# Patient Record
Sex: Female | Born: 1985 | Race: Black or African American | Hispanic: No | Smoking: Never smoker
Health system: Southern US, Community
[De-identification: ages and names within clinical notes are randomized; demographics above are authoritative.]

## PROBLEM LIST (undated history)

## (undated) ENCOUNTER — Inpatient Hospital Stay (HOSPITAL_COMMUNITY): Payer: Self-pay

## (undated) ENCOUNTER — Inpatient Hospital Stay (HOSPITAL_COMMUNITY): Payer: Medicaid Other

## (undated) DIAGNOSIS — F419 Anxiety disorder, unspecified: Secondary | ICD-10-CM

## (undated) DIAGNOSIS — F32A Depression, unspecified: Secondary | ICD-10-CM

## (undated) DIAGNOSIS — T1490XA Injury, unspecified, initial encounter: Secondary | ICD-10-CM

## (undated) DIAGNOSIS — R42 Dizziness and giddiness: Secondary | ICD-10-CM

## (undated) DIAGNOSIS — I10 Essential (primary) hypertension: Secondary | ICD-10-CM

## (undated) DIAGNOSIS — E039 Hypothyroidism, unspecified: Secondary | ICD-10-CM

## (undated) DIAGNOSIS — R87629 Unspecified abnormal cytological findings in specimens from vagina: Secondary | ICD-10-CM

## (undated) DIAGNOSIS — F329 Major depressive disorder, single episode, unspecified: Secondary | ICD-10-CM

## (undated) DIAGNOSIS — N920 Excessive and frequent menstruation with regular cycle: Secondary | ICD-10-CM

## (undated) DIAGNOSIS — R51 Headache: Secondary | ICD-10-CM

## (undated) DIAGNOSIS — S92901A Unspecified fracture of right foot, initial encounter for closed fracture: Secondary | ICD-10-CM

## (undated) DIAGNOSIS — O139 Gestational [pregnancy-induced] hypertension without significant proteinuria, unspecified trimester: Secondary | ICD-10-CM

## (undated) DIAGNOSIS — Z8742 Personal history of other diseases of the female genital tract: Secondary | ICD-10-CM

## (undated) DIAGNOSIS — Z8759 Personal history of other complications of pregnancy, childbirth and the puerperium: Secondary | ICD-10-CM

## (undated) DIAGNOSIS — S8291XA Unspecified fracture of right lower leg, initial encounter for closed fracture: Secondary | ICD-10-CM

## (undated) DIAGNOSIS — F4310 Post-traumatic stress disorder, unspecified: Secondary | ICD-10-CM

## (undated) HISTORY — DX: Injury, unspecified, initial encounter: T14.90XA

## (undated) HISTORY — DX: Post-traumatic stress disorder, unspecified: F43.10

## (undated) HISTORY — DX: Anxiety disorder, unspecified: F41.9

## (undated) HISTORY — PX: LAPAROSCOPIC GASTRIC SLEEVE RESECTION: SHX5895

## (undated) HISTORY — PX: TOOTH EXTRACTION: SUR596

## (undated) HISTORY — DX: Headache: R51

## (undated) HISTORY — DX: Depression, unspecified: F32.A

## (undated) HISTORY — PX: COMBINED LAPAROSCOPY W/ HYSTEROSCOPY: SUR299

## (undated) HISTORY — DX: Dizziness and giddiness: R42

## (undated) HISTORY — DX: Major depressive disorder, single episode, unspecified: F32.9

---
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
## 2005-03-15 ENCOUNTER — Emergency Department (HOSPITAL_COMMUNITY): Admission: EM | Admit: 2005-03-15 | Discharge: 2005-03-15 | Payer: Self-pay | Admitting: Emergency Medicine

## 2006-02-05 HISTORY — PX: DILATION AND CURETTAGE OF UTERUS: SHX78

## 2006-03-01 ENCOUNTER — Ambulatory Visit (HOSPITAL_COMMUNITY): Admission: RE | Admit: 2006-03-01 | Discharge: 2006-03-01 | Payer: Self-pay | Admitting: Obstetrics & Gynecology

## 2006-04-11 ENCOUNTER — Encounter (INDEPENDENT_AMBULATORY_CARE_PROVIDER_SITE_OTHER): Payer: Self-pay | Admitting: Specialist

## 2006-04-11 ENCOUNTER — Ambulatory Visit (HOSPITAL_COMMUNITY): Admission: RE | Admit: 2006-04-11 | Discharge: 2006-04-11 | Payer: Self-pay | Admitting: Obstetrics & Gynecology

## 2007-06-26 ENCOUNTER — Emergency Department (HOSPITAL_COMMUNITY): Admission: EM | Admit: 2007-06-26 | Discharge: 2007-06-26 | Payer: Self-pay | Admitting: Family Medicine

## 2007-07-04 ENCOUNTER — Emergency Department (HOSPITAL_COMMUNITY): Admission: EM | Admit: 2007-07-04 | Discharge: 2007-07-04 | Payer: Self-pay | Admitting: Emergency Medicine

## 2008-02-13 ENCOUNTER — Emergency Department (HOSPITAL_COMMUNITY): Admission: EM | Admit: 2008-02-13 | Discharge: 2008-02-13 | Payer: Self-pay | Admitting: Emergency Medicine

## 2008-07-21 ENCOUNTER — Emergency Department (HOSPITAL_COMMUNITY): Admission: EM | Admit: 2008-07-21 | Discharge: 2008-07-21 | Payer: Self-pay | Admitting: Family Medicine

## 2009-01-17 ENCOUNTER — Other Ambulatory Visit: Payer: Self-pay | Admitting: Emergency Medicine

## 2009-01-18 ENCOUNTER — Ambulatory Visit: Payer: Self-pay | Admitting: Psychiatry

## 2009-01-18 ENCOUNTER — Inpatient Hospital Stay (HOSPITAL_COMMUNITY): Admission: EM | Admit: 2009-01-18 | Discharge: 2009-01-21 | Payer: Self-pay | Admitting: Psychiatry

## 2009-02-06 ENCOUNTER — Other Ambulatory Visit: Payer: Self-pay | Admitting: Emergency Medicine

## 2009-02-06 ENCOUNTER — Inpatient Hospital Stay (HOSPITAL_COMMUNITY): Admission: AD | Admit: 2009-02-06 | Discharge: 2009-02-10 | Payer: Self-pay | Admitting: Psychiatry

## 2010-03-11 ENCOUNTER — Emergency Department (HOSPITAL_COMMUNITY): Payer: Medicaid Other

## 2010-03-11 ENCOUNTER — Emergency Department (HOSPITAL_COMMUNITY)
Admission: EM | Admit: 2010-03-11 | Discharge: 2010-03-12 | Disposition: A | Discharge status: Home / Self Care | Payer: Medicaid Other | Attending: Emergency Medicine | Admitting: Emergency Medicine

## 2010-03-11 DIAGNOSIS — S92309A Fracture of unspecified metatarsal bone(s), unspecified foot, initial encounter for closed fracture: Secondary | ICD-10-CM | POA: Insufficient documentation

## 2010-03-11 DIAGNOSIS — R51 Headache: Secondary | ICD-10-CM | POA: Insufficient documentation

## 2010-03-11 DIAGNOSIS — S60229A Contusion of unspecified hand, initial encounter: Secondary | ICD-10-CM | POA: Insufficient documentation

## 2010-03-11 DIAGNOSIS — F319 Bipolar disorder, unspecified: Secondary | ICD-10-CM | POA: Insufficient documentation

## 2010-03-11 DIAGNOSIS — S060X9A Concussion with loss of consciousness of unspecified duration, initial encounter: Secondary | ICD-10-CM | POA: Insufficient documentation

## 2010-04-23 LAB — COMPREHENSIVE METABOLIC PANEL
ALT: 24 U/L (ref 0–35)
AST: 30 U/L (ref 0–37)
Alkaline Phosphatase: 65 U/L (ref 39–117)
BUN: 4 mg/dL — ABNORMAL LOW (ref 6–23)
CO2: 24 mEq/L (ref 19–32)
Chloride: 107 mEq/L (ref 96–112)
Creatinine, Ser: 0.59 mg/dL (ref 0.4–1.2)
GFR calc Af Amer: >60 mL/min (ref >60)
Glucose, Bld: 103 mg/dL — ABNORMAL HIGH (ref 70–99)
Potassium: 3.6 mEq/L (ref 3.5–5.1)
Total Protein: 7.5 g/dL (ref 6.0–8.3)

## 2010-04-23 LAB — URINALYSIS, ROUTINE W REFLEX MICROSCOPIC
Protein, ur: 30 mg/dL — AB
pH: 6 (ref 5.0–8.0)

## 2010-04-23 LAB — RAPID URINE DRUG SCREEN, HOSP PERFORMED
Barbiturates: NOT DETECTED
Cocaine: NOT DETECTED

## 2010-04-23 LAB — DIFFERENTIAL
Basophils Absolute: 0.1 10*3/uL (ref 0.0–0.1)
Basophils Relative: 1 % (ref 0–1)
Eosinophils Absolute: 0.1 10*3/uL (ref 0.0–0.7)
Lymphs Abs: 1.8 10*3/uL (ref 0.7–4.0)
Monocytes Absolute: 0.8 10*3/uL (ref 0.1–1.0)
Neutro Abs: 6 10*3/uL (ref 1.7–7.7)
Neutrophils Relative %: 68 % (ref 43–77)

## 2010-04-23 LAB — CBC
Hemoglobin: 11.7 g/dL — ABNORMAL LOW (ref 12.0–15.0)
MCHC: 31.9 g/dL (ref 30.0–36.0)
Platelets: 328 10*3/uL (ref 150–400)

## 2010-04-23 LAB — POCT PREGNANCY, URINE: Preg Test, Ur: NEGATIVE

## 2010-04-23 LAB — URINE MICROSCOPIC-ADD ON

## 2010-04-23 LAB — TRICYCLICS SCREEN, URINE: TCA Scrn: NOT DETECTED

## 2010-04-24 ENCOUNTER — Emergency Department (HOSPITAL_COMMUNITY)
Admission: EM | Admit: 2010-04-24 | Discharge: 2010-04-25 | Disposition: A | Discharge status: Home / Self Care | Payer: Medicaid Other | Attending: Emergency Medicine | Admitting: Emergency Medicine

## 2010-04-24 ENCOUNTER — Emergency Department (HOSPITAL_COMMUNITY): Payer: Medicaid Other

## 2010-04-24 DIAGNOSIS — Z4789 Encounter for other orthopedic aftercare: Secondary | ICD-10-CM | POA: Insufficient documentation

## 2010-04-24 DIAGNOSIS — F319 Bipolar disorder, unspecified: Secondary | ICD-10-CM | POA: Insufficient documentation

## 2010-04-24 DIAGNOSIS — M79609 Pain in unspecified limb: Secondary | ICD-10-CM | POA: Insufficient documentation

## 2010-05-09 LAB — COMPREHENSIVE METABOLIC PANEL WITH GFR
ALT: 23 U/L (ref 0–35)
AST: 21 U/L (ref 0–37)
Albumin: 3.9 g/dL (ref 3.5–5.2)
Alkaline Phosphatase: 61 U/L (ref 39–117)
BUN: 7 mg/dL (ref 6–23)
CO2: 24 meq/L (ref 19–32)
Calcium: 9.4 mg/dL (ref 8.4–10.5)
Chloride: 104 meq/L (ref 96–112)
Creatinine, Ser: 0.51 mg/dL (ref 0.4–1.2)
GFR calc non Af Amer: >60 mL/min
Glucose, Bld: 97 mg/dL (ref 70–99)
Potassium: 3.5 meq/L (ref 3.5–5.1)
Sodium: 135 meq/L (ref 135–145)
Total Bilirubin: 0.8 mg/dL (ref 0.3–1.2)
Total Protein: 7.8 g/dL (ref 6.0–8.3)

## 2010-05-09 LAB — RAPID URINE DRUG SCREEN, HOSP PERFORMED
Amphetamines: NOT DETECTED
Barbiturates: NOT DETECTED
Benzodiazepines: NOT DETECTED
Cocaine: NOT DETECTED
Opiates: POSITIVE — AB
Tetrahydrocannabinol: NOT DETECTED

## 2010-05-09 LAB — URINALYSIS, ROUTINE W REFLEX MICROSCOPIC
Bilirubin Urine: NEGATIVE
Glucose, UA: NEGATIVE mg/dL
Hgb urine dipstick: NEGATIVE
Nitrite: NEGATIVE
Protein, ur: NEGATIVE mg/dL
Specific Gravity, Urine: 1.026 (ref 1.005–1.030)
Urobilinogen, UA: 1 mg/dL (ref 0.0–1.0)
pH: 6 (ref 5.0–8.0)

## 2010-05-09 LAB — ETHANOL

## 2010-05-09 LAB — CBC
HCT: 37.8 % (ref 36.0–46.0)
Hemoglobin: 12.1 g/dL (ref 12.0–15.0)
MCHC: 32 g/dL (ref 30.0–36.0)
MCV: 72.3 fL — ABNORMAL LOW (ref 78.0–100.0)
Platelets: 301 K/uL (ref 150–400)
RBC: 5.23 MIL/uL — ABNORMAL HIGH (ref 3.87–5.11)
RDW: 15.4 % (ref 11.5–15.5)
WBC: 7.6 K/uL (ref 4.0–10.5)

## 2010-05-09 LAB — DIFFERENTIAL
Basophils Absolute: 0 10*3/uL (ref 0.0–0.1)
Basophils Relative: 0 % (ref 0–1)
Eosinophils Absolute: 0.1 10*3/uL (ref 0.0–0.7)
Lymphocytes Relative: 21 % (ref 12–46)

## 2010-05-09 LAB — SALICYLATE LEVEL: Salicylate Lvl: <4 mg/dL (ref 2.8–20.0)

## 2010-05-09 LAB — ACETAMINOPHEN LEVEL

## 2010-05-09 LAB — TRICYCLICS SCREEN, URINE: TCA Scrn: NOT DETECTED

## 2010-05-09 LAB — PREGNANCY, URINE: Preg Test, Ur: NEGATIVE

## 2010-05-09 LAB — SYPHILIS: RPR W/REFLEX TO RPR TITER AND TREPONEMAL ANTIBODIES, TRADITIONAL SCREENING AND DIAGNOSIS ALGORITHM: RPR Ser Ql: NONREACTIVE

## 2010-05-15 LAB — WET PREP, GENITAL: Yeast Wet Prep HPF POC: NONE SEEN

## 2010-05-15 LAB — POCT PREGNANCY, URINE: Preg Test, Ur: NEGATIVE

## 2010-05-15 LAB — POCT URINALYSIS DIP (DEVICE)
Bilirubin Urine: NEGATIVE
Glucose, UA: NEGATIVE mg/dL

## 2010-06-23 NOTE — H&P (Signed)
Danielle [name], Danielle [name]               ACCOUNT NO.:  [id number]   MEDICAL RECORD NO.:  [id number]          PATIENT TYPE:  AMB   LOCATION:  SDC                           [facility]ACILITY:  [facility]   PHYSICIAN:  Roseanna Rainbow, M.D.DATE OF BIRTH:  07-Apr-1985   DATE OF ADMISSION:  DATE OF DISCHARGE:                              HISTORY & PHYSICAL   CHIEF COMPLAINT:  The patient is a 25 year old, para 0 with secondary  dysmenorrhea and a thickened endometrial stripe on ultrasound who  presents for [facility] hysteroscopy and diagnostic laparoscopy with possible  peritoneal biopsies.   HISTORY OF PRESENT ILLNESS:  Please see the above. Workup to date has  included a pelvic ultrasound on January 25 that again demonstrated the  endometrium to be heterogenous and measuring 17.3 mm.   ALLERGIES:  LATEX.   PAST MEDICAL HISTORY:  Seasonal allergies, eczema, migraine headaches,  urinary tract infections.   FAMILY HISTORY:  Noncontributory.   MEDICATIONS:  Please see the medication reconciliation form.   PAST SURGICAL HISTORY:  She denies.   REVIEW OF SYSTEMS:  GU:  Please see the above.   PHYSICAL EXAMINATION:  VITAL SIGNS:  Stable, afebrile.  GENERAL:  Well-developed, well-nourished, no apparent distress.  LUNGS: Clear to auscultation bilaterally.  HEART:  Regular rate and rhythm.  ABDOMEN:  Soft, nontender, no organomegaly.  PELVIC:  The external female genitalia are normal appearing. On speculum  exam, the vagina is clean, the cervix is without lesions. Bimanual exam,  the uterus is small, anteverted, nontender. The adnexa are without  masses, nontender bilaterally.  EXTREMITIES:  No clubbing, cyanosis or edema.  SKIN:  Without rash.   ASSESSMENT:  1. Secondary dysmenorrhea. Differential diagnosis includes possible      endometriosis.  2. Thickened heterogeneous endometrium. Differential diagnoses include      possible endometrial polyp, hyperplasia, very low suspicion for      neoplastic  process.   PLAN:  The planned procedures are diagnostic laparoscopy with possible  peritoneal biopsies and D&C hysteroscopy. The risks, benefits, and  alternative forms of management were reviewed with the patient and  informed consent had been obtained.      Roseanna Rainbow, M.D.  Electronically Signed     LAJ/MEDQ  D:  04/10/2006  T:  04/10/2006  Job:  [id number]

## 2010-06-23 NOTE — Discharge Summary (Signed)
NAME:  Danielle [name], Danielle [name]                         ACCOUNT NO.:  [id number]   MEDICAL RECORD NO.:  [id number]                   PATIENT TYPE:  IPS   LOCATION:  0107                                 [facility]   PHYSICIAN:  Cindie Crumbly, MD                 DATE OF BIRTH:  07-09-1985   DATE OF ADMISSION:  09/22/2001  DATE OF DISCHARGE:  09/25/2001                                 DISCHARGE SUMMARY   REASON FOR ADMISSION:  This 25 year old African-American female was admitted  complaining of depression status post overdose with 35 Naprosyn tablets as a  suicide attempt.  For further history of present illness, please see the  patient's psychiatric admission assessment.   PHYSICAL EXAMINATION:  Physical examination at the time of admission was  remarkable only for a history of irregular menses and migraine headaches.  She had an otherwise unremarkable physical examination.   LABORATORY DATA:  TSH was 1.114, T3 uptake was 42.3.  Urine probe for  gonorrhea and Chlamydia are pending at the time of discharge.  CBC showed  hemoglobin 10.9, hematocrit 32.0, MCV 71.3, and an unremarkable  differential.  Basic metabolic panel showed BUN 5 and was otherwise  unremarkable.  Hepatic panel was within normal limits.  GGT was within  normal limits.  Urine drug screen was negative.  Urine pregnancy test was  negative.  UA was unremarkable.  RPR was nonreactive.  EKG showed an  nonspecific ST-T wave changes and was otherwise unremarkable.  The patient  received no x-rays, no special procedures, no additional consultation.  She  sustained no complications during the course of this hospitalization.   HOSPITAL COURSE:  The patient was offered a trial of antidepressant  medication but both she and her mother refused any antidepressant medicines.  At the time of discharge and throughout her hospital course, she has denied  any homicidal or suicidal ideation, her affect and mood have improved over  the  course of the past several days.  On admission, she was depressed and  irritable.  At the time of discharge, she is actively participating in all  aspects of the therapeutic treatment program, is motivated for outpatient  therapy, and consequently is felt to have reached her maximum benefits of  hospitalization and is ready for discharge to a less restrictive alternative  setting.   CONDITION ON DISCHARGE:  Her condition on discharge is improved.   DIAGNOSES:  Diagnoses according to DSM-IV:  Axis I:     1. Major depression, single episode, severe without psychosis.  1. Rule out posttraumatic stress disorder.  Axis II:    1. Rule out personality disorder, not otherwise specified.  1. Rule out learning disorder, not otherwise specified.  Axis III:   1. Migraine headaches.  1. History of allergic rhinitis.  2. Rule out iron-deficiency anemia.  Axis IV:    Current psychosocial stressors are severe.  Axis V:  20 on admission, 30 on discharge.   FURTHER EVALUATION AND TREATMENT RECOMMENDATIONS:  1. The patient is discharged to home.  2. She is discharged on an unrestricted level of activity and a regular     diet.  3. She will follow-up with her primary care physician for a workup to rule     out iron-deficiency anemia.  4.     She will follow-up with her outpatient therapist, Dr. Judithann Sauger, for     all further aspects of her psychiatric care and consequently, I will sign     off on the case at this time.  5. The patient is discharged on no medications.                                                Cindie Crumbly, MD    TS/MEDQ  D:  09/25/2001  T:  09/27/2001  Job:  [id number]
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
## 2010-06-23 NOTE — Op Note (Signed)
[name], Danielle               ACCOUNT NO.:  [id number]   MEDICAL RECORD NO.:  [id number]          PATIENT TYPE:  AMB   LOCATION:  SDC                           [facility]  [facility]   PHYSICIAN:  Roseanna Rainbow, M.D.DATE OF BIRTH:  1985-04-12   DATE OF PROCEDURE:  04/11/2006  DATE OF DISCHARGE:                               OPERATIVE REPORT   PREOPERATIVE DIAGNOSES:  1. Secondary dysmenorrhea.  2. Thickened endometrial stripe on ultrasound.   POSTOPERATIVE DIAGNOSES:  1. Secondary dysmenorrhea.  2. Thickened endometrial stripe on ultrasound.  3. Endometriosis.   PROCEDURE:  Diagnostic laparoscopy with peritoneal biopsies, diagnostic  hysteroscopy, and dilatation and curettage.   SURGEON:  Roseanna Rainbow, M.D.   ANESTHESIA:  General endotracheal.   PATHOLOGY:  Peritoneal biopsies, endometrial curettings.   ESTIMATED BLOOD LOSS:  Minimal.   COMPLICATIONS:  None.   FINDINGS:  Upon survey of the abdominal-pelvic viscera at laparoscopy,  the appendix was normal-appearing.  The anterior cul-de-sac and ovarian  fossae were normal-appearing as well; however, upon visualization of the  posterior cul-de-sac, the left uterosacral ligament was involved in  dense scarring.  There was also scarring of the posterior cul-de-sac.  There was scant blood pooling in the posterior cul-de-sac as well as  what appeared to be endometrial tissue.  Several biopsies were taken of  the posterior cul-de-sac peritoneum.  Upon inspection of the endometrial  cavity at the time of hysteroscopy, the endometrium appeared very lush.  The right tubal ostium was well-visualized.  The left tubal ostium could  not be seen well.  There were no discrete lesions.   PROCEDURES:  The patient was taken to the operating room with an IV  running.  She was given general anesthesia and placed in the dorsal  lithotomy position and prepped and draped in the usual sterile fashion.  An infraumbilical skin  incision was then made with the scalpel.  The  Veress needle was then introduced into the abdomen while tenting up the  anterior abdominal wall at a 45 degree angle.  Intra-abdominal placement  was confirmed by a saline drop test and an appropriate low pressure  reading upon insufflation of the abdomen with CO2 gas.  The Veress  needle was then removed.  A trocar and sleeve was then advanced into the  abdomen, where appropriate placement was confirmed with the laparoscope.  The above findings were noted.  The peritoneal biopsies were taken.  The  instruments were then removed from the abdomen.  The skin was  reapproximated with interrupted sutures of 3-0 Vicryl and Dermabond.  Please note that prior to this portion of the procedure, the Hulka  manipulator had been placed into the uterine cavity and secured to the  anterior lip of the cervix as a means to manipulate the uterus.  This  was then removed.  A speculum was placed in the patient's vagina.  The  anterior lip of the cervix was grasped with a single-tooth tenaculum.  The cervix was then dilated with [facility] dilators.  A diagnostic  hysteroscopy was then performed with the above findings noted.  This was  followed by sharp curettage.  Moderate curettings were retrieved.  The  single-tooth tenaculum was then removed from the cervix.  The speculum  was removed from the vagina.  At the close of the procedure the  instrument and pack counts were said to be correct x2.  The patient was  taken to the PACU awake and in stable condition.      Roseanna Rainbow, M.D.  Electronically Signed     LAJ/MEDQ  D:  04/11/2006  T:  04/11/2006  Job:  [id number]

## 2010-09-07 ENCOUNTER — Emergency Department (HOSPITAL_COMMUNITY): Payer: Medicaid Other

## 2010-09-07 ENCOUNTER — Emergency Department (HOSPITAL_COMMUNITY)
Admission: EM | Admit: 2010-09-07 | Discharge: 2010-09-07 | Payer: Medicaid Other | Source: Home / Self Care | Attending: Emergency Medicine | Admitting: Emergency Medicine

## 2010-09-07 DIAGNOSIS — R109 Unspecified abdominal pain: Secondary | ICD-10-CM | POA: Insufficient documentation

## 2010-09-07 DIAGNOSIS — F411 Generalized anxiety disorder: Secondary | ICD-10-CM | POA: Insufficient documentation

## 2010-09-07 DIAGNOSIS — Z79899 Other long term (current) drug therapy: Secondary | ICD-10-CM | POA: Insufficient documentation

## 2010-09-07 DIAGNOSIS — F319 Bipolar disorder, unspecified: Secondary | ICD-10-CM | POA: Insufficient documentation

## 2010-09-07 DIAGNOSIS — IMO0002 Reserved for concepts with insufficient information to code with codable children: Secondary | ICD-10-CM | POA: Insufficient documentation

## 2010-09-07 DIAGNOSIS — M542 Cervicalgia: Secondary | ICD-10-CM | POA: Insufficient documentation

## 2010-09-07 LAB — CBC
MCH: 22.5 pg — ABNORMAL LOW (ref 26.0–34.0)
MCV: 68.2 fL — ABNORMAL LOW (ref 78.0–100.0)
Platelets: 295 10*3/uL (ref 150–400)
RDW: 15.6 % — ABNORMAL HIGH (ref 11.5–15.5)
WBC: 9.4 10*3/uL (ref 4.0–10.5)

## 2010-09-07 LAB — COMPREHENSIVE METABOLIC PANEL
ALT: 24 U/L (ref 0–35)
Alkaline Phosphatase: 84 U/L (ref 39–117)
BUN: 4 mg/dL — ABNORMAL LOW (ref 6–23)
CO2: 22 mEq/L (ref 19–32)
GFR calc Af Amer: >60 mL/min (ref >60)
GFR calc non Af Amer: >60 mL/min (ref >60)
Glucose, Bld: 100 mg/dL — ABNORMAL HIGH (ref 70–99)
Potassium: 3.2 mEq/L — ABNORMAL LOW (ref 3.5–5.1)
Sodium: 139 mEq/L (ref 135–145)
Total Bilirubin: 0.4 mg/dL (ref 0.3–1.2)

## 2010-09-07 LAB — URINALYSIS, ROUTINE W REFLEX MICROSCOPIC
Bilirubin Urine: NEGATIVE
Leukocytes, UA: NEGATIVE
Nitrite: NEGATIVE
Specific Gravity, Urine: 1.007 (ref 1.005–1.030)
pH: 6.5 (ref 5.0–8.0)

## 2010-09-07 LAB — DIFFERENTIAL
Basophils Absolute: 0 10*3/uL (ref 0.0–0.1)
Lymphs Abs: 2.3 10*3/uL (ref 0.7–4.0)
Monocytes Absolute: 0.8 10*3/uL (ref 0.1–1.0)
Monocytes Relative: 9 % (ref 3–12)
Neutrophils Relative %: 67 % (ref 43–77)

## 2010-09-07 LAB — POCT PREGNANCY, URINE: Preg Test, Ur: NEGATIVE

## 2010-09-07 LAB — URINE MICROSCOPIC-ADD ON

## 2010-09-07 LAB — RAPID URINE DRUG SCREEN, HOSP PERFORMED: Opiates: NOT DETECTED

## 2010-09-08 ENCOUNTER — Inpatient Hospital Stay (HOSPITAL_COMMUNITY)
Admission: AD | Admit: 2010-09-08 | Discharge: 2010-09-14 | DRG: 885 | Disposition: A | Discharge status: Home / Self Care | Payer: Medicaid Other | Attending: Psychiatry | Admitting: Psychiatry

## 2010-09-08 DIAGNOSIS — N809 Endometriosis, unspecified: Secondary | ICD-10-CM

## 2010-09-08 DIAGNOSIS — Z91040 Latex allergy status: Secondary | ICD-10-CM

## 2010-09-08 DIAGNOSIS — F411 Generalized anxiety disorder: Secondary | ICD-10-CM

## 2010-09-08 DIAGNOSIS — F39 Unspecified mood [affective] disorder: Principal | ICD-10-CM

## 2010-09-08 DIAGNOSIS — IMO0002 Reserved for concepts with insufficient information to code with codable children: Secondary | ICD-10-CM

## 2010-09-09 DIAGNOSIS — F259 Schizoaffective disorder, unspecified: Secondary | ICD-10-CM

## 2010-09-09 DIAGNOSIS — F319 Bipolar disorder, unspecified: Secondary | ICD-10-CM
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
## 2010-11-01 LAB — POCT I-STAT, CHEM 8
BUN: 6
Calcium, Ion: 1.22
Chloride: 106
Creatinine, Ser: 0.6
Glucose, Bld: 90
HCT: 38
Hemoglobin: 12.9
Potassium: 3.6
Sodium: 140
TCO2: 24

## 2011-01-04 ENCOUNTER — Encounter: Payer: Self-pay | Admitting: Emergency Medicine

## 2011-01-04 ENCOUNTER — Emergency Department (HOSPITAL_COMMUNITY)
Admission: EM | Admit: 2011-01-04 | Discharge: 2011-01-04 | Disposition: A | Discharge status: Home / Self Care | Payer: Medicaid Other | Source: Home / Self Care | Attending: Family Medicine | Admitting: Family Medicine

## 2011-01-04 DIAGNOSIS — N739 Female pelvic inflammatory disease, unspecified: Secondary | ICD-10-CM

## 2011-01-04 LAB — POCT URINALYSIS DIP (DEVICE)
Nitrite: NEGATIVE
Protein, ur: 30 mg/dL — AB
Specific Gravity, Urine: 1.025 (ref 1.005–1.030)
Urobilinogen, UA: 1 mg/dL (ref 0.0–1.0)

## 2011-01-04 LAB — WET PREP, GENITAL

## 2011-01-04 MED ORDER — CEFTRIAXONE SODIUM 250 MG IJ SOLR
250.0000 mg | Freq: Once | INTRAMUSCULAR | Status: AC
  Administered 2011-01-04: 250 mg via INTRAMUSCULAR

## 2011-01-04 MED ORDER — CEFTRIAXONE SODIUM 250 MG IJ SOLR
INTRAMUSCULAR | Status: AC
  Filled 2011-01-04: qty 250

## 2011-01-04 MED ORDER — METRONIDAZOLE 500 MG PO TABS: 500.0000 mg | ORAL_TABLET | Freq: Two times a day (BID) | ORAL | Status: DC

## 2011-01-04 MED ORDER — DOXYCYCLINE HYCLATE 100 MG PO TABS: 100.0000 mg | ORAL_TABLET | Freq: Two times a day (BID) | ORAL | Status: AC

## 2011-01-04 NOTE — ED Notes (Signed)
Pt here with vag yellow d/c with odor and lower abdominal cramping that started x 3wks ago.pt also reports to spotting intermitt.lmp 12/06/10

## 2011-01-04 NOTE — ED Provider Notes (Signed)
History     CSN: [id number] Arrival date & time: 01/04/2011 10:03 AM   First MD Initiated Contact with Patient 01/04/11 1013      No chief complaint on file.   (Consider location/radiation/quality/duration/timing/severity/associated sxs/prior treatment) HPI Comments: Danielle [name] presents for evaluation for vaginal pain, discharge, abdominal pain. She reports an unprotected sexual encounter several weeks ago with a known acquaintance but new sexual partner. She also reports a hx of endometriosis with abdominal pain but reports that this is different pain. She reports internal vaginal pain.  Patient is a 25 y.o. female presenting with female genitourinary complaint. The history is provided by the patient.  Female GU Problem Primary symptoms include discharge, pelvic pain, dyspareunia and genital pain.  Primary symptoms include no genital lesions and no genital rash. There has been no fever. This is a new problem. The current episode started more than 1 week ago. The problem occurs constantly. The problem has not changed since onset.She has missed her period. Her LMP is unknown. The patient's menstrual history has been irregular. The discharge was white. Associated symptoms include abdominal pain and nausea. Pertinent negatives include no constipation, no diarrhea, no vomiting and no frequency. She has tried nothing for the symptoms. Sexual activity: sexually active. There is a concern regarding sexually transmitted diseases. She uses nothing for contraception.    No past medical history on file.  No past surgical history on file.  No family history on file.  History  Substance Use Topics  . Smoking status: Not on file  . Smokeless tobacco: Not on file  . Alcohol Use: Not on file    OB History    No data available      Review of Systems  Constitutional: Negative.   HENT: Negative.   Eyes: Negative.   Respiratory: Negative.   Cardiovascular: Negative.   Gastrointestinal: Positive for  nausea and abdominal pain. Negative for vomiting, diarrhea and constipation.  Genitourinary: Positive for menstrual problem, pelvic pain and dyspareunia. Negative for frequency, hematuria, flank pain and genital sores.  Musculoskeletal: Negative.   Skin: Negative.     Allergies  Review of patient's allergies indicates not on file.  Home Medications  No current outpatient prescriptions on file.  BP 146/77  Pulse 89  Temp(Src) 98.3 F (36.8 C) (Oral)  Resp 18  SpO2 100%  Physical Exam  Nursing note and vitals reviewed. Constitutional: She is oriented to person, place, and time. She appears well-developed and well-nourished.  HENT:  Head: Normocephalic and atraumatic.  Eyes: EOM are normal.  Neck: Normal range of motion.  Pulmonary/Chest: Effort normal.  Genitourinary: Pelvic exam was performed with patient prone. There is no rash, tenderness or lesion on the right labia. There is no rash, tenderness or lesion on the left labia. Uterus is deviated. Cervix exhibits motion tenderness. Cervix exhibits no discharge and no friability. Right adnexum displays no mass, no tenderness and no fullness. Left adnexum displays tenderness. Left adnexum displays no mass and no fullness. There is tenderness around the vagina. No erythema or bleeding around the vagina. No foreign body around the vagina. No signs of injury around the vagina. Vaginal discharge found.  Neurological: She is alert and oriented to person, place, and time.  Skin: Skin is warm and dry.    ED Course  Procedures (including critical care time)  Labs Reviewed  POCT URINALYSIS DIP (DEVICE) - Abnormal; Notable for the following:    Hgb urine dipstick TRACE (*)    pH 8.5 (*)  Protein, ur 30 (*)    Leukocytes, UA LARGE (*) Biochemical Testing Only. Please order routine urinalysis from main lab if confirmatory testing is needed.   All other components within normal limits  POCT PREGNANCY, URINE  POCT URINALYSIS DIPSTICK    POCT PREGNANCY, URINE  GC/CHLAMYDIA PROBE AMP, GENITAL  WET PREP, GENITAL   No results found.   No diagnosis found.    MDM  Labs reviewed; GC and chlamydia pending        Richardo Priest, MD 01/04/11 1226

## 2011-01-05 LAB — GC/CHLAMYDIA PROBE AMP, GENITAL: GC Probe Amp, Genital: NEGATIVE

## 2011-01-05 NOTE — ED Notes (Signed)
Labs and medications reviewed. Pt. adequately treated with Flagyl. Message sent to Dr. Juanetta Gosling about any further tx. needed for yeast. Vassie Moselle ,01/05/2011

## 2011-01-08 ENCOUNTER — Telehealth (HOSPITAL_COMMUNITY): Payer: Self-pay | Admitting: *Deleted

## 2011-01-09 ENCOUNTER — Telehealth (HOSPITAL_COMMUNITY): Payer: Self-pay | Admitting: *Deleted

## 2011-02-26 ENCOUNTER — Ambulatory Visit (HOSPITAL_COMMUNITY): Payer: Medicaid Other | Admitting: Psychology

## 2011-03-07 ENCOUNTER — Ambulatory Visit (HOSPITAL_COMMUNITY): Payer: Medicaid Other | Admitting: Psychology

## 2011-05-16 ENCOUNTER — Inpatient Hospital Stay (HOSPITAL_COMMUNITY)
Admission: AD | Admit: 2011-05-16 | Discharge: 2011-05-16 | Disposition: A | Discharge status: Home / Self Care | Payer: Medicaid Other | Source: Ambulatory Visit | Attending: Obstetrics & Gynecology | Admitting: Obstetrics & Gynecology

## 2011-05-16 ENCOUNTER — Encounter (HOSPITAL_COMMUNITY): Payer: Self-pay | Admitting: *Deleted

## 2011-05-16 DIAGNOSIS — N803 Endometriosis of pelvic peritoneum, unspecified: Secondary | ICD-10-CM | POA: Insufficient documentation

## 2011-05-16 DIAGNOSIS — R109 Unspecified abdominal pain: Secondary | ICD-10-CM | POA: Insufficient documentation

## 2011-05-16 DIAGNOSIS — N809 Endometriosis, unspecified: Secondary | ICD-10-CM

## 2011-05-16 DIAGNOSIS — N938 Other specified abnormal uterine and vaginal bleeding: Secondary | ICD-10-CM | POA: Insufficient documentation

## 2011-05-16 DIAGNOSIS — N949 Unspecified condition associated with female genital organs and menstrual cycle: Secondary | ICD-10-CM | POA: Insufficient documentation

## 2011-05-16 LAB — URINE MICROSCOPIC-ADD ON

## 2011-05-16 LAB — URINALYSIS, ROUTINE W REFLEX MICROSCOPIC
Glucose, UA: NEGATIVE mg/dL
Protein, ur: NEGATIVE mg/dL
Urobilinogen, UA: 0.2 mg/dL (ref 0.0–1.0)

## 2011-05-16 MED ORDER — NAPROXEN 500 MG PO TABS: 500.0000 mg | ORAL_TABLET | Freq: Two times a day (BID) | ORAL | Status: AC | PRN

## 2011-05-16 MED ORDER — KETOROLAC TROMETHAMINE 60 MG/2ML IM SOLN
60.0000 mg | Freq: Once | INTRAMUSCULAR | Status: AC
  Administered 2011-05-16: 60 mg via INTRAMUSCULAR
  Filled 2011-05-16: qty 2

## 2011-05-16 NOTE — MAU Provider Note (Signed)
History     CSN: [id number]  Arrival date and time: 05/16/11 1710   First Provider Initiated Contact with Patient 05/16/11 1813      Chief Complaint  Patient presents with  . Vaginal Bleeding  . Abdominal Pain   HPI This is a 26 year old female with a history of endometriosis. The patient has been evaluated in her gynecologist's office in the past for this. She began having severe cramps and vaginal bleeding last night which she states its worst it's been in the past several months. The pain is located in her pelvis centrally and to the sides. Heat improves the pain. Lying on her stomach makes the pain worse. She reportedly called her doctor's office who instructed her to come to the MAU for evaluation. She denies vaginal discharge.  OB History    Grav Para Term Preterm Abortions TAB SAB Ect Mult Living   1 1 1  0 0 0 0 0 0 1      Past Medical History  Diagnosis Date  . Endometriosis     Past Surgical History  Procedure Date  . Dilation and curettage of uterus 2008  . Vaginal delivery     X 1    Family History  Problem Relation Age of Onset  . Anesthesia problems Neg Hx     History  Substance Use Topics  . Smoking status: Never Smoker   . Smokeless tobacco: Never Used  . Alcohol Use: Yes     occas. social    Allergies: No Known Allergies  No prescriptions prior to admission    Review of Systems  Constitutional: Negative for fever and chills.  Gastrointestinal: Negative for nausea, vomiting, diarrhea, constipation and blood in stool.  Genitourinary: Negative for dysuria, urgency, frequency and hematuria.   Physical Exam   Blood pressure 131/79, pulse 93, temperature 97.6 F (36.4 C), temperature source Oral, resp. rate 16, height 5' 4.5" (1.638 m), weight 108.228 kg (238 lb 9.6 oz), last menstrual period 05/15/2011, SpO2 100.00%.  Physical Exam  Constitutional: She is oriented to person, place, and time. She appears well-developed and well-nourished.    HENT:  Head: Normocephalic and atraumatic.  GI: Soft. She exhibits no distension and no mass. There is no tenderness. There is no rebound and no guarding.  Neurological: She is alert and oriented to person, place, and time.  Skin: Skin is warm and dry. No rash noted. No erythema. No pallor.  Psychiatric: She has a normal mood and affect. Her behavior is normal. Judgment and thought content normal.   Results for orders placed during the hospital encounter of 05/16/11 (from the past 24 hour(s))  URINALYSIS, ROUTINE W REFLEX MICROSCOPIC     Status: Abnormal   Collection Time   05/16/11  5:35 PM      Component Value Range   Color, Urine YELLOW  YELLOW    APPearance CLEAR  CLEAR    Specific Gravity, Urine >1.030 (*) 1.005 - 1.030    pH 6.0  5.0 - 8.0    Glucose, UA NEGATIVE  NEGATIVE (mg/dL)   Hgb urine dipstick LARGE (*) NEGATIVE    Bilirubin Urine NEGATIVE  NEGATIVE    Ketones, ur NEGATIVE  NEGATIVE (mg/dL)   Protein, ur NEGATIVE  NEGATIVE (mg/dL)   Urobilinogen, UA 0.2  0.0 - 1.0 (mg/dL)   Nitrite NEGATIVE  NEGATIVE    Leukocytes, UA NEGATIVE  NEGATIVE   URINE MICROSCOPIC-ADD ON     Status: Abnormal   Collection Time  05/16/11  5:35 PM      Component Value Range   Squamous Epithelial / LPF RARE  RARE    WBC, UA 0-2  <3 (WBC/hpf)   RBC / HPF 21-50  <3 (RBC/hpf)   Bacteria, UA FEW (*) RARE    Urine-Other MUCOUS PRESENT    POCT PREGNANCY, URINE     Status: Normal   Collection Time   05/16/11  6:07 PM      Component Value Range   Preg Test, Ur NEGATIVE  NEGATIVE      MAU Course  Procedures  MDM Patient given 1 dose of Toradol 60mg  IM with good reduction of her pain.  No evidence of UTI or pregnancy.  Assessment and Plan  1.  Endometriosis Gave prescription of naprosyn bid prn pain with instructions to follow up with [facility] if symptoms continue.  Kniyah Khun JEHIEL 05/16/2011, 6:14 PM

## 2011-05-16 NOTE — Discharge Instructions (Signed)
Endometriosis Endometriosis is a disease that occurs when the endometrium (lining of the uterus) is misplaced outside of its normal location. It may occur in many locations close to the uterus (womb), but commonly on the ovaries, fallopian tubes, vagina (birth canal) and bowel located close to the uterus. Because the uterus sloughs (expels) its lining every month (menses), there is bleeding whereever the endometrial tissue is located. SYMPTOMS  Often there are no symptoms. However, because blood is irritating to tissues not normally exposed to it, when symptoms occur they vary with the location of the misplaced endometrium. Symptoms often include back and abdominal pain. Periods may be heavier and intercourse may be painful. Infertility may be present. You may have all of these symptoms at one time or another or you may have months with no symptoms at all. Although the symptoms occur mainly during menses, they can occur mid-cycle as well, and usually terminate with menopause. DIAGNOSIS  Your caregiver may recommend a blood test and urine test (urinalysis) to help rule out other conditions. Another common test is ultrasound, a painless procedure that uses sound waves to make a sonogram "picture" of abnormal tissue that could be endometriosis. If your bowel movements are painful around your periods, your caregiver may advise a barium enema (an X-ray of the lower bowel), to try to find the source of your pain. This is sometimes confirmed by laparoscopy. Laparoscopy is a procedure where your caregiver looks into your abdomen with a laparoscope (a small pencil sized telescope). Your caregiver may take a tiny piece of tissue (biopsy) from any abnormal tissue to confirm or document your problem. These tissues are sent to the lab and a pathologist looks at them under the microscope to give a microscopic diagnosis. TREATMENT  Once the diagnosis is made, it can be treated by destruction of the misplaced endometrial  tissue using heat (diathermy), laser, cutting (excision), or chemical means. It may also be treated with hormonal therapy. When using hormonal therapy menses are eliminated, therefore eliminating the monthly exposure to blood by the misplaced endometrial tissue. Only in severe cases is it necessary to perform a hysterectomy with removal of the tubes, uterus and ovaries. HOME CARE INSTRUCTIONS   Only take over-the-counter or prescription medicines for pain, discomfort, or fever as directed by your caregiver.   Avoid activities that produce pain, including a physical sexual relationship.   Do not take aspirin as this may increase bleeding when not on hormonal therapy.   See your caregiver for pain or problems not controlled with treatment.  SEEK IMMEDIATE MEDICAL CARE IF:   Your pain is severe and is not responding to pain medicine.   You develop severe nausea and vomiting, or you cannot keep foods down.   Your pain localizes to the right lower part of your abdomen (possible appendicitis).   You have swelling or increasing pain in the abdomen.   You have a fever.   You see blood in your stool.  MAKE SURE YOU:   Understand these instructions.   Will watch your condition.   Will get help right away if you are not doing well or get worse.  Document Released: 01/20/2000 Document Revised: 01/11/2011 Document Reviewed: 09/10/2007 [employer] Patient Information 2012 [employer]. 

## 2011-05-16 NOTE — MAU Note (Signed)
Patient states she has a history of endometriosis and has had to have a D & E and laparoscopy, last time in 2009. Started bleeding last night and having severe abdominal cramping.

## 2011-07-05 ENCOUNTER — Other Ambulatory Visit: Payer: Self-pay | Admitting: Nurse Practitioner

## 2011-07-05 DIAGNOSIS — N8000 Endometriosis of the uterus, unspecified: Secondary | ICD-10-CM

## 2011-07-05 DIAGNOSIS — N949 Unspecified condition associated with female genital organs and menstrual cycle: Secondary | ICD-10-CM

## 2011-07-09 ENCOUNTER — Ambulatory Visit (HOSPITAL_COMMUNITY)
Admission: RE | Admit: 2011-07-09 | Discharge: 2011-07-09 | Disposition: A | Discharge status: Home / Self Care | Payer: Medicaid Other | Source: Ambulatory Visit | Attending: Nurse Practitioner | Admitting: Nurse Practitioner

## 2011-07-09 DIAGNOSIS — N949 Unspecified condition associated with female genital organs and menstrual cycle: Secondary | ICD-10-CM

## 2011-07-09 DIAGNOSIS — N8000 Endometriosis of the uterus, unspecified: Secondary | ICD-10-CM

## 2011-07-09 DIAGNOSIS — N938 Other specified abnormal uterine and vaginal bleeding: Secondary | ICD-10-CM | POA: Insufficient documentation

## 2012-04-03 ENCOUNTER — Encounter (HOSPITAL_COMMUNITY): Payer: Self-pay | Admitting: *Deleted

## 2012-04-03 ENCOUNTER — Other Ambulatory Visit (HOSPITAL_COMMUNITY)
Admission: RE | Admit: 2012-04-03 | Discharge: 2012-04-03 | Disposition: A | Discharge status: Home / Self Care | Payer: PRIVATE HEALTH INSURANCE | Source: Ambulatory Visit | Attending: Emergency Medicine | Admitting: Emergency Medicine

## 2012-04-03 ENCOUNTER — Emergency Department (INDEPENDENT_AMBULATORY_CARE_PROVIDER_SITE_OTHER)
Admission: EM | Admit: 2012-04-03 | Discharge: 2012-04-03 | Disposition: A | Discharge status: Home / Self Care | Payer: Self-pay | Source: Home / Self Care | Attending: Emergency Medicine | Admitting: Emergency Medicine

## 2012-04-03 DIAGNOSIS — N760 Acute vaginitis: Secondary | ICD-10-CM | POA: Insufficient documentation

## 2012-04-03 DIAGNOSIS — N9089 Other specified noninflammatory disorders of vulva and perineum: Secondary | ICD-10-CM

## 2012-04-03 DIAGNOSIS — Z113 Encounter for screening for infections with a predominantly sexual mode of transmission: Secondary | ICD-10-CM | POA: Insufficient documentation

## 2012-04-03 DIAGNOSIS — R109 Unspecified abdominal pain: Secondary | ICD-10-CM

## 2012-04-03 LAB — POCT URINALYSIS DIP (DEVICE)
Bilirubin Urine: NEGATIVE
Glucose, UA: NEGATIVE mg/dL
Nitrite: NEGATIVE
Urobilinogen, UA: 0.2 mg/dL (ref 0.0–1.0)

## 2012-04-03 MED ORDER — VALACYCLOVIR HCL 1 G PO TABS: 1000.0000 mg | ORAL_TABLET | Freq: Two times a day (BID) | ORAL | Status: DC

## 2012-04-03 MED ORDER — CEFTRIAXONE SODIUM 250 MG IJ SOLR
250.0000 mg | Freq: Once | INTRAMUSCULAR | Status: AC
  Administered 2012-04-03: 250 mg via INTRAMUSCULAR

## 2012-04-03 MED ORDER — LIDOCAINE HCL (PF) 1 % IJ SOLN
INTRAMUSCULAR | Status: AC
  Filled 2012-04-03: qty 5

## 2012-04-03 MED ORDER — AZITHROMYCIN 250 MG PO TABS
ORAL_TABLET | ORAL | Status: AC
  Filled 2012-04-03: qty 4

## 2012-04-03 MED ORDER — AZITHROMYCIN 250 MG PO TABS
1000.0000 mg | ORAL_TABLET | Freq: Every day | ORAL | Status: DC
  Administered 2012-04-03: 1000 mg via ORAL

## 2012-04-03 MED ORDER — CEFTRIAXONE SODIUM 250 MG IJ SOLR
INTRAMUSCULAR | Status: AC
  Filled 2012-04-03: qty 250

## 2012-04-03 NOTE — ED Notes (Signed)
Pt reports lower abdomen pain with spotting that started 2 days ago.request female dr.

## 2012-04-03 NOTE — ED Provider Notes (Signed)
Medical screening examination/treatment/procedure(s) were performed by non-physician practitioner and as supervising physician I was immediately available for consultation/collaboration.  Leslee Home, M.D.  Reuben Likes, MD 04/03/12 2113

## 2012-04-03 NOTE — ED Provider Notes (Signed)
History     CSN: [id number]  Arrival date & time 04/03/12  1305   First MD Initiated Contact with Patient 04/03/12 1337      Chief Complaint  Patient presents with  . Abdominal Pain    (Consider location/radiation/quality/duration/timing/severity/associated sxs/prior treatment) HPI Comments: Pt had unprotected sex last week, now has lesion on labia that is concerning to her. Also c/o abd pain for 4 days.   Patient is a 27 y.o. female presenting with abdominal pain. The history is provided by the patient.  Abdominal Pain Pain location:  Suprapubic Pain quality: aching   Pain radiates to:  Does not radiate Pain severity:  Moderate Onset quality:  Gradual Timing:  Constant Progression:  Unchanged Chronicity:  New Relieved by:  None tried Worsened by:  Nothing tried Ineffective treatments:  None tried Associated symptoms: no chills, no constipation, no diarrhea, no dysuria, no fever, no hematuria, no nausea, no vaginal bleeding, no vaginal discharge and no vomiting     Past Medical History  Diagnosis Date  . Endometriosis     Past Surgical History  Procedure Laterality Date  . Dilation and curettage of uterus  2008  . Vaginal delivery      X 1    Family History  Problem Relation Age of Onset  . Anesthesia problems Neg Hx     History  Substance Use Topics  . Smoking status: Never Smoker   . Smokeless tobacco: Never Used  . Alcohol Use: Yes     Comment: occas. social    OB History   Grav Para Term Preterm Abortions TAB SAB Ect Mult Living   1 1 1  0 0 0 0 0 0 1      Review of Systems  Constitutional: Negative for fever and chills.  Gastrointestinal: Positive for abdominal pain. Negative for nausea, vomiting, diarrhea, constipation and blood in stool.  Genitourinary: Positive for genital sores. Negative for dysuria, urgency, hematuria, flank pain, vaginal bleeding, vaginal discharge, vaginal pain and pelvic pain.    Allergies  Review of patient's  allergies indicates no known allergies.  Home Medications   Current Outpatient Rx  Name  Route  Sig  Dispense  Refill  . naproxen (NAPROSYN) 500 MG tablet   Oral   Take 1 tablet (500 mg total) by mouth 2 (two) times daily as needed (pain).   30 tablet   0   . valACYclovir (VALTREX) 1000 MG tablet   Oral   Take 1 tablet (1,000 mg total) by mouth 2 (two) times daily.   20 tablet   0     BP 147/84  Pulse 90  Temp(Src) 97.9 F (36.6 C) (Oral)  Resp 20  SpO2 100%  LMP 03/20/2012  Physical Exam  Constitutional: She appears well-developed and well-nourished. No distress.  Cardiovascular: Normal rate and regular rhythm.   Pulmonary/Chest: Effort normal and breath sounds normal.  Abdominal: Soft. Bowel sounds are normal. There is tenderness in the suprapubic area. There is no rigidity, no rebound, no guarding and no CVA tenderness.  Genitourinary:    There is no lesion or injury on the right labia. There is lesion on the left labia. There is no injury on the left labia. Uterus is enlarged and tender. Cervix exhibits discharge. Cervix exhibits no motion tenderness. Right adnexum displays no mass and no tenderness. Left adnexum displays no mass and no tenderness. No tenderness or bleeding around the vagina. Vaginal discharge found.  Lymphadenopathy:       Right: No  inguinal adenopathy present.       Left: No inguinal adenopathy present.    ED Course  Procedures (including critical care time)  Labs Reviewed  POCT URINALYSIS DIP (DEVICE) - Abnormal; Notable for the following:    Protein, ur 30 (*)    All other components within normal limits  HERPES SIMPLEX VIRUS CULTURE  POCT PREGNANCY, URINE  CERVICOVAGINAL ANCILLARY ONLY   No results found.   1. Labial lesion   2. Abdominal pain       MDM  Tx empirically for gc/chlamydia and given rx for valtrex while herpes culture pending. Abd pain seems to be uterine and uterus seems enlarged to me. Pt to f/u with gyn at  [facility].          Cathlyn Parsons, NP 04/03/12 1510  Cathlyn Parsons, NP 04/03/12 1511

## 2012-04-07 LAB — HERPES SIMPLEX VIRUS CULTURE

## 2012-05-05 ENCOUNTER — Ambulatory Visit: Payer: Self-pay | Admitting: Obstetrics & Gynecology

## 2012-05-07 ENCOUNTER — Ambulatory Visit: Payer: Self-pay | Admitting: Obstetrics & Gynecology

## 2012-08-06 ENCOUNTER — Encounter: Payer: Self-pay | Admitting: *Deleted

## 2012-08-15 ENCOUNTER — Encounter: Payer: Medicaid Other | Admitting: Obstetrics & Gynecology

## 2012-08-26 ENCOUNTER — Encounter: Payer: Medicaid Other | Admitting: Obstetrics & Gynecology

## 2012-10-23 ENCOUNTER — Emergency Department (HOSPITAL_COMMUNITY): Payer: Self-pay

## 2012-10-23 ENCOUNTER — Emergency Department (HOSPITAL_COMMUNITY)
Admission: EM | Admit: 2012-10-23 | Discharge: 2012-10-23 | Disposition: A | Discharge status: Home / Self Care | Payer: Medicaid Other | Attending: Emergency Medicine | Admitting: Emergency Medicine

## 2012-10-23 ENCOUNTER — Emergency Department (HOSPITAL_COMMUNITY): Payer: Medicaid Other

## 2012-10-23 ENCOUNTER — Encounter (HOSPITAL_COMMUNITY): Payer: Self-pay | Admitting: *Deleted

## 2012-10-23 DIAGNOSIS — Z91040 Latex allergy status: Secondary | ICD-10-CM | POA: Insufficient documentation

## 2012-10-23 DIAGNOSIS — Z8659 Personal history of other mental and behavioral disorders: Secondary | ICD-10-CM | POA: Insufficient documentation

## 2012-10-23 DIAGNOSIS — R1110 Vomiting, unspecified: Secondary | ICD-10-CM | POA: Insufficient documentation

## 2012-10-23 DIAGNOSIS — N83209 Unspecified ovarian cyst, unspecified side: Secondary | ICD-10-CM | POA: Insufficient documentation

## 2012-10-23 DIAGNOSIS — R197 Diarrhea, unspecified: Secondary | ICD-10-CM | POA: Insufficient documentation

## 2012-10-23 DIAGNOSIS — N898 Other specified noninflammatory disorders of vagina: Secondary | ICD-10-CM | POA: Insufficient documentation

## 2012-10-23 LAB — COMPREHENSIVE METABOLIC PANEL
ALT: 23 U/L (ref 0–35)
BUN: 11 mg/dL (ref 6–23)
CO2: 25 mEq/L (ref 19–32)
Calcium: 9.8 mg/dL (ref 8.4–10.5)
GFR calc Af Amer: >90 mL/min (ref >90)
GFR calc non Af Amer: >90 mL/min (ref >90)
Glucose, Bld: 105 mg/dL — ABNORMAL HIGH (ref 70–99)
Sodium: 137 mEq/L (ref 135–145)

## 2012-10-23 LAB — CBC WITH DIFFERENTIAL/PLATELET
Basophils Relative: 0 % (ref 0–1)
Eosinophils Absolute: 0.1 10*3/uL (ref 0.0–0.7)
HCT: 37.7 % (ref 36.0–46.0)
Hemoglobin: 12.1 g/dL (ref 12.0–15.0)
Lymphs Abs: 1.7 10*3/uL (ref 0.7–4.0)
MCH: 22.7 pg — ABNORMAL LOW (ref 26.0–34.0)
MCHC: 32.1 g/dL (ref 30.0–36.0)
MCV: 70.7 fL — ABNORMAL LOW (ref 78.0–100.0)
Monocytes Absolute: 1 10*3/uL (ref 0.1–1.0)
Neutro Abs: 6.8 10*3/uL (ref 1.7–7.7)

## 2012-10-23 LAB — WET PREP, GENITAL: Yeast Wet Prep HPF POC: NONE SEEN

## 2012-10-23 MED ORDER — ONDANSETRON HCL 4 MG/2ML IJ SOLN
4.0000 mg | Freq: Once | INTRAMUSCULAR | Status: AC
  Administered 2012-10-23: 4 mg via INTRAVENOUS
  Filled 2012-10-23: qty 2

## 2012-10-23 MED ORDER — SODIUM CHLORIDE 0.9 % IV BOLUS (SEPSIS)
1000.0000 mL | Freq: Once | INTRAVENOUS | Status: AC
  Administered 2012-10-23: 1000 mL via INTRAVENOUS

## 2012-10-23 MED ORDER — HYDROCODONE-ACETAMINOPHEN 5-325 MG PO TABS: 1.0000 | ORAL_TABLET | Freq: Four times a day (QID) | ORAL | Status: DC | PRN

## 2012-10-23 MED ORDER — ONDANSETRON HCL 4 MG PO TABS: 4.0000 mg | ORAL_TABLET | Freq: Four times a day (QID) | ORAL | Status: DC

## 2012-10-23 MED ORDER — MORPHINE SULFATE 4 MG/ML IJ SOLN
4.0000 mg | Freq: Once | INTRAMUSCULAR | Status: AC
  Administered 2012-10-23: 4 mg via INTRAVENOUS
  Filled 2012-10-23: qty 1

## 2012-10-23 NOTE — ED Notes (Signed)
Pt states "endometrial" pain since Friday, became worse yesterday. Vomiting. Last vomited at 0700

## 2012-10-23 NOTE — ED Provider Notes (Signed)
CSN: [id number]     Arrival date & time 10/23/12  0945 History  This chart was scribed for Danielle Churn, MD by Quintella Reichert, ED scribe.  This patient was seen in room APA18/APA18 and the patient's care was started at 10:14 AM.   Chief Complaint  Patient presents with  . Abdominal Pain    Patient is a 27 y.o. female presenting with abdominal pain. The history is provided by the patient. No language interpreter was used.  Abdominal Pain Pain location:  Generalized Pain quality: sharp   Pain radiates to:  Back Pain severity:  Severe Onset quality:  Gradual Duration:  6 days Timing:  Constant Progression:  Worsening Chronicity:  Recurrent Relieved by:  OTC medications Worsened by:  Nothing tried Associated symptoms: diarrhea, vaginal bleeding and vomiting   Risk factors comment:  Endometriosis   HPI Comments: Danielle [name] is a 27 y.o. female with h/o endometriosis who presents to the Emergency Department complaining of generalized abdominal pain that began 6 days ago and became severe today, with associated emesis, diarrhea and vaginal bleeding.  Pt states she knows that her pain is caused by her endometriosis.  Pain is described as generalized sharp throbbing abdominal pain radiating to her back, "like contractions but worse."  She has attempted to treat pain with a heating pad, without relief.  She notes that she was medicating with hydrocodone and ibuprofen 800mg  with some relief, but she ran out of these yesterday.  She states she has been using one tampon and one pad per hour.  Pt notes that she has not seen her OB/GYN since last year because she lost her insurance.    Past Medical History  Diagnosis Date  . Endometriosis   . Depression   . Dizzy spells   . Headache(784.0)   . PTSD (post-traumatic stress disorder)   . Anxiety     Past Surgical History  Procedure Laterality Date  . Dilation and curettage of uterus  2008  . Vaginal delivery      X 1  . Tooth  extraction      Family History  Problem Relation Age of Onset  . Anesthesia problems Neg Hx   . Diabetes Maternal Grandmother   . Diabetes Maternal Grandfather   . Diabetes Paternal Grandmother   . Diabetes Paternal Grandfather   . Other Cousin     sickle cell disease  . Arthritis Other   . Asthma Other   . Early death Other     uncle-suicide  . Cataracts Other   . Congestive Heart Failure Other   . Hyperlipidemia Other   . Hypertension Other   . Stroke Other   . Migraines Other     History  Substance Use Topics  . Smoking status: Never Smoker   . Smokeless tobacco: Never Used  . Alcohol Use: Yes     Comment: occas. social    OB History   Grav Para Term Preterm Abortions TAB SAB Ect Mult Living   2 1 1  0 1 0 1 0 0 1      Review of Systems  Gastrointestinal: Positive for vomiting, abdominal pain and diarrhea.  Genitourinary: Positive for vaginal bleeding.  All other systems reviewed and are negative.     Allergies  Latex  Home Medications   Current Outpatient Rx  Name  Route  Sig  Dispense  Refill  . Multiple Vitamin (MULTIVITAMIN WITH MINERALS) TABS tablet   Oral   Take 1 tablet by mouth  daily.          BP 150/77  Pulse 90  Temp(Src) 98.1 F (36.7 C) (Oral)  SpO2 100%  LMP 10/23/2012  Physical Exam  Nursing note and vitals reviewed. Constitutional: She is oriented to person, place, and time. She appears well-developed and well-nourished. No distress.  HENT:  Head: Normocephalic and atraumatic.  Mouth/Throat: Oropharynx is clear and moist. No oropharyngeal exudate.  Eyes: Conjunctivae are normal. Pupils are equal, round, and reactive to light. No scleral icterus.  Neck: Neck supple.  Cardiovascular: Normal rate, regular rhythm, normal heart sounds and intact distal pulses.   No murmur heard. Pulmonary/Chest: Effort normal and breath sounds normal. No stridor. No respiratory distress. She has no wheezes. She has no rales.  Abdominal: Soft.  Bowel sounds are normal. She exhibits no distension and no mass. There is tenderness (LLQ). There is no rebound and no guarding.  Genitourinary: There is no rash or tenderness on the right labia. There is no rash or tenderness on the left labia. Uterus is not tender. Cervix exhibits no motion tenderness and no friability. Right adnexum displays tenderness. Right adnexum displays no mass. Left adnexum displays no mass and no tenderness. There is bleeding around the vagina.  Musculoskeletal: Normal range of motion.  Neurological: She is alert and oriented to person, place, and time.  Skin: Skin is warm and dry. No rash noted.  Psychiatric: She has a normal mood and affect. Her behavior is normal.    ED Course  Procedures (including critical care time)  DIAGNOSTIC STUDIES: Oxygen Saturation is 100% on room air, normal by my interpretation.    COORDINATION OF CARE: 10:21 AM-Discussed treatment plan which includes pelvic exam with pt at bedside and pt agreed to plan.    Labs Review Labs Reviewed  WET PREP, GENITAL - Abnormal; Notable for the following:    WBC, Wet Prep HPF POC RARE (*)    All other components within normal limits  CBC WITH DIFFERENTIAL - Abnormal; Notable for the following:    RBC 5.33 (*)    MCV 70.7 (*)    MCH 22.7 (*)    All other components within normal limits  COMPREHENSIVE METABOLIC PANEL - Abnormal; Notable for the following:    Glucose, Bld 105 (*)    All other components within normal limits  GC/CHLAMYDIA PROBE AMP    Imaging Review US Transvaginal Non-ob  10/23/2012   ADDENDUM REPORT: 10/23/2012 13:40  ADDENDUM: Color Doppler flow is noted within both ovaries.   Electronically Signed   By: Charlett Nose M.D.   On: 10/23/2012 13:40   10/23/2012   CLINICAL DATA:  Abdominal pain.  The  EXAM: TRANSABDOMINAL AND TRANSVAGINAL ULTRASOUND OF PELVIS  TECHNIQUE: Both transabdominal and transvaginal ultrasound examinations of the pelvis were performed. Transabdominal  technique was performed for global imaging of the pelvis including uterus, ovaries, adnexal regions, and pelvic cul-de-sac. It was necessary to proceed with endovaginal exam following the transabdominal exam to visualize the uterus, endometrium and ovaries.  COMPARISON:  07/09/2011  FINDINGS: Uterus  Measurements: 7.6 x 5.0 x 5.5 cm. No fibroids or other mass visualized.  Endometrium  Thickness: 8 mm.  No focal abnormality visualized.  Right ovary  Measurements: 4.2 x 2.6 x 2.3 cm. 2.7 cm complex, slightly hypoechoic heterogeneous area within the ovary. I suspect this represents a hemorrhagic cyst.  Left ovary  Measurements: 2.4 x 1.2 x 2.1 cm. Normal appearance/no adnexal mass.  Other findings  No free fluid.  IMPRESSION:  Complex area in the right ovary, likely hemorrhagic cyst measuring 2.7 cm. Otherwise unremarkable study.  Electronically Signed: By: Charlett Nose M.D. On: 10/23/2012 12:25   US Pelvis Complete  10/23/2012   ADDENDUM REPORT: 10/23/2012 13:40  ADDENDUM: Color Doppler flow is noted within both ovaries.   Electronically Signed   By: Charlett Nose M.D.   On: 10/23/2012 13:40   10/23/2012   CLINICAL DATA:  Abdominal pain.  The  EXAM: TRANSABDOMINAL AND TRANSVAGINAL ULTRASOUND OF PELVIS  TECHNIQUE: Both transabdominal and transvaginal ultrasound examinations of the pelvis were performed. Transabdominal technique was performed for global imaging of the pelvis including uterus, ovaries, adnexal regions, and pelvic cul-de-sac. It was necessary to proceed with endovaginal exam following the transabdominal exam to visualize the uterus, endometrium and ovaries.  COMPARISON:  07/09/2011  FINDINGS: Uterus  Measurements: 7.6 x 5.0 x 5.5 cm. No fibroids or other mass visualized.  Endometrium  Thickness: 8 mm.  No focal abnormality visualized.  Right ovary  Measurements: 4.2 x 2.6 x 2.3 cm. 2.7 cm complex, slightly hypoechoic heterogeneous area within the ovary. I suspect this represents a hemorrhagic cyst.   Left ovary  Measurements: 2.4 x 1.2 x 2.1 cm. Normal appearance/no adnexal mass.  Other findings  No free fluid.  IMPRESSION: Complex area in the right ovary, likely hemorrhagic cyst measuring 2.7 cm. Otherwise unremarkable study.  Electronically Signed: By: Charlett Nose M.D. On: 10/23/2012 12:25    MDM   1. Ovarian cyst    27 year old female with right-sided hemorrhagic ovarian cyst. Pain controlled with IV morphine. Able to tolerate by mouth fluids. Discharged with gynecology follow up.   I personally performed the services described in this documentation, which was scribed in my presence. The recorded information has been reviewed and is accurate.     Danielle Churn, MD 10/23/12 [phone]

## 2012-10-24 LAB — GC/CHLAMYDIA PROBE AMP
CT Probe RNA: NEGATIVE
GC Probe RNA: NEGATIVE

## 2012-11-10 ENCOUNTER — Inpatient Hospital Stay (HOSPITAL_COMMUNITY)
Admission: AD | Admit: 2012-11-10 | Discharge: 2012-11-10 | Disposition: A | Discharge status: Home / Self Care | Payer: PRIVATE HEALTH INSURANCE | Source: Ambulatory Visit | Attending: Obstetrics & Gynecology | Admitting: Obstetrics & Gynecology

## 2012-11-10 NOTE — MAU Note (Signed)
Pt taken to room after registration since she had argument with registration staff.  In room pt was talking about various subjects and unable to focus on my intake questions.  Asking for phone telling me she cannot talk to me now, she needs to know why her issues of vaginal bleeding and abd pain are not being taken seriously.  She recites her trips to [facility] facilities and her MDs, also says she needs to have her child back, on phone telling someone she will be going to [employer] today and tell everything.  Walking back and forth, on hospital computer, writing on board.  Asked for pregnancy test, to BR, and then back to room.  Able only to obtain BP and no intake questions.  Pt at desk asking for something to eat, told after the evaluation of her complaint of vaginal bleeding, she may be allowed to eat. Pt states she is going to her car now to calm down, offered of help to calm down refused.  Walked out of room in gown with jacket over her gown

## 2013-07-03 ENCOUNTER — Encounter (HOSPITAL_COMMUNITY): Payer: Self-pay | Admitting: *Deleted

## 2013-07-03 ENCOUNTER — Emergency Department (HOSPITAL_COMMUNITY)
Admission: EM | Admit: 2013-07-03 | Discharge: 2013-07-03 | Disposition: A | Discharge status: Home / Self Care | Payer: Medicaid Other | Source: Home / Self Care | Attending: Emergency Medicine | Admitting: Emergency Medicine

## 2013-07-03 ENCOUNTER — Inpatient Hospital Stay (HOSPITAL_COMMUNITY)
Admission: AD | Admit: 2013-07-03 | Discharge: 2013-07-04 | Disposition: A | Discharge status: Home / Self Care | Payer: Medicaid Other | Source: Ambulatory Visit | Attending: Obstetrics & Gynecology | Admitting: Obstetrics & Gynecology

## 2013-07-03 ENCOUNTER — Encounter (HOSPITAL_COMMUNITY): Payer: Self-pay | Admitting: Emergency Medicine

## 2013-07-03 DIAGNOSIS — L309 Dermatitis, unspecified: Secondary | ICD-10-CM

## 2013-07-03 DIAGNOSIS — L259 Unspecified contact dermatitis, unspecified cause: Secondary | ICD-10-CM

## 2013-07-03 DIAGNOSIS — R21 Rash and other nonspecific skin eruption: Secondary | ICD-10-CM | POA: Insufficient documentation

## 2013-07-03 DIAGNOSIS — R109 Unspecified abdominal pain: Secondary | ICD-10-CM

## 2013-07-03 DIAGNOSIS — O9989 Other specified diseases and conditions complicating pregnancy, childbirth and the puerperium: Principal | ICD-10-CM

## 2013-07-03 DIAGNOSIS — O99891 Other specified diseases and conditions complicating pregnancy: Secondary | ICD-10-CM | POA: Insufficient documentation

## 2013-07-03 LAB — POCT URINALYSIS DIP (DEVICE)
Bilirubin Urine: NEGATIVE
GLUCOSE, UA: NEGATIVE mg/dL
Hgb urine dipstick: NEGATIVE
Ketones, ur: 15 mg/dL — AB
LEUKOCYTES UA: NEGATIVE
NITRITE: NEGATIVE
PROTEIN: 100 mg/dL — AB
Specific Gravity, Urine: >=1.03 (ref 1.005–1.030)
UROBILINOGEN UA: 0.2 mg/dL (ref 0.0–1.0)
pH: 6.5 (ref 5.0–8.0)

## 2013-07-03 NOTE — ED Provider Notes (Signed)
Chief Complaint   Chief Complaint  Patient presents with  . Urinary Tract Infection    History of Present Illness   Danielle [name] is a 28 year old female who is 8 months pregnant. She is being followed by Dr. Mora Appl in the evening. Throughout the past several months she's been treated for bacterial and yeast vaginitis. For the past 5 days she's had bilateral lower abdominal pain which has been fairly constant. She thinks this might be urinary tract infection. She's had them before when she was in high school. She denies any dysuria, urgency, or hematuria. She does have some frequency, but thinks this could be pregnancy related and she's had no fever, chills, nausea, vomiting, diarrhea, constipation, or blood in stool. She denies any vaginal bleeding or discharge. She has had a rash on her vulva which itches and feels irritated. She denies any obvious contactants in this area. There has been no vaginal bleeding.  Review of Systems   Other than as noted above, the patient denies any of the following symptoms: Constitutional:  No fever, chills, weight loss or anorexia. Abdomen:  No nausea, vomiting, hematememesis, melena, diarrhea, or hematochezia. GU:  No dysuria, frequency, urgency, or hematuria. Gyn:  No vaginal discharge, itching, abnormal bleeding, dyspareunia, or pelvic pain.  PMFSH   Past medical history, family history, social history, meds, and allergies were reviewed. Her only medication is Zoloft.  Physical Exam     Vital signs:  BP 138/78  Pulse 78  Temp(Src) 98.7 F (37.1 C) (Oral)  Resp 18  SpO2 98%  LMP 10/23/2012 Gen:  Alert, oriented, in no distress. Lungs:  Breath sounds clear and equal bilaterally.  No wheezes, rales or rhonchi. Heart:  Regular rhythm.  No gallops or murmers.   Abdomen:  She has a gravid uterus. There is some tenderness to palpation in the left lower cautery without guarding or rebound. Bowel sounds are normally active. Pelvic:  There is a  maculopapular rash on the posterior vulva bilaterally with some erosion of the skin. There were no ulcerations and nothing to suggest herpes simplex. Vaginal and cervical mucosa were normal. There was a small amount of discharge. Bimanual reveals a gravid uterus. This was mildly tender to palpation.  DNA probes for gonorrhea, Chlamydia, Trichomonas, Gardnerella, and Candida were obtained. Skin:  Clear, warm and dry.  No rash.  Labs   Results for orders placed during the hospital encounter of 07/03/13  POCT URINALYSIS DIP (DEVICE)      Result Value Ref Range   Glucose, UA NEGATIVE  NEGATIVE mg/dL   Bilirubin Urine NEGATIVE  NEGATIVE   Ketones, ur 15 (*) NEGATIVE mg/dL   Specific Gravity, Urine >=1.030  1.005 - 1.030   Hgb urine dipstick NEGATIVE  NEGATIVE   pH 6.5  5.0 - 8.0   Protein, ur 100 (*) NEGATIVE mg/dL   Urobilinogen, UA 0.2  0.0 - 1.0 mg/dL   Nitrite NEGATIVE  NEGATIVE   Leukocytes, UA NEGATIVE  NEGATIVE    Assessment   The primary encounter diagnosis was Dermatitis. A diagnosis of Abdominal pain was also pertinent to this visit.  Uterine pain in a near-term female suggest abruption or placenta previa. She'll need to have an obstetrical ultrasound to rule these out. [facility]. She is hemodynamically stable. There is no evidence of urinary tract infection.  Plan     1.  Meds:  The following meds were prescribed:   New Prescriptions   No  medications on file   Suggested 1% hydrocortisone cream to the rash on the vulva.  2.  Patient Education/Counseling:  The patient was given appropriate handouts, self care instructions, and instructed in symptomatic relief.    3.  Follow up:  The patient was told to follow up here if no better in 3 to 4 days, or sooner if becoming worse in any way, and given some red flag symptoms such as worsening pain, fever, vomiting, or evidence of GI bleeding which would prompt immediate return.  [facility] Report was called there to the mid-level provider on-call.    Reuben Likesavid C Quinlynn Cuthbert, MD 07/03/13 [phone]

## 2013-07-03 NOTE — ED Notes (Signed)
Patient complains of painful urination that started Monday; states itching and burning sensation states skin is peeling off.

## 2013-07-03 NOTE — MAU Note (Addendum)
PT SAYS SHE WENT TO URGENT CARE-   7-8PM-    DID UA, PELVIC  EXAM - NOTHING.   Francis Dowse GETS PNC  AT [facility]   IN [place]   WITH DR MCCLOUD. .  C/O FREQ IN VOIDING-  STARTED ON SAT.Marland Kitchen   CRAMPING STARTED ON MON-SHE DID NOT CALL DR.  Francis Dowse HAS AN APPOINTMENT ON   6-2.  SHE WENT TO URGENT CARE  FOR A  RASH ON PERINEUM-   HAS HAD  FOR A WHILE-  THOUGHT IT WAS FROM SHAVING-  BUT YESTERDAY  BECAME WORSE-  ITCHES-    SCRATCHES.  HURTS WHEN IN  SHOWER WASHING.  AREA IS RED  SHE SAYS.   NO SEX IN 2015.    2 MTHS AGO- STARTED WEARING UNDERWEAR    DENIES HSV   AND  MRSA.

## 2013-07-03 NOTE — Discharge Instructions (Signed)
For rash, try 1% Hydrocortisone cream applied 3 times daily.  Go to [facility] tonight.

## 2013-07-03 NOTE — MAU Note (Signed)
Went to Urgent Care cause thought I had uti or yeast infection. My urine was clean and no d/c but he sent me over for u/s to be sure everything ok. My cervix is swollen

## 2013-07-03 NOTE — MAU Provider Note (Signed)
History     CSN: [id number]  Arrival date and time: 07/03/13 2222   First Provider Initiated Contact with Patient 07/03/13 2353      Chief Complaint  Patient presents with  . Abdominal Pain  . Rash   HPI Patient is a 28 yo female G3P1011 at 34.3 weeks presenting from urgent care for evaluation of rash and abdominal cramping. Patient notes since Monday has had lower abdominal/suprapubic cramping sensation and pain in "my cervix", both of which occur intermittently. She notes these have been getting worse over the past several days. She states she thinks she may have a yeast infection or a UTI. She denies discharge, dysuria, hematuria, vaginal bleeding, LOF, and contractions. She notes good fetal movement.  She does not remember when her rash started. She notes the only change has been that she started wearing underwear a couple of months ago. She denies new soaps, detergents, fragrances, creams. She does not it worsened with shaving so she stopped shaving and the rash has improved.  When she was at urgent care they collected a UA that revealed signs of dehydration though no signs of infection. Per the urgent care note, DNA probes for gonorrhea, Chlamydia, Trichomonas, Gardnerella, and Candida were obtained.   OB History   Grav Para Term Preterm Abortions TAB SAB Ect Mult Living   3 1 1  0 1 0 1 0 0 1      Past Medical History  Diagnosis Date  . Endometriosis   . Depression   . Dizzy spells   . Headache(784.0)   . PTSD (post-traumatic stress disorder)   . Anxiety     Past Surgical History  Procedure Laterality Date  . Dilation and curettage of uterus  2008  . Vaginal delivery      X 1  . Tooth extraction      Family History  Problem Relation Age of Onset  . Anesthesia problems Neg Hx   . Diabetes Maternal Grandmother   . Diabetes Maternal Grandfather   . Diabetes Paternal Grandmother   . Diabetes Paternal Grandfather   . Other Cousin     sickle cell disease  .  Arthritis Other   . Asthma Other   . Early death Other     uncle-suicide  . Cataracts Other   . Congestive Heart Failure Other   . Hyperlipidemia Other   . Hypertension Other   . Stroke Other   . Migraines Other     History  Substance Use Topics  . Smoking status: Never Smoker   . Smokeless tobacco: Never Used  . Alcohol Use: Yes     Comment: occas. social  LAST  DRANK IN SEPT-2014    Allergies:  Allergies  Allergen Reactions  . Latex Rash    Prescriptions prior to admission  Medication Sig Dispense Refill  . Multiple Vitamin (MULTIVITAMIN WITH MINERALS) TABS tablet Take 1 tablet by mouth daily.      . sertraline (ZOLOFT) 100 MG tablet Take 100 mg by mouth daily.      . clonazePAM (KLONOPIN) 0.5 MG tablet Take 0.5 mg by mouth 3 (three) times daily as needed for anxiety.      Marland Kitchen HYDROcodone-acetaminophen (NORCO/VICODIN) 5-325 MG per tablet Take 1-2 tablets by mouth every 6 (six) hours as needed for pain.  12 tablet  0  . ondansetron (ZOFRAN) 4 MG tablet Take 1 tablet (4 mg total) by mouth every 6 (six) hours.  12 tablet  0    ROS see  HPI Physical Exam   Temperature 98.2 F (36.8 C), resp. rate 20, height 5\' 8"  (1.727 m), weight 107.684 kg (237 lb 6.4 oz), last menstrual period 10/23/2012.  Physical Exam  Constitutional: She appears well-developed and well-nourished. No distress.  Patient giggling throughout the encounter  HENT:  Head: Normocephalic and atraumatic.  Cardiovascular: Normal rate, regular rhythm and normal heart sounds.   Respiratory: Effort normal and breath sounds normal.  GI: Soft. There is tenderness (mild left lower quadrant). There is no rebound and no guarding.  Gravid   Genitourinary:  Lower portion of bilateral labia majora with hypopigmented maculopapular rash without evidence of swelling or erythema, no signs of excoriation, no signs of vesiculation or ulceration Cervix closed/thick No cervical motion tenderness noted  Musculoskeletal: She  exhibits no edema.  Neurological: She is alert.  Skin: Skin is warm and dry.   Fetal monitoring: FHR 135, moderate variability, accels present, no decels No contractions noted  MAU Course  Procedures  MDM Patient seen in the MAU with a benign abdominal exam. Has already had urine culture sent as well as cervical DNA probes as outline above. Fetal well being confirmed on monitoring. Rash noted and appears consistent with contact dermatitis. No evidence of herpes lesions. Will plan for discharge and advise hydrocortisone cream for the rash with close follow-up with OB at already scheduled appointment on 07/07/13.  Assessment and Plan  Patient is a 28 yo female G3P1011 at 34.4 weeks presenting with rash and abdominal cramping. No evidence of UTI on UA. UCx already sent. Cervical DNA probes collected at urgent care. Patient with benign exam. Abdominal cramping may be related to a BV, yeast, trich, GC, or chlamydia. Will plan to await the DNA probe results prior to treating. Rash consistent with contact dermatitis. Advised to change brand of underwear and allow groin to dry prior to putting underwear back on. Hydrocortisone cream recommended. Will discharge to follow-up with Dr Mora ApplMcleod on 07/07/13.  Glori Luisric G Sonnenberg 07/03/2013, 11:54 PM   I have seen and examined this patient and I agree with the above. Marcelle SmilingKimberly D [facility] LLChawCNM 8:53 AM 07/04/2013

## 2013-07-04 DIAGNOSIS — L259 Unspecified contact dermatitis, unspecified cause: Secondary | ICD-10-CM

## 2013-07-04 NOTE — Discharge Instructions (Signed)
Abdominal Pain During Pregnancy Belly (abdominal) pain is common during pregnancy. Most of the time, it is not a serious problem. Other times, it can be a sign that something is wrong with the pregnancy. Always tell your doctor if you have belly pain. HOME CARE Monitor your belly pain for any changes. The following actions may help you feel better:  Do not have sex (intercourse) or put anything in your vagina until you feel better.  Rest until your pain stops.  Drink clear fluids if you feel sick to your stomach (nauseous). Do not eat solid food until you feel better.  Only take medicine as told by your doctor.  Keep all doctor visits as told. GET HELP RIGHT AWAY IF:   You are bleeding, leaking fluid, or pieces of tissue come out of your vagina.  You have more pain or cramping.  You keep throwing up (vomiting).  You have pain when you pee (urinate) or have blood in your pee.  You have a fever.  You do not feel your baby moving as much.  You feel very weak or feel like passing out.  You have trouble breathing, with or without belly pain.  You have a very bad headache and belly pain.  You have fluid leaking from your vagina and belly pain.  You keep having watery poop (diarrhea).  Your belly pain does not go away after resting, or the pain gets worse. MAKE SURE YOU:   Understand these instructions.  Will watch your condition.  Will get help right away if you are not doing well or get worse. Document Released: 01/10/2009 Document Revised: 09/24/2012 Document Reviewed: 08/21/2012 [facility] Patient Information 2014 [place], [place].  You can try 1% hydrocortisone cream bought from the pharmacy for your rash.

## 2013-07-05 LAB — URINE CULTURE
Colony Count: >=100000
SPECIAL REQUESTS: NORMAL

## 2013-07-06 ENCOUNTER — Other Ambulatory Visit (HOSPITAL_COMMUNITY)
Admission: RE | Admit: 2013-07-06 | Discharge: 2013-07-06 | Disposition: A | Discharge status: Home / Self Care | Payer: Medicaid Other | Source: Ambulatory Visit | Attending: Emergency Medicine | Admitting: Emergency Medicine

## 2013-07-06 DIAGNOSIS — Z113 Encounter for screening for infections with a predominantly sexual mode of transmission: Secondary | ICD-10-CM | POA: Insufficient documentation

## 2013-07-06 DIAGNOSIS — N760 Acute vaginitis: Secondary | ICD-10-CM | POA: Insufficient documentation

## 2013-07-27 ENCOUNTER — Encounter (HOSPITAL_COMMUNITY): Payer: Self-pay

## 2013-12-07 ENCOUNTER — Encounter (HOSPITAL_COMMUNITY): Payer: Self-pay

## 2014-02-04 ENCOUNTER — Encounter: Payer: Self-pay | Admitting: Obstetrics & Gynecology

## 2014-02-05 NOTE — L&D Delivery Note (Cosign Needed)
Delivery Note At 4:23 AM a viable female was delivered via  (Presentation: vertex, OA).  APGAR: 8, 9; weight  pending.   Placenta status: spontaneous, intact.  Cord:  with the following complications: none .  Cord pH: n/a  Anesthesia:  none Episiotomy:  none Lacerations:  none Suture Repair: n/a Est. Blood Loss (mL):  150mL  Mom to postpartum.  Baby to Couplet care / Skin to Skin.  Erasmo DownerAngela M Bacigalupo, MD, MPH PGY-2,  [facility] 08/16/2014 4:39 AM

## 2014-02-11 ENCOUNTER — Encounter: Payer: Self-pay | Admitting: Family

## 2014-02-18 ENCOUNTER — Other Ambulatory Visit: Payer: Medicaid Other | Admitting: Family

## 2014-02-18 ENCOUNTER — Ambulatory Visit (INDEPENDENT_AMBULATORY_CARE_PROVIDER_SITE_OTHER): Payer: Self-pay | Admitting: Family

## 2014-02-18 ENCOUNTER — Encounter: Payer: Self-pay | Admitting: Family

## 2014-02-18 VITALS — BP 139/71 | HR 79 | Temp 98.8°F | Wt 220.8 lb

## 2014-02-18 DIAGNOSIS — Z3682 Encounter for antenatal screening for nuchal translucency: Secondary | ICD-10-CM

## 2014-02-18 DIAGNOSIS — N898 Other specified noninflammatory disorders of vagina: Secondary | ICD-10-CM

## 2014-02-18 DIAGNOSIS — O10919 Unspecified pre-existing hypertension complicating pregnancy, unspecified trimester: Secondary | ICD-10-CM | POA: Insufficient documentation

## 2014-02-18 DIAGNOSIS — O0990 Supervision of high risk pregnancy, unspecified, unspecified trimester: Secondary | ICD-10-CM

## 2014-02-18 DIAGNOSIS — Z23 Encounter for immunization: Secondary | ICD-10-CM

## 2014-02-18 DIAGNOSIS — O3680X1 Pregnancy with inconclusive fetal viability, fetus 1: Secondary | ICD-10-CM

## 2014-02-18 LAB — COMPLETE METABOLIC PANEL WITH GFR
ALBUMIN: 3.6 g/dL (ref 3.5–5.2)
ALK PHOS: 45 U/L (ref 39–117)
AST: 10 U/L (ref 0–37)
BILIRUBIN TOTAL: 0.3 mg/dL (ref 0.2–1.2)
BUN: 5 mg/dL — ABNORMAL LOW (ref 6–23)
CALCIUM: 8.9 mg/dL (ref 8.4–10.5)
CO2: 22 mEq/L (ref 19–32)
Chloride: 104 mEq/L (ref 96–112)
Creat: 0.4 mg/dL — ABNORMAL LOW (ref 0.50–1.10)
GFR, Est Non African American: >89 mL/min
Glucose, Bld: 90 mg/dL (ref 70–99)
Potassium: 4.2 mEq/L (ref 3.5–5.3)
SODIUM: 137 meq/L (ref 135–145)
TOTAL PROTEIN: 6.4 g/dL (ref 6.0–8.3)

## 2014-02-18 LAB — POCT URINALYSIS DIP (DEVICE)
BILIRUBIN URINE: NEGATIVE
GLUCOSE, UA: NEGATIVE mg/dL
HGB URINE DIPSTICK: NEGATIVE
KETONES UR: NEGATIVE mg/dL
Nitrite: NEGATIVE
PROTEIN: 30 mg/dL — AB
SPECIFIC GRAVITY, URINE: 1.025 (ref 1.005–1.030)
Urobilinogen, UA: 0.2 mg/dL (ref 0.0–1.0)
pH: 8.5 — ABNORMAL HIGH (ref 5.0–8.0)

## 2014-02-18 LAB — US OB COMP LESS 14 WKS

## 2014-02-18 LAB — OB RESULTS CONSOLE GC/CHLAMYDIA
Chlamydia: NEGATIVE
GC PROBE AMP, GENITAL: NEGATIVE

## 2014-02-18 LAB — TSH: TSH: 1.286 u[IU]/mL (ref 0.350–4.500)

## 2014-02-18 MED ORDER — LABETALOL HCL 200 MG PO TABS: 200.0000 mg | ORAL_TABLET | Freq: Two times a day (BID) | ORAL | Status: DC

## 2014-02-18 NOTE — Progress Notes (Signed)
Subjective:    Danielle [name] is a [id number] [redacted]w[redacted]d being seen today for her first obstetrical visit.  Pt had unknown LMP (bedside ultrasound by Diane Day).  Her obstetrical history is significant for chronic hypertension diagnosed as a teenager, close pregnancy spacing, depression, and current incarceration.   Currently taking metoprolol.  Patient does intend to breast feed. Pregnancy history fully reviewed.  Patient reports vaginal irritation.  Recently treated for a yeast infection and patient states the discoloration resulted shortly after that.  Denies vaginal itching, +irritation while taking a shower.    Filed Vitals:   02/18/14 0931  BP: 139/71  Pulse: 79  Temp: 98.8 F (37.1 C)  Weight: 220 lb 12.8 oz (100.154 kg)    HISTORY: OB History  Gravida Para Term Preterm AB SAB TAB Ectopic Multiple Living  0 1 1 0 0 0 2    # Outcome Date GA Lbr Len/2nd Weight Sex Delivery Anes PTL Lv  4 Current           3 Term 08/19/13 [redacted]w[redacted]d  7 lb 12 oz (3.515 kg) M  None  Y  2 SAB 2014          1 Term 10/05/03 [redacted]w[redacted]d  7 lb 12 oz (3.515 kg) M Vag-Spont None  Y     Past Medical History  Diagnosis Date  . Endometriosis   . Depression   . Dizzy spells   . Headache(784.0)   . PTSD (post-traumatic stress disorder)   . Anxiety   . Sickle cell trait    Past Surgical History  Procedure Laterality Date  . Dilation and curettage of uterus  2008  . Vaginal delivery      X 1  . Tooth extraction     Family History  Problem Relation Age of Onset  . Anesthesia problems Neg Hx   . Diabetes Maternal Grandmother   . Diabetes Maternal Grandfather   . Diabetes Paternal Grandmother   . Diabetes Paternal Grandfather   . Other Cousin     sickle cell disease  . Arthritis Other   . Asthma Other   . Early death Other     uncle-suicide  . Cataracts Other   . Congestive Heart Failure Other   . Hyperlipidemia Other   . Hypertension Other   . Stroke Other   . Migraines Other   . Diabetes  Father   . Hypertension Father     Exam   BP 139/71 mmHg  Pulse 79  Temp(Src) 98.8 F (37.1 C)  Wt 220 lb 12.8 oz (100.154 kg)  LMP  (LMP Unknown)  Breastfeeding? Unknown Uterine Size: size equals dates  Pelvic Exam:    Perineum: No Hemorrhoids, Normal Perineum   Vulva: Entire vaginal skin lightened in appearance; small ? ulcerated lesions    Vagina:  No noted lesion, lightened in appearance; ? lesions   pH: Not done   Cervix: no bleeding following Pap, no cervical motion tenderness and no lesions   Adnexa: normal adnexa and no mass, fullness, tenderness   Bony Pelvis: Adequate  System: Breast:  No nipple retraction or dimpling, No nipple discharge or bleeding, No axillary or supraclavicular adenopathy, Normal to palpation without dominant masses   Skin: normal coloration and turgor, no rashes    Neurologic: negative   Extremities: normal strength, tone, and muscle mass   HEENT neck supple with midline trachea and thyroid without masses   Mouth/Teeth mucous membranes moist, pharynx normal without lesions  Neck supple and no masses   Cardiovascular: regular rate and rhythm, no murmurs or gallops   Respiratory:  appears well, vitals normal, no respiratory distress, acyanotic, normal RR, neck free of mass or lymphadenopathy, chest clear, no wheezing, crepitations, rhonchi, normal symmetric air entry   Abdomen: soft, non-tender; bowel sounds normal; no masses,  no organomegaly   Urinary: urethral meatus normal      Assessment:    Pregnancy:   29 yo G4P2012 at 5769w4d wks IUP Vaginal Irritation  Patient Active Problem List   Diagnosis Date Noted  . Supervision of high-risk pregnancy 02/18/2014  . Chronic hypertension in pregnancy 02/18/2014        Plan:     Initial labs drawn. Baseline hypertension labs drawn Dr. Debroah LoopArnold in to assess vagina > HSV and wet prep obtained Meds:  Discontinue metoprolol and begin labetalol 200 mg BID  Prenatal vitamins. Problem list  reviewed and updated. Genetic Screening discussed First Screen: ordered. Follow up in 2 weeks.  Marlis EdelsonKARIM, WALIDAH N 02/18/2014

## 2014-02-18 NOTE — Progress Notes (Signed)
Bedside US for viability = single IUP, FHR = 148 bpm per PW doppler, FM present.  CRL= 6668w4d.  Pt reports LMP on 2014 due to endometriosis - will need formal US for size/dates.

## 2014-02-18 NOTE — Progress Notes (Signed)
Weight gain 11-20lbs Flu vaccine consented and info given New ob packet given Early glucola

## 2014-02-18 NOTE — Progress Notes (Signed)
First trimester screen 02/26/14 @ 830a.

## 2014-02-19 LAB — PRENATAL PROFILE (SOLSTAS)
ANTIBODY SCREEN: NEGATIVE
BASOS ABS: 0 10*3/uL (ref 0.0–0.1)
Basophils Relative: 0 % (ref 0–1)
Eosinophils Absolute: 0.1 10*3/uL (ref 0.0–0.7)
Eosinophils Relative: 1 % (ref 0–5)
HEMATOCRIT: 35.7 % — AB (ref 36.0–46.0)
HEP B S AG: NEGATIVE
HIV: NONREACTIVE
Hemoglobin: 11 g/dL — ABNORMAL LOW (ref 12.0–15.0)
LYMPHS PCT: 27 % (ref 12–46)
Lymphs Abs: 1.6 10*3/uL (ref 0.7–4.0)
MCH: 21.9 pg — ABNORMAL LOW (ref 26.0–34.0)
MCHC: 30.8 g/dL (ref 30.0–36.0)
MCV: 71 fL — ABNORMAL LOW (ref 78.0–100.0)
MONOS PCT: 10 % (ref 3–12)
Monocytes Absolute: 0.6 10*3/uL (ref 0.1–1.0)
NEUTROS PCT: 62 % (ref 43–77)
Neutro Abs: 3.8 10*3/uL (ref 1.7–7.7)
PLATELETS: 256 10*3/uL (ref 150–400)
RBC: 5.03 MIL/uL (ref 3.87–5.11)
RDW: 19.8 % — AB (ref 11.5–15.5)
RH TYPE: POSITIVE
RUBELLA: 1.08 {index} — AB (ref <0.90)
WBC: 6.1 10*3/uL (ref 4.0–10.5)

## 2014-02-19 LAB — PROTEIN / CREATININE RATIO, URINE
CREATININE, URINE: 188.7 mg/dL
PROTEIN CREATININE RATIO: 0.12 (ref <0.15)
TOTAL PROTEIN, URINE: 23 mg/dL (ref 5–24)

## 2014-02-19 LAB — CYTOLOGY - PAP

## 2014-02-19 LAB — WET PREP, GENITAL
Clue Cells Wet Prep HPF POC: NONE SEEN
Trich, Wet Prep: NONE SEEN
Yeast Wet Prep HPF POC: NONE SEEN

## 2014-02-20 LAB — CULTURE, OB URINE
COLONY COUNT: NO GROWTH
ORGANISM ID, BACTERIA: NO GROWTH

## 2014-02-22 LAB — CERVICOVAGINAL ANCILLARY ONLY: HERPES (WINDOWPATH): NEGATIVE

## 2014-02-24 LAB — HERPES SIMPLEX VIRUS CULTURE: Organism ID, Bacteria: NOT DETECTED

## 2014-02-24 LAB — BENZODIAZEPINES (GC/LC/MS), URINE
Alprazolam metabolite (GC/LC/MS), ur confirm: NEGATIVE ng/mL (ref <25)
Clonazepam metabolite (GC/LC/MS), ur confirm: NEGATIVE ng/mL (ref <25)
FLURAZEPAMU: NEGATIVE ng/mL (ref <50)
Lorazepam (GC/LC/MS), ur confirm: NEGATIVE ng/mL (ref <50)
Midazolam (GC/LC/MS), ur confirm: NEGATIVE ng/mL (ref <50)
NORDIAZEPAMU: NEGATIVE ng/mL (ref <50)
Oxazepam (GC/LC/MS), ur confirm: NEGATIVE ng/mL (ref <50)
Temazepam (GC/LC/MS), ur confirm: NEGATIVE ng/mL (ref <50)
Triazolam metabolite (GC/LC/MS), ur confirm: NEGATIVE ng/mL (ref <50)

## 2014-02-25 LAB — PRESCRIPTION MONITORING PROFILE (19 PANEL)
Amphetamine/Meth: NEGATIVE ng/mL
BUPRENORPHINE, URINE: NEGATIVE ng/mL
Barbiturate Screen, Urine: NEGATIVE ng/mL
CARISOPRODOL, URINE: NEGATIVE ng/mL
COCAINE METABOLITES: NEGATIVE ng/mL
CREATININE, URINE: 185.67 mg/dL (ref >20.0)
Cannabinoid Scrn, Ur: NEGATIVE ng/mL
FENTANYL URINE: NEGATIVE ng/mL
MDMA URINE: NEGATIVE ng/mL
Meperidine, Ur: NEGATIVE ng/mL
Methadone Screen, Urine: NEGATIVE ng/mL
Methaqualone: NEGATIVE ng/mL
NITRITES URINE, INITIAL: NEGATIVE ug/mL
OXYCODONE SCRN UR: NEGATIVE ng/mL
Opiate Screen, Urine: NEGATIVE ng/mL
PH URINE, INITIAL: 8 pH (ref 4.5–8.9)
Phencyclidine, Ur: NEGATIVE ng/mL
Propoxyphene: NEGATIVE ng/mL
TAPENTADOLUR: NEGATIVE ng/mL
Tramadol Scrn, Ur: NEGATIVE ng/mL
Zolpidem, Urine: NEGATIVE ng/mL

## 2014-02-26 ENCOUNTER — Ambulatory Visit (HOSPITAL_COMMUNITY): Payer: Medicaid Other

## 2014-02-26 ENCOUNTER — Ambulatory Visit (HOSPITAL_COMMUNITY): Admission: RE | Admit: 2014-02-26 | Payer: Medicaid Other | Source: Ambulatory Visit

## 2014-03-04 ENCOUNTER — Encounter: Payer: Self-pay | Admitting: Obstetrics & Gynecology

## 2014-03-04 ENCOUNTER — Telehealth (HOSPITAL_COMMUNITY): Payer: Self-pay | Admitting: *Deleted

## 2014-03-04 NOTE — Telephone Encounter (Signed)
[facility] a [facility] call from [facility] about patient needing to have her US from 1/22 for nuchal rescheduled.  They requested to have this rescheduled to the date of her next ob appointment which is February 8th. She would be too far for a nuchal at that point.  The next available appointment would be too far as well.  We notified the jail of the patient only needing to come for an ob appointment on the 8th and they will schedule her for an appointment for an US at that visit.

## 2014-03-08 ENCOUNTER — Encounter: Payer: Self-pay | Admitting: Obstetrics & Gynecology

## 2014-03-15 ENCOUNTER — Ambulatory Visit (INDEPENDENT_AMBULATORY_CARE_PROVIDER_SITE_OTHER): Payer: Self-pay | Admitting: Obstetrics & Gynecology

## 2014-03-15 ENCOUNTER — Ambulatory Visit (HOSPITAL_COMMUNITY): Admission: RE | Admit: 2014-03-15 | Payer: Self-pay | Source: Ambulatory Visit

## 2014-03-15 VITALS — BP 121/65 | HR 95 | Temp 98.3°F | Wt 215.7 lb

## 2014-03-15 DIAGNOSIS — O10912 Unspecified pre-existing hypertension complicating pregnancy, second trimester: Secondary | ICD-10-CM

## 2014-03-15 LAB — POCT URINALYSIS DIP (DEVICE)
Bilirubin Urine: NEGATIVE
GLUCOSE, UA: NEGATIVE mg/dL
HGB URINE DIPSTICK: NEGATIVE
NITRITE: NEGATIVE
Protein, ur: 30 mg/dL — AB
SPECIFIC GRAVITY, URINE: 1.025 (ref 1.005–1.030)
Urobilinogen, UA: 0.2 mg/dL (ref 0.0–1.0)
pH: 7 (ref 5.0–8.0)

## 2014-03-15 MED ORDER — ASPIRIN EC 81 MG PO TBEC
81.0000 mg | DELAYED_RELEASE_TABLET | Freq: Every day | ORAL | Status: DC
Start: 2014-03-15 — End: 2014-06-22

## 2014-03-15 NOTE — Progress Notes (Signed)
Aspirin prescribed for preeclampsia prevention Quad screen today Anatomy scan ordered No other complaints or concerns.  Routine obstetric precautions reviewed.

## 2014-03-15 NOTE — Patient Instructions (Signed)
Return to clinic for any obstetric concerns or go to [facility] for evaluation  

## 2014-03-15 NOTE — Progress Notes (Signed)
U/S 04/12/14 @ 830a with Radiology.

## 2014-03-16 LAB — AFP, QUAD SCREEN
AFP: 30.2 ng/mL
Age Alone: 1:717 {titer}
Curr Gest Age: 15.1 wks.days
HCG TOTAL: 39.3 [IU]/mL
INH: 125.5 pg/mL
Interpretation-AFP: NEGATIVE
MOM FOR AFP: 1.12
MOM FOR INH: 0.81
MoM for hCG: 0.95
Open Spina bifida: NEGATIVE
TRI 18 SCR RISK EST: NEGATIVE
Trisomy 18 (Edward) Syndrome Interp.: 1:55300 {titer}
UE3 MOM: 1.01
uE3 Value: 0.61 ng/mL

## 2014-04-10 ENCOUNTER — Inpatient Hospital Stay (HOSPITAL_COMMUNITY)
Admission: AD | Admit: 2014-04-10 | Discharge: 2014-04-10 | Discharge status: Against Medical Advice | Payer: Medicaid Other | Source: Ambulatory Visit | Attending: Family Medicine | Admitting: Family Medicine

## 2014-04-10 DIAGNOSIS — Z3A19 19 weeks gestation of pregnancy: Secondary | ICD-10-CM | POA: Insufficient documentation

## 2014-04-10 DIAGNOSIS — W19XXXA Unspecified fall, initial encounter: Secondary | ICD-10-CM | POA: Insufficient documentation

## 2014-04-10 DIAGNOSIS — O9989 Other specified diseases and conditions complicating pregnancy, childbirth and the puerperium: Secondary | ICD-10-CM | POA: Insufficient documentation

## 2014-04-10 DIAGNOSIS — Z5320 Procedure and treatment not carried out because of patient's decision for unspecified reasons: Secondary | ICD-10-CM | POA: Insufficient documentation

## 2014-04-10 DIAGNOSIS — R51 Headache: Secondary | ICD-10-CM | POA: Insufficient documentation
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
## 2014-04-12 ENCOUNTER — Encounter: Payer: Self-pay | Admitting: Obstetrics and Gynecology

## 2014-04-12 ENCOUNTER — Ambulatory Visit (HOSPITAL_COMMUNITY): Admission: RE | Admit: 2014-04-12 | Payer: Self-pay | Source: Ambulatory Visit

## 2014-04-19 ENCOUNTER — Ambulatory Visit (HOSPITAL_COMMUNITY)
Admission: RE | Admit: 2014-04-19 | Discharge: 2014-04-19 | Disposition: A | Discharge status: Home / Self Care | Payer: Medicaid Other | Source: Ambulatory Visit | Attending: Obstetrics & Gynecology | Admitting: Obstetrics & Gynecology

## 2014-04-19 DIAGNOSIS — Z36 Encounter for antenatal screening of mother: Secondary | ICD-10-CM | POA: Insufficient documentation

## 2014-04-19 DIAGNOSIS — Z3A20 20 weeks gestation of pregnancy: Secondary | ICD-10-CM | POA: Insufficient documentation

## 2014-04-19 DIAGNOSIS — D573 Sickle-cell trait: Secondary | ICD-10-CM | POA: Insufficient documentation

## 2014-04-19 DIAGNOSIS — O10012 Pre-existing essential hypertension complicating pregnancy, second trimester: Secondary | ICD-10-CM | POA: Insufficient documentation

## 2014-04-19 DIAGNOSIS — O99212 Obesity complicating pregnancy, second trimester: Secondary | ICD-10-CM | POA: Insufficient documentation

## 2014-04-19 DIAGNOSIS — O10912 Unspecified pre-existing hypertension complicating pregnancy, second trimester: Secondary | ICD-10-CM

## 2014-04-19 DIAGNOSIS — O99012 Anemia complicating pregnancy, second trimester: Secondary | ICD-10-CM | POA: Insufficient documentation

## 2014-04-20 ENCOUNTER — Encounter: Payer: Self-pay | Admitting: Family Medicine

## 2014-04-20 ENCOUNTER — Telehealth: Payer: Self-pay | Admitting: Family Medicine

## 2014-04-20 NOTE — Telephone Encounter (Signed)
Called left patient message to return call to clinic. Mailing certified letter.

## 2014-04-25 ENCOUNTER — Emergency Department (HOSPITAL_COMMUNITY)
Admission: EM | Admit: 2014-04-25 | Discharge: 2014-04-25 | Disposition: A | Discharge status: Home / Self Care | Payer: Medicaid Other | Attending: Emergency Medicine | Admitting: Emergency Medicine

## 2014-04-25 ENCOUNTER — Encounter (HOSPITAL_COMMUNITY): Payer: Self-pay | Admitting: Emergency Medicine

## 2014-04-25 DIAGNOSIS — Z7982 Long term (current) use of aspirin: Secondary | ICD-10-CM | POA: Insufficient documentation

## 2014-04-25 DIAGNOSIS — B3731 Acute candidiasis of vulva and vagina: Secondary | ICD-10-CM

## 2014-04-25 DIAGNOSIS — Z3A22 22 weeks gestation of pregnancy: Secondary | ICD-10-CM | POA: Insufficient documentation

## 2014-04-25 DIAGNOSIS — F419 Anxiety disorder, unspecified: Secondary | ICD-10-CM | POA: Insufficient documentation

## 2014-04-25 DIAGNOSIS — F4310 Post-traumatic stress disorder, unspecified: Secondary | ICD-10-CM | POA: Insufficient documentation

## 2014-04-25 DIAGNOSIS — Z862 Personal history of diseases of the blood and blood-forming organs and certain disorders involving the immune mechanism: Secondary | ICD-10-CM | POA: Insufficient documentation

## 2014-04-25 DIAGNOSIS — A6009 Herpesviral infection of other urogenital tract: Secondary | ICD-10-CM

## 2014-04-25 DIAGNOSIS — F329 Major depressive disorder, single episode, unspecified: Secondary | ICD-10-CM | POA: Insufficient documentation

## 2014-04-25 DIAGNOSIS — B373 Candidiasis of vulva and vagina: Secondary | ICD-10-CM | POA: Insufficient documentation

## 2014-04-25 DIAGNOSIS — Z91040 Latex allergy status: Secondary | ICD-10-CM | POA: Insufficient documentation

## 2014-04-25 DIAGNOSIS — O98812 Other maternal infectious and parasitic diseases complicating pregnancy, second trimester: Secondary | ICD-10-CM | POA: Insufficient documentation

## 2014-04-25 DIAGNOSIS — Z79899 Other long term (current) drug therapy: Secondary | ICD-10-CM | POA: Insufficient documentation

## 2014-04-25 DIAGNOSIS — Z3492 Encounter for supervision of normal pregnancy, unspecified, second trimester: Secondary | ICD-10-CM

## 2014-04-25 DIAGNOSIS — A630 Anogenital (venereal) warts: Secondary | ICD-10-CM

## 2014-04-25 DIAGNOSIS — Z8742 Personal history of other diseases of the female genital tract: Secondary | ICD-10-CM | POA: Insufficient documentation

## 2014-04-25 DIAGNOSIS — O99342 Other mental disorders complicating pregnancy, second trimester: Secondary | ICD-10-CM | POA: Insufficient documentation

## 2014-04-25 DIAGNOSIS — O98312 Other infections with a predominantly sexual mode of transmission complicating pregnancy, second trimester: Secondary | ICD-10-CM | POA: Insufficient documentation

## 2014-04-25 DIAGNOSIS — O98512 Other viral diseases complicating pregnancy, second trimester: Secondary | ICD-10-CM | POA: Insufficient documentation

## 2014-04-25 LAB — WET PREP, GENITAL
Clue Cells Wet Prep HPF POC: NONE SEEN
TRICH WET PREP: NONE SEEN

## 2014-04-25 MED ORDER — FLUCONAZOLE 150 MG PO TABS: 150.0000 mg | ORAL_TABLET | Freq: Every day | ORAL | Status: DC

## 2014-04-25 MED ORDER — VALACYCLOVIR HCL 1 G PO TABS: 1000.0000 mg | ORAL_TABLET | Freq: Two times a day (BID) | ORAL | Status: DC

## 2014-04-25 NOTE — Discharge Instructions (Signed)
Genital Herpes Genital herpes is a sexually transmitted disease. This means that it is a disease passed by having sex with an infected person. There is no cure for genital herpes. The time between attacks can be months to years. The virus may live in a person but produce no problems (symptoms). This infection can be passed to a baby as it travels down the birth canal (vagina). In a newborn, this can cause central nervous system damage, eye damage, or even death. The virus that causes genital herpes is usually HSV-2 virus. The virus that causes oral herpes is usually HSV-1. The diagnosis (learning what is wrong) is made through culture results. SYMPTOMS  Usually symptoms of pain and itching begin a few days to a week after contact. It first appears as small blisters that progress to small painful ulcers which then scab over and heal after several days. It affects the outer genitalia, birth canal, cervix, penis, anal area, buttocks, and thighs. HOME CARE INSTRUCTIONS   Keep ulcerated areas dry and clean.  Take medications as directed. Antiviral medications can speed up healing. They will not prevent recurrences or cure this infection. These medications can also be taken for suppression if there are frequent recurrences.  While the infection is active, it is contagious. Avoid all sexual contact during active infections.  Condoms may help prevent spread of the herpes virus.  Practice safe sex.  Wash your hands thoroughly after touching the genital area.  Avoid touching your eyes after touching your genital area.  Inform your caregiver if you have had genital herpes and become pregnant. It is your responsibility to insure a safe outcome for your baby in this pregnancy.  Only take over-the-counter or prescription medicines for pain, discomfort, or fever as directed by your caregiver. SEEK MEDICAL CARE IF:   You have a recurrence of this infection.  You do not respond to medications and are not  improving.  You have new sources of pain or discharge which have changed from the original infection.  You have an oral temperature above 102 F (38.9 C).  You develop abdominal pain.  You develop eye pain or signs of eye infection. Document Released: 01/20/2000 Document Revised: 04/16/2011 Document Reviewed: 02/09/2009 [facility] Patient Information 2015 [place]. This information is not intended to replace advice given to you by your health care provider. Make sure you discuss any questions you have with your health care provider.  Genital Warts Genital warts are a sexually transmitted infection. They may appear as small bumps on the tissues of the genital area. CAUSES  Genital warts are caused by a virus called human papillomavirus (HPV). HPV is the most common sexually transmitted disease (STD) and infection of the sex organs. This infection is spread by having unprotected sex with an infected person. It can be spread by vaginal, anal, and oral sex. Many people do not know they are infected. They may be infected for years without problems. However, even if they do not have problems, they can unknowingly pass the infection to their sexual partners. SYMPTOMS   Itching and irritation in the genital area.  Warts that bleed.  Painful sexual intercourse. DIAGNOSIS  Warts are usually recognized with the naked eye on the vagina, vulva, perineum, anus, and rectum. Certain tests can also diagnose genital warts, such as:  A Pap test.  A tissue sample (biopsy) exam.  Colposcopy. A magnifying tool is used to examine the vagina and cervix. The HPV cells will change color when certain solutions are  used. TREATMENT  Warts can be removed by:  Applying certain chemicals, such as cantharidin or podophyllin.  Liquid nitrogen freezing (cryotherapy).  Immunotherapy with Candida or Trichophyton injections.  Laser treatment.  Burning with an electrified probe  (electrocautery).  Interferon injections.  Surgery. PREVENTION  HPV vaccination can help prevent HPV infections that cause genital warts and that cause cancer of the cervix. It is recommended that the vaccination be given to people between the ages 71 to 58 years old. The vaccine might not work as well or might not work at all if you already have HPV. It should not be given to pregnant women. HOME CARE INSTRUCTIONS   It is important to follow your caregiver's instructions. The warts will not go away without treatment. Repeat treatments are often needed to get rid of warts. Even after it appears that the warts are gone, the normal tissue underneath often remains infected.  Do not try to treat genital warts with medicine used to treat hand warts. This type of medicine is strong and can burn the skin in the genital area, causing more damage.  Tell your past and current sexual partner(s) that you have genital warts. They may be infected also and need treatment.  Avoid sexual contact while being treated.  Do not touch or scratch the warts. The infection may spread to other parts of your body.  Women with genital warts should have a cervical cancer check (Pap test) at least once a year. This type of cancer is slow-growing and can be cured if found early. Chances of developing cervical cancer are increased with HPV.  Inform your obstetrician about your warts in the event of pregnancy. This virus can be passed to the baby's respiratory tract. Discuss this with your caregiver.  Use a condom during sexual intercourse. Following treatment, the use of condoms will help prevent reinfection.  Ask your caregiver about using over-the-counter anti-itch creams. SEEK MEDICAL CARE IF:   Your treated skin becomes red, swollen, or painful.  You have a fever.  You feel generally ill.  You feel little lumps in and around your genital area.  You are bleeding or have painful sexual intercourse. MAKE SURE  YOU:   Understand these instructions.  Will watch your condition.  Will get help right away if you are not doing well or get worse. Document Released: 01/20/2000 Document Revised: 06/08/2013 Document Reviewed: 07/31/2010 [facility] Patient Information 2015 [place], [place]. This information is not intended to replace advice given to you by your health care provider. Make sure you discuss any questions you have with your health care provider.  Monilial Vaginitis Vaginitis in a soreness, swelling and redness (inflammation) of the vagina and vulva. Monilial vaginitis is not a sexually transmitted infection. CAUSES  Yeast vaginitis is caused by yeast (candida) that is normally found in your vagina. With a yeast infection, the candida has overgrown in number to a point that upsets the chemical balance. SYMPTOMS   White, thick vaginal discharge.  Swelling, itching, redness and irritation of the vagina and possibly the lips of the vagina (vulva).  Burning or painful urination.  Painful intercourse. DIAGNOSIS  Things that may contribute to monilial vaginitis are:  Postmenopausal and virginal states.  Pregnancy.  Infections.  Being tired, sick or stressed, especially if you had monilial vaginitis in the past.  Diabetes. Good control will help lower the chance.  Birth control pills.  Tight fitting garments.  Using bubble bath, feminine sprays, douches or deodorant tampons.  Taking certain medications  that kill germs (antibiotics).  Sporadic recurrence can occur if you become ill. TREATMENT  Your caregiver will give you medication.  There are several kinds of anti monilial vaginal creams and suppositories specific for monilial vaginitis. For recurrent yeast infections, use a suppository or cream in the vagina 2 times a week, or as directed.  Anti-monilial or steroid cream for the itching or irritation of the vulva may also be used. Get your caregiver's permission.  Painting the  vagina with methylene blue solution may help if the monilial cream does not work.  Eating yogurt may help prevent monilial vaginitis. HOME CARE INSTRUCTIONS   Finish all medication as prescribed.  Do not have sex until treatment is completed or after your caregiver tells you it is okay.  Take warm sitz baths.  Do not douche.  Do not use tampons, especially scented ones.  Wear cotton underwear.  Avoid tight pants and panty hose.  Tell your sexual partner that you have a yeast infection. They should go to their caregiver if they have symptoms such as mild rash or itching.  Your sexual partner should be treated as well if your infection is difficult to eliminate.  Practice safer sex. Use condoms.  Some vaginal medications cause latex condoms to fail. Vaginal medications that harm condoms are:  Cleocin cream.  Butoconazole (Femstat).  Terconazole (Terazol) vaginal suppository.  Miconazole (Monistat) (may be purchased over the counter). SEEK MEDICAL CARE IF:   You have a temperature by mouth above 102 F (38.9 C).  The infection is getting worse after 2 days of treatment.  The infection is not getting better after 3 days of treatment.  You develop blisters in or around your vagina.  You develop vaginal bleeding, and it is not your menstrual period.  You have pain when you urinate.  You develop intestinal problems.  You have pain with sexual intercourse. Document Released: 11/01/2004 Document Revised: 04/16/2011 Document Reviewed: 07/16/2008 [facility] Patient Information 2015 [place], [place]. This information is not intended to replace advice given to you by your health care provider. Make sure you discuss any questions you have with your health care provider.  Fluconazole tablets What is this medicine? FLUCONAZOLE (floo KON na zole) is an antifungal medicine. It is used to treat certain kinds of fungal or yeast infections. This medicine may be used for other  purposes; ask your health care provider or pharmacist if you have questions. COMMON BRAND NAME(S): Diflucan What should I tell my health care provider before I take this medicine? They need to know if you have any of these conditions: -electrolyte abnormalities -history of irregular heart beat -kidney disease -an unusual or allergic reaction to fluconazole, other azole antifungals, medicines, foods, dyes, or preservatives -pregnant or trying to get pregnant -breast-feeding How should I use this medicine? Take this medicine by mouth. Follow the directions on the prescription label. Do not take your medicine more often than directed. Talk to your pediatrician regarding the use of this medicine in children. Special care may be needed. This medicine has been used in children as young as 65 months of age. Overdosage: If you think you have taken too much of this medicine contact a poison control center or emergency room at once. NOTE: This medicine is only for you. Do not share this medicine with others. What if I miss a dose? If you miss a dose, take it as soon as you can. If it is almost time for your next dose, take only that dose. Do  not take double or extra doses. What may interact with this medicine? Do not take this medicine with any of the following medications: -astemizole -certain medicines for irregular heart beat like dofetilide, dronedarone, quinidine -cisapride -erythromycin -lomitapide -other medicines that prolong the QT interval (cause an abnormal heart rhythm) -pimozide -terfenadine -thioridazine -tolvaptan -ziprasidone This medicine may also interact with the following medications: -antiviral medicines for HIV or AIDS -birth control pills -certain antibiotics like rifabutin, rifampin -certain medicines for blood pressure like amlodipine, isradipine, felodipine, hydrochlorothiazide, losartan, nifedipine -certain medicines for cancer like cyclophosphamide, vinblastine,  vincristine -certain medicines for cholesterol like atorvastatin, lovastatin, fluvastatin, simvastatin -certain medicines for depression, anxiety, or psychotic disturbances like amitriptyline, midazolam, nortriptyline, triazolam -certain medicines for diabetes like glipizide, glyburide, tolbutamide -certain medicines for pain like alfentanil, fentanyl, methadone -certain medicines for seizures like carbamazepine, phenytoin -certain medicines that treat or prevent blood clots like warfarin -halofantrine -medicines that lower your chance of fighting infection like cyclosporine, prednisone, tacrolimus -NSAIDS, medicines for pain and inflammation, like celecoxib, diclofenac, flurbiprofen, ibuprofen, meloxicam, naproxen -other medicines for fungal infections -sirolimus -theophylline -tofacitinib This list may not describe all possible interactions. Give your health care provider a list of all the medicines, herbs, non-prescription drugs, or dietary supplements you use. Also tell them if you smoke, drink alcohol, or use illegal drugs. Some items may interact with your medicine. What should I watch for while using this medicine? Visit your doctor or health care professional for regular checkups. If you are taking this medicine for a long time you may need blood work. Tell your doctor if your symptoms do not improve. Some fungal infections need many weeks or months of treatment to cure. Alcohol can increase possible damage to your liver. Avoid alcoholic drinks. If you have a vaginal infection, do not have sex until you have finished your treatment. You can wear a sanitary napkin. Do not use tampons. Wear freshly washed cotton, not synthetic, panties. What side effects may I notice from receiving this medicine? Side effects that you should report to your doctor or health care professional as soon as possible: -allergic reactions like skin rash or itching, hives, swelling of the lips, mouth, tongue, or  throat -dark urine -feeling dizzy or faint -irregular heartbeat or chest pain -redness, blistering, peeling or loosening of the skin, including inside the mouth -trouble breathing -unusual bruising or bleeding -vomiting -yellowing of the eyes or skin Side effects that usually do not require medical attention (report to your doctor or health care professional if they continue or are bothersome): -changes in how food tastes -diarrhea -headache -stomach upset or nausea This list may not describe all possible side effects. Call your doctor for medical advice about side effects. You may report side effects to FDA at 1-800-FDA-1088. Where should I keep my medicine? Keep out of the reach of children. Store at room temperature below 30 degrees C (86 degrees F). Throw away any medicine after the expiration date. NOTE: This sheet is a summary. It may not cover all possible information. If you have questions about this medicine, talk to your doctor, pharmacist, or health care provider.  2015, Elsevier/Gold Standard. (2012-08-30 16:13:04)  Valacyclovir caplets What is this medicine? VALACYCLOVIR (val ay SYE kloe veer) is an antiviral medicine. It is used to treat or prevent infections caused by certain kinds of viruses. Examples of these infections include herpes and shingles. This medicine will not cure herpes. This medicine may be used for other purposes; ask your health care provider or pharmacist  if you have questions. COMMON BRAND NAME(S): Valtrex What should I tell my health care provider before I take this medicine? They need to know if you have any of these conditions: -acquired immunodeficiency syndrome (AIDS) -any other condition that may weaken the immune system -bone marrow or kidney transplant -kidney disease -an unusual or allergic reaction to valacyclovir, acyclovir, ganciclovir, valganciclovir, other medicines, foods, dyes, or preservatives -pregnant or trying to get  pregnant -breast-feeding How should I use this medicine? Take this medicine by mouth with a glass of water. Follow the directions on the prescription label. You can take this medicine with or without food. Take your doses at regular intervals. Do not take your medicine more often than directed. Finish the full course prescribed by your doctor or health care professional even if you think your condition is better. Do not stop taking except on the advice of your doctor or health care professional. Talk to your pediatrician regarding the use of this medicine in children. While this drug may be prescribed for children as young as 2 years for selected conditions, precautions do apply. Overdosage: If you think you have taken too much of this medicine contact a poison control center or emergency room at once. NOTE: This medicine is only for you. Do not share this medicine with others. What if I miss a dose? If you miss a dose, take it as soon as you can. If it is almost time for your next dose, take only that dose. Do not take double or extra doses. What may interact with this medicine? -cimetidine -probenecid This list may not describe all possible interactions. Give your health care provider a list of all the medicines, herbs, non-prescription drugs, or dietary supplements you use. Also tell them if you smoke, drink alcohol, or use illegal drugs. Some items may interact with your medicine. What should I watch for while using this medicine? Tell your doctor or health care professional if your symptoms do not start to get better after 1 week. This medicine works best when taken early in the course of an infection, within the first 72 hours. Begin treatment as soon as possible after the first signs of infection like tingling, itching, or pain in the affected area. It is possible that genital herpes may still be spread even when you are not having symptoms. Always use safer sex practices like condoms made of  latex or polyurethane whenever you have sexual contact. You should stay well hydrated while taking this medicine. Drink plenty of fluids. What side effects may I notice from receiving this medicine? Side effects that you should report to your doctor or health care professional as soon as possible: -allergic reactions like skin rash, itching or hives, swelling of the face, lips, or tongue -aggressive behavior -confusion -hallucinations -problems with balance, talking, walking -stomach pain -tremor -trouble passing urine or change in the amount of urine Side effects that usually do not require medical attention (report to your doctor or health care professional if they continue or are bothersome): -dizziness -headache -nausea, vomiting This list may not describe all possible side effects. Call your doctor for medical advice about side effects. You may report side effects to FDA at 1-800-FDA-1088. Where should I keep my medicine? Keep out of the reach of children. Store at room temperature between 15 and 25 degrees C (59 and 77 degrees F). Keep container tightly closed. Throw away any unused medicine after the expiration date. NOTE: This sheet is a summary. It may not  cover all possible information. If you have questions about this medicine, talk to your doctor, pharmacist, or health care provider.  2015, Elsevier/Gold Standard. (2012-01-08 16:34:05)

## 2014-04-25 NOTE — ED Provider Notes (Signed)
CSN: [id number]     Arrival date & time 04/25/14  0430 History   First MD Initiated Contact with Patient 04/25/14 0540     Chief Complaint  Patient presents with  . Vaginal Itching     (Consider location/radiation/quality/duration/timing/severity/associated sxs/prior Treatment) Patient is a 29 y.o. female presenting with vaginal itching. The history is provided by the patient.  Vaginal Itching  She noticed a rash on the right side of her vulva associated with itching and pain-especially when she urinates. She rates pain at 5/10. She denies vaginal discharge. She is [redacted] weeks pregnant and pregnancy has been uncomplicated. She states that she was recently released from jail and she had complete cultures done when [place] and has not had sex since then. She has appointment with her obstetrician tomorrow. She has noticed normal fetal movement.  Past Medical History  Diagnosis Date  . Endometriosis   . Depression   . Dizzy spells   . Headache(784.0)   . PTSD (post-traumatic stress disorder)   . Anxiety   . Sickle cell trait    Past Surgical History  Procedure Laterality Date  . Dilation and curettage of uterus  2008  . Vaginal delivery      X 1  . Tooth extraction     Family History  Problem Relation Age of Onset  . Anesthesia problems Neg Hx   . Diabetes Maternal Grandmother   . Diabetes Maternal Grandfather   . Diabetes Paternal Grandmother   . Diabetes Paternal Grandfather   . Other Cousin     sickle cell disease  . Arthritis Other   . Asthma Other   . Early death Other     uncle-suicide  . Cataracts Other   . Congestive Heart Failure Other   . Hyperlipidemia Other   . Hypertension Other   . Stroke Other   . Migraines Other   . Diabetes Father   . Hypertension Father    History  Substance Use Topics  . Smoking status: Never Smoker   . Smokeless tobacco: Never Used  . Alcohol Use: Yes     Comment: occas. social  LAST  DRANK IN SEPT-2014   OB History     Gravida Para Term Preterm AB TAB SAB Ectopic Multiple Living   0 1 0 1 0 0 2     Review of Systems  All other systems reviewed and are negative.     Allergies  Latex  Home Medications   Prior to Admission medications   Medication Sig Start Date End Date Taking? Authorizing Provider  aspirin EC 81 MG tablet Take 1 tablet (81 mg total) by mouth daily. Take after 12 weeks for prevention of preeclampssia later in pregnancy 03/15/14  Yes Ugonna A Anyanwu, MD  labetalol (NORMODYNE) 200 MG tablet Take 1 tablet (200 mg total) by mouth 2 (two) times daily. 02/18/14  Yes Marlis Edelson, CNM  METOPROLOL TARTRATE PO Take 1 tablet by mouth 2 (two) times daily.   Yes Historical Provider, MD  prenatal vitamin w/FE, FA (PRENATAL 1 + 1) 27-1 MG TABS tablet Take 1 tablet by mouth daily at 12 noon.   Yes Historical Provider, MD  sertraline (ZOLOFT) 100 MG tablet Take 100 mg by mouth daily.   Yes Historical Provider, MD   BP 148/63 mmHg  Pulse 109  Temp(Src) 99 F (37.2 C) (Oral)  Resp 18  Ht  (1.702 m)  Wt 212 lb (96.163 kg)  BMI 33.20  kg/m2  SpO2 98%  LMP  (LMP Unknown) Physical Exam  Nursing note and vitals reviewed.  29 year old female, resting comfortably and in no acute distress. Vital signs are significant for hypertension. Tachycardia is present but within the physiologic range expected in pregnancy. Oxygen saturation is 98%, which is normal. Head is normocephalic and atraumatic. PERRLA, EOMI. Oropharynx is clear. Neck is nontender and supple without adenopathy or JVD. Back is nontender and there is no CVA tenderness. Lungs are clear without rales, wheezes, or rhonchi. Chest is nontender. Heart has regular rate and rhythm without murmur. Abdomen is soft, flat, nontender without masses or hepatosplenomegaly and peristalsis is normoactive. Gravid uterus present with fundal height consistent with dates. Pelvic: Right labia majora has vesicular rash on the mucosal side. This  is very tender to palpation. There are also several lesions of condyloma acuminata in the posterior aspect of the ball the. Cervix is closed and there is no significant vaginal discharge present. Bimanual exam was not done. Extremities have no cyanosis or edema, full range of motion is present. Skin is warm and dry without rash. Neurologic: Mental status is normal, cranial nerves are intact, there are no motor or sensory deficits.  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 Condylomata acuminata.  Wet prep does show a few yeast. Herpes culture has been sent. She is discharged with prescription for single dose of fluconazole, and also prescription for valacyclovir. She is to keep her appointment with her obstetrician tomorrow.  Dione Boozeavid Nelli Swalley, MD 04/25/14 [phone]

## 2014-04-25 NOTE — ED Notes (Signed)
Pt escorted to discharge window. Pt verbalized understanding discharge instructions. In no acute distress.  

## 2014-04-25 NOTE — ED Notes (Signed)
Pt c/o vaginal itching associated with rash. +dysuria. Denies fever, denies n/v/d, denies vaginal d/c or bleeding, denies back pain. Pt is 22W preg

## 2014-04-26 ENCOUNTER — Other Ambulatory Visit: Payer: Medicaid Other

## 2014-04-28 LAB — HERPES SIMPLEX VIRUS CULTURE: Culture: NOT DETECTED

## 2014-04-29 ENCOUNTER — Encounter: Payer: Self-pay | Admitting: Obstetrics and Gynecology

## 2014-05-11 ENCOUNTER — Encounter: Payer: Self-pay | Admitting: General Practice

## 2014-06-07 ENCOUNTER — Ambulatory Visit (INDEPENDENT_AMBULATORY_CARE_PROVIDER_SITE_OTHER): Payer: Medicaid Other | Admitting: Obstetrics & Gynecology

## 2014-06-07 ENCOUNTER — Telehealth: Payer: Medicaid Other | Admitting: Family

## 2014-06-07 VITALS — BP 130/78 | HR 115 | Wt 227.8 lb

## 2014-06-07 DIAGNOSIS — O0992 Supervision of high risk pregnancy, unspecified, second trimester: Secondary | ICD-10-CM

## 2014-06-07 DIAGNOSIS — M549 Dorsalgia, unspecified: Secondary | ICD-10-CM

## 2014-06-07 LAB — CBC
HEMATOCRIT: 32.2 % — AB (ref 36.0–46.0)
Hemoglobin: 10.3 g/dL — ABNORMAL LOW (ref 12.0–15.0)
MCH: 21.7 pg — ABNORMAL LOW (ref 26.0–34.0)
MCHC: 32 g/dL (ref 30.0–36.0)
MCV: 67.9 fL — ABNORMAL LOW (ref 78.0–100.0)
PLATELETS: 288 10*3/uL (ref 150–400)
RBC: 4.74 MIL/uL (ref 3.87–5.11)
RDW: 17 % — ABNORMAL HIGH (ref 11.5–15.5)
WBC: 6 10*3/uL (ref 4.0–10.5)

## 2014-06-07 LAB — POCT URINALYSIS DIP (DEVICE)
BILIRUBIN URINE: NEGATIVE
Glucose, UA: NEGATIVE mg/dL
Hgb urine dipstick: NEGATIVE
Ketones, ur: NEGATIVE mg/dL
Leukocytes, UA: NEGATIVE
Nitrite: NEGATIVE
PROTEIN: 30 mg/dL — AB
Specific Gravity, Urine: >=1.03 (ref 1.005–1.030)
Urobilinogen, UA: 0.2 mg/dL (ref 0.0–1.0)
pH: 7 (ref 5.0–8.0)

## 2014-06-07 MED ORDER — CONCEPT DHA 53.5-38-1 MG PO CAPS: 1.0000 | ORAL_CAPSULE | Freq: Every day | ORAL | Status: DC

## 2014-06-07 NOTE — Progress Notes (Signed)
Based on what you shared with me it looks like you have a serious condition that should be evaluated in a face to face office visit.  You need to go get checked out since you are pregnant.  If you are having a true medical emergency please call 911.  If you need an urgent face to face visit, [facility] has four urgent care centers for your convenience.  Tressie Ellis. [facility]  [phone] Get Driving Directions Find a Provider at this Location  [address] [place], [place] [address] . 8 am to 8 pm Monday-Friday . 9 am to 7 pm Saturday-Sunday  . [facility]  [phone] Get Driving Directions Find a Provider at this Location  [address], [address] [place], [place] [address] . 8 am to 8 pm Monday-Friday . 9 am to 6 pm Saturday . 11 am to 6 pm Sunday   . [facility] [place]  [phone] Get Driving Directions  [address] [place], [place] [address] . 8 am to 8 pm Monday-Friday . 9 am to 4 pm Saturday-Sunday   . [facility] (a walk in primary care provider)  [phone]  Get Driving Directions Find a Provider at this Location  [address] [place], [place] [address] . 8 am to 8:30 pm Monday-Thursday . 8 am to 6 pm Friday . 8 am to 4 pm Saturday-Sunday   Your e-visit answers were reviewed by a board certified advanced clinical practitioner to complete your personal care plan.  Depending on the condition, your plan could have included both over the counter or prescription medications.  You will get an e-mail in the next two days asking about your experience.  I hope that your e-visit has been valuable and will speed your recovery . Thank you for choosing an e-visit.

## 2014-06-07 NOTE — Progress Notes (Signed)
Patient has not taken any BP meds since March 3rd.  Will monitor carefully.  BP normal.  Fall was greater than 24 hours ago.  No bleeding or contractions.  28 week labs and Tdap today. US for gorwth given history of CHTN.  Pat wants titers for HSV.  Pt thought she had contact dermatitis.  Both HSV cultures were negative.

## 2014-06-07 NOTE — Progress Notes (Signed)
States fell on porch yesterday-landed on left side - states stomach did not get hit. States has felt baby move well since then, denies contractions. Denies any vaginal bleeding.

## 2014-06-07 NOTE — Progress Notes (Signed)
Filled out and given to patient Medical Instructions Form [facility]

## 2014-06-07 NOTE — Progress Notes (Signed)
U/S with Radiology 06/09/14 @ 215p.

## 2014-06-08 LAB — RPR

## 2014-06-08 LAB — HIV ANTIBODY (ROUTINE TESTING W REFLEX): HIV 1&2 Ab, 4th Generation: NONREACTIVE

## 2014-06-08 LAB — GLUCOSE TOLERANCE, 1 HOUR (50G) W/O FASTING: Glucose, 1 Hour GTT: 77 mg/dL (ref 70–140)

## 2014-06-09 ENCOUNTER — Other Ambulatory Visit: Payer: Self-pay | Admitting: Obstetrics & Gynecology

## 2014-06-09 ENCOUNTER — Ambulatory Visit (HOSPITAL_COMMUNITY)
Admission: RE | Admit: 2014-06-09 | Discharge: 2014-06-09 | Disposition: A | Discharge status: Home / Self Care | Payer: Medicaid Other | Source: Ambulatory Visit | Attending: Obstetrics & Gynecology | Admitting: Obstetrics & Gynecology

## 2014-06-09 DIAGNOSIS — O0992 Supervision of high risk pregnancy, unspecified, second trimester: Secondary | ICD-10-CM

## 2014-06-09 DIAGNOSIS — O352XX0 Maternal care for (suspected) hereditary disease in fetus, not applicable or unspecified: Secondary | ICD-10-CM | POA: Diagnosis not present

## 2014-06-09 DIAGNOSIS — Z3A27 27 weeks gestation of pregnancy: Secondary | ICD-10-CM | POA: Diagnosis not present

## 2014-06-09 DIAGNOSIS — O10012 Pre-existing essential hypertension complicating pregnancy, second trimester: Secondary | ICD-10-CM | POA: Diagnosis present

## 2014-06-09 LAB — HSV(HERPES SMPLX)ABS-I+II(IGG+IGM)-BLD
HSV 1 GLYCOPROTEIN G AB, IGG: 2.03 IV — AB
HSV 2 Glycoprotein G Ab, IgG: <0.1 IV
Herpes Simplex Vrs I&II-IgM Ab (EIA): 1.08 INDEX

## 2014-06-11 ENCOUNTER — Encounter: Payer: Self-pay | Admitting: Obstetrics & Gynecology

## 2014-06-11 DIAGNOSIS — B009 Herpesviral infection, unspecified: Secondary | ICD-10-CM | POA: Insufficient documentation

## 2014-06-14 ENCOUNTER — Telehealth: Payer: Self-pay | Admitting: *Deleted

## 2014-06-14 NOTE — Telephone Encounter (Signed)
-----   Message from Lesly DukesKelly H Leggett, MD sent at 06/11/2014  4:22 PM EDT ----- Pt is positive for HSV 1.  This does not tell us if she has genital herpes but that she has been exposed to herpes in the past (like a cold sore).  Please call patient and discuss results.  If you are confused by this message, let me know and I will call.

## 2014-06-14 NOTE — Telephone Encounter (Signed)
Called patient and someone picked up and hung up phone. Tried again and phone just rings.

## 2014-06-15 NOTE — Telephone Encounter (Signed)
Called patient, no answer- left message stating we are trying to reach you with results, please call us back at the clinics 

## 2014-06-17 ENCOUNTER — Encounter: Payer: Self-pay | Admitting: General Practice

## 2014-06-17 NOTE — Telephone Encounter (Signed)
Called patient, no answer- left message stating we are trying to reach you with results, please call us back at the clinics. Will send letter 

## 2014-06-22 ENCOUNTER — Encounter (HOSPITAL_COMMUNITY): Payer: Self-pay | Admitting: *Deleted

## 2014-06-22 ENCOUNTER — Inpatient Hospital Stay (HOSPITAL_COMMUNITY)
Admission: AD | Admit: 2014-06-22 | Discharge: 2014-06-22 | Disposition: A | Discharge status: Home / Self Care | Payer: Medicaid Other | Source: Ambulatory Visit | Attending: Family Medicine | Admitting: Family Medicine

## 2014-06-22 DIAGNOSIS — W1830XA Fall on same level, unspecified, initial encounter: Secondary | ICD-10-CM

## 2014-06-22 DIAGNOSIS — Y92009 Unspecified place in unspecified non-institutional (private) residence as the place of occurrence of the external cause: Secondary | ICD-10-CM

## 2014-06-22 DIAGNOSIS — M25552 Pain in left hip: Secondary | ICD-10-CM | POA: Diagnosis present

## 2014-06-22 DIAGNOSIS — Z3A29 29 weeks gestation of pregnancy: Secondary | ICD-10-CM | POA: Diagnosis not present

## 2014-06-22 DIAGNOSIS — O0992 Supervision of high risk pregnancy, unspecified, second trimester: Secondary | ICD-10-CM

## 2014-06-22 DIAGNOSIS — O9989 Other specified diseases and conditions complicating pregnancy, childbirth and the puerperium: Secondary | ICD-10-CM | POA: Insufficient documentation

## 2014-06-22 DIAGNOSIS — O10912 Unspecified pre-existing hypertension complicating pregnancy, second trimester: Secondary | ICD-10-CM

## 2014-06-22 DIAGNOSIS — Y999 Unspecified external cause status: Secondary | ICD-10-CM

## 2014-06-22 DIAGNOSIS — W19XXXS Unspecified fall, sequela: Secondary | ICD-10-CM

## 2014-06-22 DIAGNOSIS — Y939 Activity, unspecified: Secondary | ICD-10-CM

## 2014-06-22 DIAGNOSIS — Y929 Unspecified place or not applicable: Secondary | ICD-10-CM

## 2014-06-22 MED ORDER — ACETAMINOPHEN 500 MG PO TABS: 1000.0000 mg | ORAL_TABLET | Freq: Three times a day (TID) | ORAL | Status: DC | PRN

## 2014-06-22 MED ORDER — CYCLOBENZAPRINE HCL 5 MG PO TABS: 5.0000 mg | ORAL_TABLET | Freq: Three times a day (TID) | ORAL | Status: DC | PRN

## 2014-06-22 NOTE — MAU Note (Signed)
PT SAYS ON 5-1 AT 10AM-  - SHE TRIPPED  ON HANDICAP  RAMP  AT HOME-  FELL   ON LEFT  SIDE.     DID NOT HIT ABD   OR HEAD.    ON 5-3-  HAD  SCH APPOINTMENT -  TOLD  THEM   - TOLD  SHE  FINE.  Marland Kitchen.   BUT PAIN HAS GRADUALLY BECAME  WORSE -   DID NOT CALL OFFICE   BUT  DID E- VISIT   THIS   AM  -  AND THEY  TOLD  TO COME HERE.

## 2014-06-22 NOTE — MAU Provider Note (Signed)
History    CSN: [id number]  [id number] date and time: 06/22/14 1029  CC: L. Hip pain s/p fall  HPI   Patient is 29 y.o. [id number] 5380w2d here with complaints of left hip pain. Worsening pain over the last three weeks since fall. Can't lay on left side. Pillow to elevate left side helps. First time presenting for pain. Pt landed on bottom to try and prevent fall on abdomen. States she fell on wooden deck.   +FM, denies LOF, VB, contractions, vaginal discharge.   Past Medical History  Diagnosis Date  . Endometriosis   . Depression   . Dizzy spells   . Headache(784.0)   . PTSD (post-traumatic stress disorder)   . Anxiety   . Sickle cell trait     Past Surgical History  Procedure Laterality Date  . Dilation and curettage of uterus  2008  . Vaginal delivery      X 1  . Tooth extraction      Family History  Problem Relation Age of Onset  . Anesthesia problems Neg Hx   . Diabetes Maternal Grandmother   . Diabetes Maternal Grandfather   . Diabetes Paternal Grandmother   . Diabetes Paternal Grandfather   . Other Cousin     sickle cell disease  . Arthritis Other   . Asthma Other   . Early death Other     uncle-suicide  . Cataracts Other   . Congestive Heart Failure Other   . Hyperlipidemia Other   . Hypertension Other   . Stroke Other   . Migraines Other   . Diabetes Father   . Hypertension Father     History  Substance Use Topics  . Smoking status: Never Smoker   . Smokeless tobacco: Never Used  . Alcohol Use: Yes     Comment: occas. social  LAST  DRANK IN SEPT-2014    Allergies:  Allergies  Allergen Reactions  . Latex Rash    Prescriptions prior to admission  Medication Sig Dispense Refill Last Dose  . aspirin EC 81 MG tablet Take 1 tablet (81 mg total) by mouth daily. Take after 12 weeks for prevention of preeclampssia later in pregnancy (Patient not taking: Reported on 06/07/2014) 300 tablet 2 Not Taking  . labetalol (NORMODYNE) 200 MG tablet Take 1 tablet  (200 mg total) by mouth 2 (two) times daily. (Patient not taking: Reported on 06/07/2014) 60 tablet 3 Not Taking  . METOPROLOL TARTRATE PO Take 1 tablet by mouth 2 (two) times daily.   Not Taking  . Prenat-FeFum-FePo-FA-Omega 3 (CONCEPT DHA) 53.5-38-1 MG CAPS Take 1 tablet by mouth daily. 30 capsule 5   . prenatal vitamin w/FE, FA (PRENATAL 1 + 1) 27-1 MG TABS tablet Take 1 tablet by mouth daily at 12 noon.   Not Taking  . sertraline (ZOLOFT) 100 MG tablet Take 100 mg by mouth daily.   Taking  . valACYclovir (VALTREX) 1000 MG tablet Take 1 tablet (1,000 mg total) by mouth 2 (two) times daily. (Patient not taking: Reported on 06/07/2014) 14 tablet 0 Not Taking    Review of Systems  Constitutional: Negative for fever and chills.  Eyes: Negative for blurred vision and double vision.  Respiratory: Negative for shortness of breath.   Cardiovascular: Negative for chest pain and leg swelling.  Gastrointestinal: Negative for nausea, vomiting and constipation.  Genitourinary: Negative for dysuria.  Musculoskeletal: Positive for falls.  Neurological: Positive for focal weakness. Negative for dizziness and headaches.   Physical Exam  Blood pressure 119/61, pulse 133, temperature 98.8 F (37.1 C), temperature source Oral, resp. rate 20, height 5\' 7"  (1.702 m), weight 233 lb 2 oz (105.745 kg), not currently breastfeeding.  Physical Exam  Constitutional: She is oriented to person, place, and time. She appears well-developed.  Cardiovascular: Normal rate and normal heart sounds.   Respiratory: Effort normal and breath sounds normal.  Musculoskeletal: Normal range of motion.  Sacrococcygeal tenderness on R  Neurological: She is alert and oriented to person, place, and time. No cranial nerve deficit.    MAU Course  Procedures  MDM: NST reviews and reassuring.  No imaging needed at this time  Assessment and Plan   Sacrococcygeal tenderness: s/p fall on left buttocks. No red flags on exam.  -Rx  for flexeril and 1000 mg Tylenol -No need for imaging at this time due to benign physical exam -Conservative management  -Patient given instructions to follow-up with doctor or return if not improved in a couple weeks or worsening.   [name], DO 06/22/2014, 8:29 PM PGY-1, [facility]   OB fellow attestation:  I have seen and examined this patient; I agree with above documentation in the resident's note.   Danielle [name] is a 29 y.o. [phone] reporting left hip pain after fall (3 weeks ago) +FM, denies LOF, VB, contractions, vaginal discharge.  PE: BP 115/82 mmHg  Pulse 101  Temp(Src) 98.8 F (37.1 C) (Oral)  Resp 101  Ht 5\' 7"  (1.702 m)  Wt 233 lb 2 oz (105.745 kg)  BMI 36.50 kg/m2  LMP  (LMP Unknown) Gen: calm comfortable, NAD Resp: normal effort, no distress Abd: gravid  ROS, labs, PMH reviewed NST reactive   Plan: - fetal kick counts reinforced, no signs of preterm labor at this time, NO contractions - advised tylenol prn pain, rx flexeril - continue routine follow up in OB clinic  Danielle [name],Danielle [name] ROCIO, MD 7:39 PM

## 2014-06-22 NOTE — Discharge Instructions (Signed)
[facility] for [facility]. Medications prescribed for pain.

## 2014-06-28 ENCOUNTER — Ambulatory Visit (INDEPENDENT_AMBULATORY_CARE_PROVIDER_SITE_OTHER): Payer: Self-pay | Admitting: Family Medicine

## 2014-06-28 VITALS — BP 110/53 | HR 118 | Wt 233.0 lb

## 2014-06-28 DIAGNOSIS — O99212 Obesity complicating pregnancy, second trimester: Secondary | ICD-10-CM

## 2014-06-28 DIAGNOSIS — O99213 Obesity complicating pregnancy, third trimester: Secondary | ICD-10-CM

## 2014-06-28 DIAGNOSIS — O10913 Unspecified pre-existing hypertension complicating pregnancy, third trimester: Secondary | ICD-10-CM

## 2014-06-28 DIAGNOSIS — E669 Obesity, unspecified: Secondary | ICD-10-CM

## 2014-06-28 DIAGNOSIS — O0993 Supervision of high risk pregnancy, unspecified, third trimester: Secondary | ICD-10-CM

## 2014-06-28 DIAGNOSIS — O0992 Supervision of high risk pregnancy, unspecified, second trimester: Secondary | ICD-10-CM

## 2014-06-28 LAB — POCT URINALYSIS DIP (DEVICE)
Bilirubin Urine: NEGATIVE
GLUCOSE, UA: NEGATIVE mg/dL
Hgb urine dipstick: NEGATIVE
Ketones, ur: NEGATIVE mg/dL
Nitrite: NEGATIVE
PH: 6.5 (ref 5.0–8.0)
Protein, ur: 100 mg/dL — AB
Specific Gravity, Urine: >=1.03 (ref 1.005–1.030)
Urobilinogen, UA: 0.2 mg/dL (ref 0.0–1.0)

## 2014-06-28 MED ORDER — ACETAMINOPHEN 500 MG PO TABS: 1000.0000 mg | ORAL_TABLET | Freq: Three times a day (TID) | ORAL | Status: DC | PRN

## 2014-06-28 NOTE — Progress Notes (Signed)
Needs rx for Tylenol as she lives in maternity home and can't buy otc.

## 2014-06-28 NOTE — Progress Notes (Signed)
No concerns.  Blood pressure controlled.  No symptoms of preeclampsia.   Last US 5/4 - 77%tile.  Cephalic. Repeat US in 3 weeks for growth. NSTs twice weekly.

## 2014-06-28 NOTE — Progress Notes (Signed)
U/S 07/22/14 @ 1245p with Radiology.

## 2014-07-01 ENCOUNTER — Other Ambulatory Visit: Payer: Self-pay

## 2014-07-02 ENCOUNTER — Other Ambulatory Visit: Payer: Self-pay | Admitting: Obstetrics and Gynecology

## 2014-07-02 ENCOUNTER — Other Ambulatory Visit: Payer: Self-pay | Admitting: Family Medicine

## 2014-07-06 MED ORDER — ACETAMINOPHEN 500 MG PO TABS: 1000.0000 mg | ORAL_TABLET | Freq: Three times a day (TID) | ORAL | Status: DC | PRN

## 2014-07-12 ENCOUNTER — Ambulatory Visit (INDEPENDENT_AMBULATORY_CARE_PROVIDER_SITE_OTHER): Payer: Medicaid Other | Admitting: Family Medicine

## 2014-07-12 ENCOUNTER — Inpatient Hospital Stay (HOSPITAL_COMMUNITY)
Admission: AD | Admit: 2014-07-12 | Discharge: 2014-07-12 | Disposition: A | Discharge status: Home / Self Care | Payer: Medicaid Other | Source: Ambulatory Visit | Attending: Obstetrics & Gynecology | Admitting: Obstetrics & Gynecology

## 2014-07-12 ENCOUNTER — Encounter (HOSPITAL_COMMUNITY): Payer: Self-pay | Admitting: *Deleted

## 2014-07-12 VITALS — BP 149/75 | HR 114 | Wt 231.5 lb

## 2014-07-12 DIAGNOSIS — Z3A32 32 weeks gestation of pregnancy: Secondary | ICD-10-CM | POA: Diagnosis not present

## 2014-07-12 DIAGNOSIS — O10913 Unspecified pre-existing hypertension complicating pregnancy, third trimester: Secondary | ICD-10-CM | POA: Diagnosis present

## 2014-07-12 DIAGNOSIS — O99213 Obesity complicating pregnancy, third trimester: Secondary | ICD-10-CM | POA: Diagnosis not present

## 2014-07-12 DIAGNOSIS — O0992 Supervision of high risk pregnancy, unspecified, second trimester: Secondary | ICD-10-CM

## 2014-07-12 DIAGNOSIS — O0993 Supervision of high risk pregnancy, unspecified, third trimester: Secondary | ICD-10-CM | POA: Diagnosis not present

## 2014-07-12 DIAGNOSIS — O113 Pre-existing hypertension with pre-eclampsia, third trimester: Secondary | ICD-10-CM | POA: Insufficient documentation

## 2014-07-12 DIAGNOSIS — R030 Elevated blood-pressure reading, without diagnosis of hypertension: Secondary | ICD-10-CM | POA: Diagnosis present

## 2014-07-12 DIAGNOSIS — E669 Obesity, unspecified: Secondary | ICD-10-CM | POA: Diagnosis not present

## 2014-07-12 HISTORY — DX: Essential (primary) hypertension: I10

## 2014-07-12 LAB — CBC
HCT: 30 % — ABNORMAL LOW (ref 36.0–46.0)
HEMOGLOBIN: 9.6 g/dL — AB (ref 12.0–15.0)
MCH: 21 pg — ABNORMAL LOW (ref 26.0–34.0)
MCHC: 32 g/dL (ref 30.0–36.0)
MCV: 65.5 fL — ABNORMAL LOW (ref 78.0–100.0)
PLATELETS: 208 10*3/uL (ref 150–400)
RBC: 4.58 MIL/uL (ref 3.87–5.11)
RDW: 16.2 % — ABNORMAL HIGH (ref 11.5–15.5)
WBC: 6.5 10*3/uL (ref 4.0–10.5)

## 2014-07-12 LAB — COMPREHENSIVE METABOLIC PANEL
ALT: 8 U/L — ABNORMAL LOW (ref 14–54)
AST: 16 U/L (ref 15–41)
Albumin: 2.8 g/dL — ABNORMAL LOW (ref 3.5–5.0)
Alkaline Phosphatase: 71 U/L (ref 38–126)
Anion gap: 5 (ref 5–15)
BILIRUBIN TOTAL: 0.1 mg/dL — AB (ref 0.3–1.2)
BUN: 5 mg/dL — AB (ref 6–20)
CO2: 19 mmol/L — AB (ref 22–32)
CREATININE: 0.35 mg/dL — AB (ref 0.44–1.00)
Calcium: 8.6 mg/dL — ABNORMAL LOW (ref 8.9–10.3)
Chloride: 110 mmol/L (ref 101–111)
GFR calc Af Amer: >60 mL/min (ref >60)
GFR calc non Af Amer: >60 mL/min (ref >60)
Glucose, Bld: 105 mg/dL — ABNORMAL HIGH (ref 65–99)
Potassium: 4 mmol/L (ref 3.5–5.1)
Sodium: 134 mmol/L — ABNORMAL LOW (ref 135–145)
Total Protein: 6.5 g/dL (ref 6.5–8.1)

## 2014-07-12 LAB — POCT URINALYSIS DIP (DEVICE)
Bilirubin Urine: NEGATIVE
Glucose, UA: NEGATIVE mg/dL
Hgb urine dipstick: NEGATIVE
Ketones, ur: NEGATIVE mg/dL
Nitrite: NEGATIVE
PH: 6 (ref 5.0–8.0)
Protein, ur: 100 mg/dL — AB
Urobilinogen, UA: 1 mg/dL (ref 0.0–1.0)

## 2014-07-12 LAB — PROTEIN / CREATININE RATIO, URINE
Creatinine, Urine: 277 mg/dL
PROTEIN CREATININE RATIO: 0.32 mg/mg{creat} — AB (ref 0.00–0.15)
Total Protein, Urine: 88 mg/dL

## 2014-07-12 MED ORDER — BETAMETHASONE SOD PHOS & ACET 6 (3-3) MG/ML IJ SUSP
12.0000 mg | Freq: Once | INTRAMUSCULAR | Status: AC
  Administered 2014-07-12: 12 mg via INTRAMUSCULAR
  Filled 2014-07-12: qty 2

## 2014-07-12 NOTE — MAU Note (Signed)
Hx of CHTN.  At clinic for NST, was reactive. C/o nausea.  BP elevated.  Denies HA, visual changes, epigastric pain or swelling

## 2014-07-12 NOTE — MAU Provider Note (Signed)
History     CSN: [id number]  Arrival date and time: 07/12/14 1226   None     Chief Complaint  Patient presents with  . Hypertension   HPI  Danielle [name] is a 29 y.o. [id number] at 6647w1d sent from clinic to the MAU for evaluation of elevated blood pressure. He has a history of chronic hypertension and blood pressure was elevated in clinic today (mild range 140s-150s/60s-70s) without associated symptoms. Urine dip with 1+ protein. Patient reports feeling nauseated and flushed earlier today but those symptoms have now resolves. Denies headache, scotoma, chest pain, shortness of breath, RUQ pain, edema or other concerning symptoms. States she was previously on blood pressure medication but this was discontinued in the second trimester secondary to low blood pressures.   +Fetal movement. Denies contractions, loss of fluid, vaginal bleeding.    Past Medical History  Diagnosis Date  . Endometriosis   . Depression   . Dizzy spells   . Headache(784.0)   . PTSD (post-traumatic stress disorder)   . Anxiety   . Sickle cell trait   . Hypertension     Past Surgical History  Procedure Laterality Date  . Dilation and curettage of uterus  2008  . Vaginal delivery      X 1  . Tooth extraction      Family History  Problem Relation Age of Onset  . Anesthesia problems Neg Hx   . Diabetes Maternal Grandmother   . Diabetes Maternal Grandfather   . Diabetes Paternal Grandmother   . Diabetes Paternal Grandfather   . Other Cousin     sickle cell disease  . Arthritis Other   . Asthma Other   . Early death Other     uncle-suicide  . Cataracts Other   . Congestive Heart Failure Other   . Hyperlipidemia Other   . Hypertension Other   . Stroke Other   . Migraines Other   . Diabetes Father   . Hypertension Father     History  Substance Use Topics  . Smoking status: Never Smoker   . Smokeless tobacco: Never Used  . Alcohol Use: Yes     Comment: occas. social  LAST  DRANK IN SEPT-2014     Allergies:  Allergies  Allergen Reactions  . Latex Rash    Prescriptions prior to admission  Medication Sig Dispense Refill Last Dose  . acetaminophen (TYLENOL) 500 MG tablet Take 2 tablets (1,000 mg total) by mouth every 8 (eight) hours as needed for mild pain or moderate pain. 10 tablet 0   . cyclobenzaprine (FLEXERIL) 5 MG tablet Take 1 tablet (5 mg total) by mouth 3 (three) times daily as needed for muscle spasms. 20 tablet 0 Taking  . Prenat-FeFum-FePo-FA-Omega 3 (CONCEPT DHA) 53.5-38-1 MG CAPS Take 1 tablet by mouth daily. 30 capsule 5 Taking  . sertraline (ZOLOFT) 100 MG tablet Take 100 mg by mouth daily.   Taking  . valACYclovir (VALTREX) 1000 MG tablet Take 1 tablet (1,000 mg total) by mouth 2 (two) times daily. (Patient not taking: Reported on 06/07/2014) 14 tablet 0 Not Taking    Review of Systems  Constitutional: Negative.  Negative for fever, chills and malaise/fatigue.  HENT: Negative.  Negative for congestion and sore throat.   Eyes: Negative.  Negative for double vision and photophobia.  Respiratory: Negative.  Negative for cough, shortness of breath and wheezing.   Cardiovascular: Negative.  Negative for chest pain and leg swelling.  Gastrointestinal: Positive for nausea (earlier today, now  resolved). Negative for vomiting, abdominal pain, diarrhea and constipation.  Genitourinary: Negative.  Negative for dysuria, urgency, frequency and hematuria.  Musculoskeletal: Negative.  Negative for myalgias.  Skin: Negative.   Neurological: Negative.  Negative for weakness and headaches.  Psychiatric/Behavioral: Negative.   All other systems reviewed and are negative.  Physical Exam   Blood pressure 120/96, pulse 113, temperature 98 F (36.7 C), temperature source Oral, resp. rate 18, not currently breastfeeding.  Physical Exam  Nursing note and vitals reviewed. Constitutional: She is oriented to person, place, and time. She appears well-developed and well-nourished. No  distress.  HENT:  Head: Normocephalic and atraumatic.  Cardiovascular: Normal rate.   Respiratory: Effort normal.  GI:  Gravid  Neurological: She is alert and oriented to person, place, and time.  Skin: Skin is warm and dry.  Psychiatric: She has a normal mood and affect. Her behavior is normal.    MAU Course  Procedures  MDM NST reactive Toco quiet Monitor blood pressures CBC, CMP, UPC   Assessment and Plan  Danielle [name] is a 29 y.o. Z6X0960 at [redacted]w[redacted]d with a history of chronic hypertension here for elevated blood pressures and rule out pre-eclampsia. Blood pressure remained normal range. HELLP labs negative. UPC 0.32. Consistent with a new diagnosis of preeclampsia without severe features.   # Preeclampsia without severe features - Blood pressure normal to mild range.  - HELLP neg - Will follow up in clinic this week. Will need twice weekly antenatal testing starting this week.   Patient was discharged by CNM in MAU while writer was in a delivery. See separate note from same day with complete plan.    William Dalton 07/12/2014, 1:10 PM

## 2014-07-12 NOTE — Discharge Instructions (Signed)

## 2014-07-12 NOTE — MAU Provider Note (Signed)
S: denies any headache, dizziness, blurred vision or epigastric pain.  O: VSS, Labs stable. A: preg at 32.1 wks early pre eclampsia P: POC discussed with Erin FullingHarraway- Smith pt to receive BMX x 2, antenatal testing and to f/u Thurs in clinic.

## 2014-07-12 NOTE — Progress Notes (Signed)
Subjective:   Dorna Maiori Call is a 29 y.o. Z6X0960G4P2012 at 598w1d being seen today for her obstetrical visit.  Patient reports nausea.   Contractions: Contractions: Not present.   Vaginal Bleeding Vag. Bleeding: None.   Fetal Movement: Movement: Present.   Denies contractions, vaginal bleeding or leaking of fluid.  Reports good fetal movement.  BP has been controlled thus far.  Denies headache, scotoma.    The following portions of the patient's history were reviewed and updated as appropriate: allergies, current medications, past family history, past medical history, past social history, past surgical history and problem list.   Objective:  BP 149/75 mmHg  Pulse 114  Wt 231 lb 8 oz (105.008 kg)  LMP  (LMP Unknown) Fetal Heart Rate: Fetal Heart Rate (bpm): NST  Fetal Movement: Movement: Present   Abdomen: Soft, gravid, appropriate for gestational age.  Pain/Pressure: Pain/Pressure: Absent     Extremities: Edema:     Urinalysis: Protein:   Glucose:   Results for orders placed or performed in visit on 07/12/14 (from the past 24 hour(s))  POCT urinalysis dip (device)     Status: Abnormal   Collection Time: 07/12/14 11:30 AM  Result Value Ref Range   Glucose, UA NEGATIVE NEGATIVE mg/dL   Bilirubin Urine NEGATIVE NEGATIVE   Ketones, ur NEGATIVE NEGATIVE mg/dL   Specific Gravity, Urine >=1.030 1.005 - 1.030   Hgb urine dipstick NEGATIVE NEGATIVE   pH 6.0 5.0 - 8.0   Protein, ur 100 (A) NEGATIVE mg/dL   Urobilinogen, UA 1.0 0.0 - 1.0 mg/dL   Nitrite NEGATIVE NEGATIVE   Leukocytes, UA SMALL (A) NEGATIVE     Assessment and Plan:   Pregnancy:  A5W0981G4P2012 at 358w1d  1.  Supervision high risk pregnancy NST reactive.  Fundal height appropriate  2. Chronic hypertension in pregnancy, third trimester 2+ protein, elevated BP today.  Will send to MAU for CMP, protein/creatinine ratio, CBC.   Follow up in Thursday for NST.   Levie HeritageJacob J Stinson, DO

## 2014-07-12 NOTE — Progress Notes (Signed)
Pt reports feeling nauseous and hot during NST- position changes did not change the feeling.

## 2014-07-13 ENCOUNTER — Inpatient Hospital Stay (HOSPITAL_COMMUNITY)
Admission: AD | Admit: 2014-07-13 | Discharge: 2014-07-13 | Disposition: A | Discharge status: Home / Self Care | Payer: Medicaid Other | Source: Ambulatory Visit | Attending: Obstetrics & Gynecology | Admitting: Obstetrics & Gynecology

## 2014-07-13 DIAGNOSIS — Z3A30 30 weeks gestation of pregnancy: Secondary | ICD-10-CM | POA: Insufficient documentation

## 2014-07-13 MED ORDER — BETAMETHASONE SOD PHOS & ACET 6 (3-3) MG/ML IJ SUSP
12.0000 mg | Freq: Once | INTRAMUSCULAR | Status: AC
  Administered 2014-07-13: 12 mg via INTRAMUSCULAR
  Filled 2014-07-13: qty 2

## 2014-07-13 NOTE — MAU Note (Signed)
No complaints from pt, here for 2nd dose of betamethasone

## 2014-07-15 ENCOUNTER — Ambulatory Visit (INDEPENDENT_AMBULATORY_CARE_PROVIDER_SITE_OTHER): Payer: Medicaid Other | Admitting: *Deleted

## 2014-07-15 ENCOUNTER — Ambulatory Visit (HOSPITAL_COMMUNITY)
Admission: RE | Admit: 2014-07-15 | Discharge: 2014-07-15 | Disposition: A | Discharge status: Home / Self Care | Payer: Medicaid Other | Source: Ambulatory Visit | Attending: Obstetrics & Gynecology | Admitting: Obstetrics & Gynecology

## 2014-07-15 VITALS — BP 140/65 | HR 112

## 2014-07-15 DIAGNOSIS — O10913 Unspecified pre-existing hypertension complicating pregnancy, third trimester: Secondary | ICD-10-CM

## 2014-07-15 DIAGNOSIS — O288 Other abnormal findings on antenatal screening of mother: Secondary | ICD-10-CM

## 2014-07-15 DIAGNOSIS — O289 Unspecified abnormal findings on antenatal screening of mother: Secondary | ICD-10-CM | POA: Insufficient documentation

## 2014-07-15 NOTE — Progress Notes (Signed)
NST nonreactive.  BPP 8  Toshiro Hanken L. Harraway-Smith, M.D., [facility]

## 2014-07-15 NOTE — Progress Notes (Signed)
NST/AFI

## 2014-07-15 NOTE — Progress Notes (Signed)
Pt sent to [place] for BPP due to NR NST and FHR variables.

## 2014-07-19 ENCOUNTER — Encounter: Payer: Self-pay | Admitting: *Deleted

## 2014-07-20 ENCOUNTER — Encounter (HOSPITAL_COMMUNITY): Payer: Self-pay | Admitting: *Deleted

## 2014-07-20 ENCOUNTER — Other Ambulatory Visit: Payer: Self-pay

## 2014-07-20 ENCOUNTER — Inpatient Hospital Stay (HOSPITAL_COMMUNITY)
Admission: AD | Admit: 2014-07-20 | Discharge: 2014-07-20 | Disposition: A | Discharge status: Home / Self Care | Payer: Medicaid Other | Source: Ambulatory Visit | Attending: Family Medicine | Admitting: Family Medicine

## 2014-07-20 DIAGNOSIS — O9989 Other specified diseases and conditions complicating pregnancy, childbirth and the puerperium: Secondary | ICD-10-CM | POA: Diagnosis not present

## 2014-07-20 DIAGNOSIS — B356 Tinea cruris: Secondary | ICD-10-CM | POA: Diagnosis not present

## 2014-07-20 DIAGNOSIS — R21 Rash and other nonspecific skin eruption: Secondary | ICD-10-CM | POA: Diagnosis present

## 2014-07-20 DIAGNOSIS — Z3A33 33 weeks gestation of pregnancy: Secondary | ICD-10-CM | POA: Diagnosis not present

## 2014-07-20 LAB — URINALYSIS, ROUTINE W REFLEX MICROSCOPIC
Bilirubin Urine: NEGATIVE
Glucose, UA: NEGATIVE mg/dL
HGB URINE DIPSTICK: NEGATIVE
Ketones, ur: NEGATIVE mg/dL
Nitrite: NEGATIVE
Protein, ur: 30 mg/dL — AB
Urobilinogen, UA: 0.2 mg/dL (ref 0.0–1.0)
pH: 6 (ref 5.0–8.0)

## 2014-07-20 LAB — URINE MICROSCOPIC-ADD ON

## 2014-07-20 MED ORDER — KETOCONAZOLE 2 % EX CREA
1.0000 | TOPICAL_CREAM | Freq: Two times a day (BID) | CUTANEOUS | Status: DC
Start: 2014-07-20 — End: 2014-08-03

## 2014-07-20 NOTE — MAU Note (Signed)
?   Breakout.  States vag area is peeling, first noted on Sunday.  Denies any recent changes in any detergents or soaps, no change in d/c.  Hurts when she showers.

## 2014-07-20 NOTE — Progress Notes (Signed)
Written and verbal d/c instructions given and understanding voiced. 

## 2014-07-20 NOTE — Progress Notes (Signed)
Dr Doroteo Glassman notified of pt's admission and status. Will see pt

## 2014-07-20 NOTE — Progress Notes (Signed)
Dr Doroteo Glassman in to see pt. Perineum with rash and some crustiness. Pt states does not itch. Prob yeast

## 2014-07-20 NOTE — MAU Note (Signed)
Dr Loreta Ave in to see pt. OK to d/c EFM per Dr Doroteo Glassman.

## 2014-07-20 NOTE — MAU Provider Note (Signed)
History   CSN: [id number]  Arrival date and time: 07/20/14 1051   Chief Complaint  Patient presents with  . Vaginal Pain    HPI  Patient is 29 y.o. B1D1761 [redacted]w[redacted]d here with complaints of rash that is located between her legs and extending to her bottom. States she is unable to see the rash but knows it's there because when she showers and washes it is painful. Denies any itching or discharge. Denies ever having rash before. Rates discomfort as 6/10. She has not tried anything for rash.   +FM, denies LOF, VB, contractions, vaginal discharge.   OB History    Gravida Para Term Preterm AB TAB SAB Ectopic Multiple Living   4 2 2  0 1 0 1 0 0 2    -HRC for cHTN   Past Medical History  Diagnosis Date  . Endometriosis   . Depression   . Dizzy spells   . Headache(784.0)   . PTSD (post-traumatic stress disorder)   . Anxiety   . Sickle cell trait   . Hypertension     Past Surgical History  Procedure Laterality Date  . Dilation and curettage of uterus  2008  . Vaginal delivery      X 1  . Tooth extraction      Family History  Problem Relation Age of Onset  . Anesthesia problems Neg Hx   . Diabetes Maternal Grandmother   . Diabetes Maternal Grandfather   . Diabetes Paternal Grandmother   . Diabetes Paternal Grandfather   . Other Cousin     sickle cell disease  . Arthritis Other   . Asthma Other   . Early death Other     uncle-suicide  . Cataracts Other   . Congestive Heart Failure Other   . Hyperlipidemia Other   . Hypertension Other   . Stroke Other   . Migraines Other   . Diabetes Father   . Hypertension Father     History  Substance Use Topics  . Smoking status: Never Smoker   . Smokeless tobacco: Never Used  . Alcohol Use: Yes     Comment: occas. social  LAST  DRANK IN SEPT-2014    Allergies:  Allergies  Allergen Reactions  . Latex Rash    Prescriptions prior to admission  Medication Sig Dispense Refill Last Dose  . acetaminophen (TYLENOL) 500  MG tablet Take 2 tablets (1,000 mg total) by mouth every 8 (eight) hours as needed for mild pain or moderate pain. 10 tablet 0 Past Week at Unknown time  . cyclobenzaprine (FLEXERIL) 5 MG tablet Take 1 tablet (5 mg total) by mouth 3 (three) times daily as needed for muscle spasms. 20 tablet 0 Past Week at Unknown time  . Prenat-FeFum-FePo-FA-Omega 3 (CONCEPT DHA) 53.5-38-1 MG CAPS Take 1 tablet by mouth daily. 30 capsule 5 07/20/2014 at Unknown time  . sertraline (ZOLOFT) 100 MG tablet Take 100 mg by mouth daily.   07/20/2014 at Unknown time  . valACYclovir (VALTREX) 1000 MG tablet Take 1 tablet (1,000 mg total) by mouth 2 (two) times daily. (Patient not taking: Reported on 06/07/2014) 14 tablet 0 Not Taking    Review of Systems  Constitutional: Negative for fever and chills.  Eyes: Negative for blurred vision and double vision.  Respiratory: Negative for shortness of breath.   Cardiovascular: Negative for chest pain and leg swelling.  Gastrointestinal: Negative for nausea, vomiting and abdominal pain.  Genitourinary: Negative for dysuria.  Neurological: Positive for headaches. Negative for dizziness.  Physical Exam   Blood pressure 137/78, pulse 119, temperature 98.7 F (37.1 C), temperature source Oral, resp. rate 20, weight 234 lb (106.142 kg), not currently breastfeeding.  Physical Exam  Constitutional: She is oriented to person, place, and time. She appears well-developed and well-nourished. No distress.  HENT:  Head: Normocephalic and atraumatic.  Eyes: EOM are normal.  Cardiovascular: Regular rhythm, normal heart sounds and intact distal pulses.  Tachycardia present.   Respiratory: Effort normal and breath sounds normal.  GI: Soft. Bowel sounds are normal. There is no tenderness.  Genitourinary:  Scaly raised salmon border that spreads down the inner thighs from the groin and to the rectum.   Musculoskeletal: Normal range of motion. She exhibits no edema or tenderness.   Neurological: She is alert and oriented to person, place, and time.  Skin: Skin is warm and dry. Rash noted.   MAU Course  Procedures - None  MDM: -UA pending -NST reassuring and reactive -Toco with some irritability   Assessment and Plan  A: Patient is 29 y.o. Z6X0960 [redacted]w[redacted]d reporting rash likely secondary to tinea cruris.  P: Discharge home - Reviewed findings and my conclusion - Rx for ketoconazole cream to use BID  - encouraged patient to stay hydrated - fetal kick counts reinforced - preterm labor precautions dicussed - Handout given - Follow-up with [facility] provider on Thursday  Caryl Ada, DO 07/20/2014, 12:49 PM PGY-1,  Family Medicine   OB fellow attestation:  I have seen and examined this patient; I agree with above documentation in the resident's note.   Maecie Sevcik is a 29 y.o. A5W0981 reporting inguinal rash extending to butt. +FM, denies LOF, VB, contractions, vaginal discharge.  PE: BP 147/82 mmHg  Pulse 107  Temp(Src) 98 F (36.7 C) (Oral)  Resp 18  Wt 234 lb (106.142 kg)  LMP  (LMP Unknown) Gen: calm comfortable, NAD Resp: normal effort, no distress Abd: gravid  ROS, labs, PMH reviewed NST reactive  Plan: - fetal kick counts reinforced, preterm labor precautions - rx ketoconazole cream BID for tinea cruris, advised against baby powder or other starch containing products as will likely worsen symptoms. - continue routine follow up in OB clinic  Perry Mount, MD 5:41 PM

## 2014-07-20 NOTE — Progress Notes (Signed)
Dr Doroteo Glassman aware of elevated maternal HR

## 2014-07-20 NOTE — Discharge Instructions (Signed)
Separate Handout given on tinea cruris

## 2014-07-22 ENCOUNTER — Inpatient Hospital Stay (HOSPITAL_COMMUNITY)
Admission: AD | Admit: 2014-07-22 | Discharge: 2014-07-22 | Disposition: A | Discharge status: Home / Self Care | Payer: Medicaid Other | Source: Ambulatory Visit | Attending: Obstetrics & Gynecology | Admitting: Obstetrics & Gynecology

## 2014-07-22 ENCOUNTER — Ambulatory Visit (INDEPENDENT_AMBULATORY_CARE_PROVIDER_SITE_OTHER): Payer: Medicaid Other | Admitting: *Deleted

## 2014-07-22 ENCOUNTER — Ambulatory Visit (HOSPITAL_COMMUNITY)
Admission: RE | Admit: 2014-07-22 | Discharge: 2014-07-22 | Disposition: A | Discharge status: Home / Self Care | Payer: Medicaid Other | Source: Ambulatory Visit | Attending: Family Medicine | Admitting: Family Medicine

## 2014-07-22 ENCOUNTER — Encounter: Payer: Self-pay | Admitting: *Deleted

## 2014-07-22 ENCOUNTER — Encounter (HOSPITAL_COMMUNITY): Payer: Self-pay | Admitting: *Deleted

## 2014-07-22 ENCOUNTER — Ambulatory Visit (HOSPITAL_COMMUNITY)
Admission: RE | Admit: 2014-07-22 | Discharge: 2014-07-22 | Disposition: A | Discharge status: Home / Self Care | Payer: Medicaid Other | Source: Ambulatory Visit | Attending: Obstetrics & Gynecology | Admitting: Obstetrics & Gynecology

## 2014-07-22 ENCOUNTER — Inpatient Hospital Stay (HOSPITAL_COMMUNITY): Payer: Medicaid Other

## 2014-07-22 ENCOUNTER — Ambulatory Visit (HOSPITAL_COMMUNITY): Payer: Self-pay

## 2014-07-22 ENCOUNTER — Other Ambulatory Visit: Payer: Self-pay | Admitting: Advanced Practice Midwife

## 2014-07-22 VITALS — BP 146/77 | HR 103

## 2014-07-22 DIAGNOSIS — O283 Abnormal ultrasonic finding on antenatal screening of mother: Secondary | ICD-10-CM | POA: Diagnosis not present

## 2014-07-22 DIAGNOSIS — O288 Other abnormal findings on antenatal screening of mother: Secondary | ICD-10-CM

## 2014-07-22 DIAGNOSIS — O10913 Unspecified pre-existing hypertension complicating pregnancy, third trimester: Secondary | ICD-10-CM

## 2014-07-22 DIAGNOSIS — Z3A33 33 weeks gestation of pregnancy: Secondary | ICD-10-CM | POA: Insufficient documentation

## 2014-07-22 DIAGNOSIS — O352XX0 Maternal care for (suspected) hereditary disease in fetus, not applicable or unspecified: Secondary | ICD-10-CM | POA: Insufficient documentation

## 2014-07-22 DIAGNOSIS — O1403 Mild to moderate pre-eclampsia, third trimester: Secondary | ICD-10-CM | POA: Diagnosis not present

## 2014-07-22 DIAGNOSIS — Z9289 Personal history of other medical treatment: Secondary | ICD-10-CM | POA: Diagnosis not present

## 2014-07-22 DIAGNOSIS — B009 Herpesviral infection, unspecified: Secondary | ICD-10-CM

## 2014-07-22 DIAGNOSIS — O10919 Unspecified pre-existing hypertension complicating pregnancy, unspecified trimester: Secondary | ICD-10-CM | POA: Insufficient documentation

## 2014-07-22 DIAGNOSIS — O119 Pre-existing hypertension with pre-eclampsia, unspecified trimester: Secondary | ICD-10-CM | POA: Insufficient documentation

## 2014-07-22 DIAGNOSIS — O0992 Supervision of high risk pregnancy, unspecified, second trimester: Secondary | ICD-10-CM

## 2014-07-22 LAB — URINALYSIS, ROUTINE W REFLEX MICROSCOPIC
BILIRUBIN URINE: NEGATIVE
Glucose, UA: NEGATIVE mg/dL
Hgb urine dipstick: NEGATIVE
Ketones, ur: 40 mg/dL — AB
NITRITE: NEGATIVE
PROTEIN: 30 mg/dL — AB
Specific Gravity, Urine: >1.03 — ABNORMAL HIGH (ref 1.005–1.030)
UROBILINOGEN UA: 0.2 mg/dL (ref 0.0–1.0)
pH: 6 (ref 5.0–8.0)

## 2014-07-22 LAB — URINE MICROSCOPIC-ADD ON

## 2014-07-22 MED ORDER — VALACYCLOVIR HCL 500 MG PO TABS: 500.0000 mg | ORAL_TABLET | Freq: Two times a day (BID) | ORAL | Status: DC

## 2014-07-22 MED ORDER — CONCEPT DHA 53.5-38-1 MG PO CAPS: 1.0000 | ORAL_CAPSULE | Freq: Every day | ORAL | Status: DC

## 2014-07-22 MED ORDER — BETAMETHASONE SOD PHOS & ACET 6 (3-3) MG/ML IJ SUSP
12.0000 mg | Freq: Once | INTRAMUSCULAR | Status: DC
  Filled 2014-07-22: qty 2

## 2014-07-22 NOTE — Progress Notes (Signed)
BPP today due to NR NST - Dr. Anyanwu notified.  

## 2014-07-22 NOTE — MAU Provider Note (Signed)
First Provider Initiated Contact with Patient 07/22/14 1652      Chief Complaint:  Non reactive NST (monitoring for mild preeclampsia)  Glorimar Stroope is  29 y.o. D3O6712 at 66w4dsent from clinic for a BPP following a nonreative (but reassuring variability) NST.  BPP was 6/8 (no breathing movements).  She underwent contnuous EFM for 6 hours, during which the NST never met criteria for being reactive.  No decels and moderate variability throughout. Discussed with Dr. AHarolyn Rutherford who recommended repeat BPP 6 hours later:  Now 8/10  Obstetrical/Gynecological History: OB History    Gravida Para Term Preterm AB TAB SAB Ectopic Multiple Living   4 2 2  0 1 0 1 0 0 2     Past Medical History: Past Medical History  Diagnosis Date  . Endometriosis   . Depression   . Dizzy spells   . Headache(784.0)   . PTSD (post-traumatic stress disorder)   . Anxiety   . Sickle cell trait   . Hypertension     Past Surgical History: Past Surgical History  Procedure Laterality Date  . Dilation and curettage of uterus  2008  . Vaginal delivery      X 1  . Tooth extraction      Family History: Family History  Problem Relation Age of Onset  . Anesthesia problems Neg Hx   . Diabetes Maternal Grandmother   . Diabetes Maternal Grandfather   . Diabetes Paternal Grandmother   . Diabetes Paternal Grandfather   . Other Cousin     sickle cell disease  . Arthritis Other   . Asthma Other   . Early death Other     uncle-suicide  . Cataracts Other   . Congestive Heart Failure Other   . Hyperlipidemia Other   . Hypertension Other   . Stroke Other   . Migraines Other   . Diabetes Father   . Hypertension Father     Social History: History  Substance Use Topics  . Smoking status: Never Smoker   . Smokeless tobacco: Never Used  . Alcohol Use: Yes     Comment: occas. social  LAST  DRANK IN SEPT-2014    Allergies:  Allergies  Allergen Reactions  . Latex Hives and Rash    Meds:  Prescriptions  prior to admission  Medication Sig Dispense Refill Last Dose  . acetaminophen (TYLENOL) 500 MG tablet Take 2 tablets (1,000 mg total) by mouth every 8 (eight) hours as needed for mild pain or moderate pain. (Patient taking differently: Take 1,000 mg by mouth every 6 (six) hours as needed for mild pain, moderate pain or headache. ) 10 tablet 0 Past Month at Unknown time  . ketoconazole (NIZORAL) 2 % cream Apply 1 application topically 2 (two) times daily. To affected area. 60 g 1 07/22/2014 at Unknown time  . Prenat-FeFum-FePo-FA-Omega 3 (CONCEPT DHA) 53.5-38-1 MG CAPS Take 1 tablet by mouth daily. 30 capsule 5 07/22/2014 at Unknown time  . sertraline (ZOLOFT) 100 MG tablet Take 100 mg by mouth daily.   07/22/2014 at Unknown time  . valACYclovir (VALTREX) 500 MG tablet Take 1 tablet (500 mg total) by mouth 2 (two) times daily. 30 tablet 6   . cyclobenzaprine (FLEXERIL) 5 MG tablet Take 1 tablet (5 mg total) by mouth 3 (three) times daily as needed for muscle spasms. (Patient not taking: Reported on 07/22/2014) 20 tablet 0 Completed Course at Unknown time    Review of Systems   Constitutional: Negative for fever and chills  Eyes: Negative for visual disturbances Respiratory: Negative for shortness of breath, dyspnea Cardiovascular: Negative for chest pain or palpitations  Gastrointestinal: Negative for vomiting, diarrhea and constipation Genitourinary: Negative for dysuria and urgency Musculoskeletal: Negative for back pain, joint pain, myalgias.  Normal ROM  Neurological: Negative for dizziness and headaches    Physical Exam  Blood pressure 111/65, pulse 129, temperature 98.5 F (36.9 C), temperature source Oral, resp. rate 18, SpO2 100 %, not currently breastfeeding. GENERAL: Well-developed, well-nourished female in no acute distress.  LUNGS: Clear to auscultation bilaterally.  HEART: Regular rate and rhythm. ABDOMEN: Soft, nontender, nondistended, gravid.  EXTREMITIES: Nontender, no edema,  2+ distal pulses. DTR's 2+ FHT:  Baseline rate 145-155 bpm   Variability moderate  Accelerations: 10x10 only present   Decelerations none Contractions: Every 0 mins   Labs: Results for orders placed or performed during the hospital encounter of 07/22/14 (from the past 24 hour(s))  Urinalysis, Routine w reflex microscopic (not at [facility])   Collection Time: 07/22/14  4:30 PM  Result Value Ref Range   Color, Urine YELLOW YELLOW   APPearance CLEAR CLEAR   Specific Gravity, Urine >1.030 (H) 1.005 - 1.030   pH 6.0 5.0 - 8.0   Glucose, UA NEGATIVE NEGATIVE mg/dL   Hgb urine dipstick NEGATIVE NEGATIVE   Bilirubin Urine NEGATIVE NEGATIVE   Ketones, ur 40 (A) NEGATIVE mg/dL   Protein, ur 30 (A) NEGATIVE mg/dL   Urobilinogen, UA 0.2 0.0 - 1.0 mg/dL   Nitrite NEGATIVE NEGATIVE   Leukocytes, UA MODERATE (A) NEGATIVE  Urine microscopic-add on   Collection Time: 07/22/14  4:30 PM  Result Value Ref Range   Squamous Epithelial / LPF FEW (A) RARE   WBC, UA 7-10 <3 WBC/hpf   Urine-Other MUCOUS PRESENT    Imaging Studies:    Assessment: Kaye Luoma is  29 y.o. [id number] at 19w4dpresents with mild preeclampsia 6/10 BPP (no breathing) Repeat BPP (6 hours later) 8/10..Marland Kitchen Plan: D/C home.  Kick counts daily. KF/U Monday as scheduled  CRESENZO-DISHMAN,Kazia Grisanti 6/16/201610:37 PM

## 2014-07-22 NOTE — Progress Notes (Signed)
Non reactive NST, sent for BPP, will follow up results and manage accordingly.

## 2014-07-22 NOTE — MAU Note (Addendum)
Pt came from [facility] for extended monitoring due to BPP 6/8.  Pt has been having elevated BP's.  Reports good fetal movement.  Denies LOF or abnormal discharge/vaginal bleeding.

## 2014-07-22 NOTE — Discharge Instructions (Signed)

## 2014-07-23 DIAGNOSIS — O10919 Unspecified pre-existing hypertension complicating pregnancy, unspecified trimester: Secondary | ICD-10-CM | POA: Insufficient documentation

## 2014-07-23 MED ORDER — ACETAMINOPHEN 500 MG PO TABS: 1000.0000 mg | ORAL_TABLET | Freq: Three times a day (TID) | ORAL | Status: DC | PRN

## 2014-07-23 NOTE — Addendum Note (Signed)
Addended by: Sherre Lain A on: 07/23/2014 11:11 AM   Modules accepted: Orders

## 2014-07-26 ENCOUNTER — Ambulatory Visit (INDEPENDENT_AMBULATORY_CARE_PROVIDER_SITE_OTHER): Payer: Medicaid Other | Admitting: Obstetrics and Gynecology

## 2014-07-26 VITALS — BP 148/71 | HR 118 | Wt 236.0 lb

## 2014-07-26 DIAGNOSIS — O10913 Unspecified pre-existing hypertension complicating pregnancy, third trimester: Secondary | ICD-10-CM

## 2014-07-26 LAB — POCT URINALYSIS DIP (DEVICE)
Bilirubin Urine: NEGATIVE
Glucose, UA: NEGATIVE mg/dL
Hgb urine dipstick: NEGATIVE
KETONES UR: NEGATIVE mg/dL
NITRITE: NEGATIVE
PH: 7 (ref 5.0–8.0)
PROTEIN: 30 mg/dL — AB
SPECIFIC GRAVITY, URINE: 1.015 (ref 1.005–1.030)
Urobilinogen, UA: 0.2 mg/dL (ref 0.0–1.0)

## 2014-07-26 NOTE — Progress Notes (Signed)
Subjective:  Danielle [name] is a 30 y.o. O9G2952 at [redacted]w[redacted]d being seen today for ongoing prenatal care.  Patient reports no concerns.  Contractions: Not present.  Vag. Bleeding: None. Movement: Present. Denies leaking of fluid.   The following portions of the patient's history were reviewed and updated as appropriate: allergies, current medications, past family history, past medical history, past social history, past surgical history and problem list.   Objective:   Filed Vitals:   07/26/14 1311  BP: 148/71  Pulse: 118  Weight: 236 lb (107.049 kg)    Fetal Status: Fetal Heart Rate (bpm): NST   Movement: Present     General:  Alert, oriented and cooperative. Patient is in no acute distress.  Skin: Skin is warm and dry. No rash noted.   Cardiovascular: Normal heart rate noted  Respiratory: Effort and breath sounds normal, no problems with respiration noted  Abdomen: Soft, gravid, appropriate for gestational age. Pain/Pressure: Absent     Vaginal: Vag. Bleeding: None.       Cervix: Not evaluated       Extremities: Normal range of motion.  Edema: None  Mental Status: Normal mood and affect. Normal behavior. Normal judgment and thought content.   Urinalysis:     NST reviewed [place] 145-150, reactive w/o decelerations Assessment and Plan:  Pregnancy: [id number] at [redacted]w[redacted]d  1. Chronic hypertension in pregnancy, third trimester BP normal off meds since April Superimposed preeclampsia was based on P:C ratio .32 Fetal testing reassuring   Preterm labor symptoms and general obstetric precautions including but not limited to vaginal bleeding, contractions, leaking of fluid and fetal movement were reviewed in detail with the patient.  Please refer to After Visit Summary for other counseling recommendations.   Return in about 7 days (around 08/02/2014) for OBF/NST. Has MFM [place] in 3 d Will get 24 hr urine  Reginald Weida Colin Mulders, CNM

## 2014-07-26 NOTE — Patient Instructions (Signed)
Contraception Choices Contraception (birth control) is the use of any methods or devices to prevent pregnancy. Below are some methods to help avoid pregnancy. HORMONAL METHODS   Contraceptive implant. This is a thin, plastic tube containing progesterone hormone. It does not contain estrogen hormone. Your health care provider inserts the tube in the inner part of the upper arm. The tube can remain in place for up to 3 years. After 3 years, the implant must be removed. The implant prevents the ovaries from releasing an egg (ovulation), thickens the cervical mucus to prevent sperm from entering the uterus, and thins the lining of the inside of the uterus.  Progesterone-only injections. These injections are given every 3 months by your health care provider to prevent pregnancy. This synthetic progesterone hormone stops the ovaries from releasing eggs. It also thickens cervical mucus and changes the uterine lining. This makes it harder for sperm to survive in the uterus.  Birth control pills. These pills contain estrogen and progesterone hormone. They work by preventing the ovaries from releasing eggs (ovulation). They also cause the cervical mucus to thicken, preventing the sperm from entering the uterus. Birth control pills are prescribed by a health care provider.Birth control pills can also be used to treat heavy periods.  Minipill. This type of birth control pill contains only the progesterone hormone. They are taken every day of each month and must be prescribed by your health care provider.  Birth control patch. The patch contains hormones similar to those in birth control pills. It must be changed once a week and is prescribed by a health care provider.  Vaginal ring. The ring contains hormones similar to those in birth control pills. It is left in the vagina for 3 weeks, removed for 1 week, and then a new one is put back in place. The patient must be comfortable inserting and removing the ring  from the vagina.A health care provider's prescription is necessary.  Emergency contraception. Emergency contraceptives prevent pregnancy after unprotected sexual intercourse. This pill can be taken right after sex or up to 5 days after unprotected sex. It is most effective the sooner you take the pills after having sexual intercourse. Most emergency contraceptive pills are available without a prescription. Check with your pharmacist. Do not use emergency contraception as your only form of birth control. BARRIER METHODS   Female condom. This is a thin sheath (latex or rubber) that is worn over the penis during sexual intercourse. It can be used with spermicide to increase effectiveness.  Female condom. This is a soft, loose-fitting sheath that is put into the vagina before sexual intercourse.  Diaphragm. This is a soft, latex, dome-shaped barrier that must be fitted by a health care provider. It is inserted into the vagina, along with a spermicidal jelly. It is inserted before intercourse. The diaphragm should be left in the vagina for 6 to 8 hours after intercourse.  Cervical cap. This is a round, soft, latex or plastic cup that fits over the cervix and must be fitted by a health care provider. The cap can be left in place for up to 48 hours after intercourse.  Sponge. This is a soft, circular piece of polyurethane foam. The sponge has spermicide in it. It is inserted into the vagina after wetting it and before sexual intercourse.  Spermicides. These are chemicals that kill or block sperm from entering the cervix and uterus. They come in the form of creams, jellies, suppositories, foam, or tablets. They do not require a   prescription. They are inserted into the vagina with an applicator before having sexual intercourse. The process must be repeated every time you have sexual intercourse. INTRAUTERINE CONTRACEPTION  Intrauterine device (IUD). This is a T-shaped device that is put in a woman's uterus  during a menstrual period to prevent pregnancy. There are 2 types:  Copper IUD. This type of IUD is wrapped in copper wire and is placed inside the uterus. Copper makes the uterus and fallopian tubes produce a fluid that kills sperm. It can stay in place for 10 years.  Hormone IUD. This type of IUD contains the hormone progestin (synthetic progesterone). The hormone thickens the cervical mucus and prevents sperm from entering the uterus, and it also thins the uterine lining to prevent implantation of a fertilized egg. The hormone can weaken or kill the sperm that get into the uterus. It can stay in place for 3-5 years, depending on which type of IUD is used. PERMANENT METHODS OF CONTRACEPTION  Female tubal ligation. This is when the woman's fallopian tubes are surgically sealed, tied, or blocked to prevent the egg from traveling to the uterus.  Hysteroscopic sterilization. This involves placing a small coil or insert into each fallopian tube. Your doctor uses a technique called hysteroscopy to do the procedure. The device causes scar tissue to form. This results in permanent blockage of the fallopian tubes, so the sperm cannot fertilize the egg. It takes about 3 months after the procedure for the tubes to become blocked. You must use another form of birth control for these 3 months.  Female sterilization. This is when the female has the tubes that carry sperm tied off (vasectomy).This blocks sperm from entering the vagina during sexual intercourse. After the procedure, the man can still ejaculate fluid (semen). NATURAL PLANNING METHODS  Natural family planning. This is not having sexual intercourse or using a barrier method (condom, diaphragm, cervical cap) on days the woman could become pregnant.  Calendar method. This is keeping track of the length of each menstrual cycle and identifying when you are fertile.  Ovulation method. This is avoiding sexual intercourse during ovulation.  Symptothermal  method. This is avoiding sexual intercourse during ovulation, using a thermometer and ovulation symptoms.  Post-ovulation method. This is timing sexual intercourse after you have ovulated. Regardless of which type or method of contraception you choose, it is important that you use condoms to protect against the transmission of sexually transmitted infections (STIs). Talk with your health care provider about which form of contraception is most appropriate for you. Document Released: 01/22/2005 Document Revised: 01/27/2013 Document Reviewed: 07/17/2012 Exit[employer] Patient Information 2015 [employer]. This information is not intended to replace advice given to you by your health care provider. Make sure you discuss any questions you have with your health care provider.  

## 2014-07-26 NOTE — Progress Notes (Signed)
Breastfeeding tip of the week reviewed OBF/NST

## 2014-07-29 ENCOUNTER — Ambulatory Visit (HOSPITAL_COMMUNITY)
Admission: RE | Admit: 2014-07-29 | Discharge: 2014-07-29 | Disposition: A | Discharge status: Home / Self Care | Payer: Medicaid Other | Source: Ambulatory Visit | Attending: Family Medicine | Admitting: Family Medicine

## 2014-07-29 DIAGNOSIS — O352XX0 Maternal care for (suspected) hereditary disease in fetus, not applicable or unspecified: Secondary | ICD-10-CM | POA: Insufficient documentation

## 2014-07-29 DIAGNOSIS — Z3A34 34 weeks gestation of pregnancy: Secondary | ICD-10-CM | POA: Diagnosis not present

## 2014-07-29 DIAGNOSIS — O10913 Unspecified pre-existing hypertension complicating pregnancy, third trimester: Secondary | ICD-10-CM

## 2014-07-30 ENCOUNTER — Telehealth: Payer: Self-pay | Admitting: *Deleted

## 2014-07-30 NOTE — Telephone Encounter (Signed)
Patient left voicemail message at 8:56 am on 07/30/14.  Patient asks about whether induction has been scheduled yet and how to go about getting it scheduled if it hasn't been yet.  Attempted to contact patient at [phone].  Received message that patient's voicemail box is full.  Will try to call again later.

## 2014-08-01 ENCOUNTER — Encounter: Payer: Self-pay | Admitting: Family

## 2014-08-01 ENCOUNTER — Encounter: Payer: Self-pay | Admitting: Obstetrics & Gynecology

## 2014-08-02 LAB — COMPREHENSIVE METABOLIC PANEL
ALBUMIN: 3 g/dL — AB (ref 3.5–5.2)
ALT: <8 U/L (ref 0–35)
AST: 12 U/L (ref 0–37)
Alkaline Phosphatase: 83 U/L (ref 39–117)
BUN: 4 mg/dL — AB (ref 6–23)
CALCIUM: 8.9 mg/dL (ref 8.4–10.5)
CO2: 23 mEq/L (ref 19–32)
CREATININE: 0.45 mg/dL — AB (ref 0.50–1.10)
Chloride: 103 mEq/L (ref 96–112)
Glucose, Bld: 96 mg/dL (ref 70–99)
POTASSIUM: 4.4 meq/L (ref 3.5–5.3)
SODIUM: 136 meq/L (ref 135–145)
Total Bilirubin: 0.3 mg/dL (ref 0.2–1.2)
Total Protein: 6 g/dL (ref 6.0–8.3)

## 2014-08-02 LAB — HEMOGLOBIN A1C
Hgb A1c MFr Bld: 5.9 % — ABNORMAL HIGH (ref <5.7)
Mean Plasma Glucose: 123 mg/dL — ABNORMAL HIGH (ref <117)

## 2014-08-02 NOTE — Telephone Encounter (Signed)
See my chart message

## 2014-08-02 NOTE — Addendum Note (Signed)
Addended by: Kathee DeltonHILLMAN, CARRIE L on: 08/02/2014 02:11 PM   Modules accepted: Orders

## 2014-08-03 ENCOUNTER — Ambulatory Visit (INDEPENDENT_AMBULATORY_CARE_PROVIDER_SITE_OTHER): Payer: Medicaid Other | Admitting: *Deleted

## 2014-08-03 ENCOUNTER — Encounter (HOSPITAL_COMMUNITY): Payer: Self-pay | Admitting: *Deleted

## 2014-08-03 ENCOUNTER — Inpatient Hospital Stay (HOSPITAL_COMMUNITY)
Admission: AD | Admit: 2014-08-03 | Discharge: 2014-08-03 | Disposition: A | Discharge status: Home / Self Care | Payer: Medicaid Other | Source: Ambulatory Visit | Attending: Family Medicine | Admitting: Family Medicine

## 2014-08-03 VITALS — BP 137/84 | HR 113

## 2014-08-03 DIAGNOSIS — Z3A35 35 weeks gestation of pregnancy: Secondary | ICD-10-CM | POA: Insufficient documentation

## 2014-08-03 DIAGNOSIS — O0992 Supervision of high risk pregnancy, unspecified, second trimester: Secondary | ICD-10-CM

## 2014-08-03 DIAGNOSIS — R51 Headache: Secondary | ICD-10-CM

## 2014-08-03 DIAGNOSIS — O9989 Other specified diseases and conditions complicating pregnancy, childbirth and the puerperium: Secondary | ICD-10-CM | POA: Diagnosis not present

## 2014-08-03 DIAGNOSIS — R519 Headache, unspecified: Secondary | ICD-10-CM

## 2014-08-03 DIAGNOSIS — O119 Pre-existing hypertension with pre-eclampsia, unspecified trimester: Secondary | ICD-10-CM

## 2014-08-03 DIAGNOSIS — O10913 Unspecified pre-existing hypertension complicating pregnancy, third trimester: Secondary | ICD-10-CM

## 2014-08-03 DIAGNOSIS — O159 Eclampsia, unspecified as to time period: Secondary | ICD-10-CM | POA: Diagnosis not present

## 2014-08-03 DIAGNOSIS — O26893 Other specified pregnancy related conditions, third trimester: Secondary | ICD-10-CM

## 2014-08-03 LAB — URINALYSIS, ROUTINE W REFLEX MICROSCOPIC
Bilirubin Urine: NEGATIVE
GLUCOSE, UA: NEGATIVE mg/dL
Ketones, ur: 15 mg/dL — AB
Nitrite: NEGATIVE
PROTEIN: 100 mg/dL — AB
Specific Gravity, Urine: >1.03 — ABNORMAL HIGH (ref 1.005–1.030)
Urobilinogen, UA: 0.2 mg/dL (ref 0.0–1.0)
pH: 6 (ref 5.0–8.0)

## 2014-08-03 LAB — PROTEIN, URINE, 24 HOUR
PROTEIN 24H UR: 567 mg/d — AB (ref <150)
Protein, Urine: 54 mg/dL — ABNORMAL HIGH (ref 5–24)

## 2014-08-03 LAB — URINE MICROSCOPIC-ADD ON

## 2014-08-03 LAB — CREATININE CLEARANCE, URINE, 24 HOUR
Creatinine Clearance: 257 mL/min — ABNORMAL HIGH (ref 75–115)
Creatinine, 24H Ur: 1666 mg/d (ref 700–1800)
Creatinine, Urine: 158.7 mg/dL
Creatinine: 0.45 mg/dL — ABNORMAL LOW (ref 0.50–1.10)

## 2014-08-03 MED ORDER — BUTALBITAL-APAP-CAFFEINE 50-325-40 MG PO TABS
1.0000 | ORAL_TABLET | Freq: Once | ORAL | Status: AC
  Administered 2014-08-03: 1 via ORAL
  Filled 2014-08-03: qty 1

## 2014-08-03 NOTE — MAU Note (Signed)
Dr. Doroteo GlassmanPhelps to call back and received report as she is in a delivery.

## 2014-08-03 NOTE — MAU Note (Signed)
Pt states that she has been feeling "unwell" today and that her BP has been increasing over the past month. A 24hr urine was done in the office and submitted yesterday. Pt denies LOF and states that baby is not usually very active.

## 2014-08-03 NOTE — Discharge Instructions (Signed)

## 2014-08-03 NOTE — MAU Note (Signed)
Dr. Doroteo GlassmanPhelps given report and coming to unit to assess patient.

## 2014-08-03 NOTE — MAU Note (Signed)
PT  SAYS  SHE  GETS  [facility] IN CLINIC - AND BP HAS BEEN HIGH-  LABS  WITH  DX- WITH PRECLAMPSIA-  BUT  NO MEDS-  .  IS  GETTING  INDUCED    ON 7-10  .  HAS H/A - STARTED  AT  7PM-    TOOK TYLENOL  -  NO RELIEF.  SAYS  FEET AND HANDS   ARE SWOLLEN.

## 2014-08-03 NOTE — Progress Notes (Signed)
NST reactive.

## 2014-08-03 NOTE — MAU Provider Note (Signed)
History   CSN: [id number]  Arrival date and time: 08/03/14 1951  HPI Patient is 29 y.o. [id number] 704w2d here with complaints of headache, hot flashes, and hand/feet swelling. She states that her headache started around 6pm today. She has a h/o migraines but has not had one in a long time. She says light makes headache worse. She tried taking tylenol with no relief. Also endorsing some pelvic pressure but denies contractions.   Patient previously seen in MAU today for NST.  OB History    Gravida Para Term Preterm AB TAB SAB Ectopic Multiple Living   4 2 2  0 1 0 1 0 0 2    -HRC  Past Medical History  Diagnosis Date  . Endometriosis   . Depression   . Dizzy spells   . Headache(784.0)   . PTSD (post-traumatic stress disorder)   . Anxiety   . Sickle cell trait   . Hypertension     Past Surgical History  Procedure Laterality Date  . Dilation and curettage of uterus  2008  . Vaginal delivery      X 1  . Tooth extraction      Family History  Problem Relation Age of Onset  . Anesthesia problems Neg Hx   . Diabetes Maternal Grandmother   . Diabetes Maternal Grandfather   . Diabetes Paternal Grandmother   . Diabetes Paternal Grandfather   . Other Cousin     sickle cell disease  . Arthritis Other   . Asthma Other   . Early death Other     uncle-suicide  . Cataracts Other   . Congestive Heart Failure Other   . Hyperlipidemia Other   . Hypertension Other   . Stroke Other   . Migraines Other   . Diabetes Father   . Hypertension Father     History  Substance Use Topics  . Smoking status: Never Smoker   . Smokeless tobacco: Never Used  . Alcohol Use: Yes     Comment: occas. social  LAST  DRANK IN SEPT-2014    Allergies:  Allergies  Allergen Reactions  . Latex Hives and Rash    Prescriptions prior to admission  Medication Sig Dispense Refill Last Dose  . acetaminophen (TYLENOL) 500 MG tablet Take 2 tablets (1,000 mg total) by mouth every 8 (eight) hours as  needed for mild pain or moderate pain. (Patient taking differently: Take 500 mg by mouth every 8 (eight) hours as needed for mild pain or moderate pain. ) 60 tablet 0 08/03/2014 at 1800  . cyclobenzaprine (FLEXERIL) 5 MG tablet Take 1 tablet (5 mg total) by mouth 3 (three) times daily as needed for muscle spasms. 20 tablet 0 Past Month at Unknown time  . Prenat-FeFum-FePo-FA-Omega 3 (CONCEPT DHA) 53.5-38-1 MG CAPS Take 1 tablet by mouth daily. 30 capsule 5 08/03/2014 at Unknown time  . sertraline (ZOLOFT) 100 MG tablet Take 100 mg by mouth daily.   08/03/2014 at 0900  . valACYclovir (VALTREX) 500 MG tablet Take 1 tablet (500 mg total) by mouth 2 (two) times daily. 30 tablet 6 08/03/2014 at 0900  . ketoconazole (NIZORAL) 2 % cream Apply 1 application topically 2 (two) times daily. To affected area. (Patient not taking: Reported on 08/03/2014) 60 g 1 Not Taking at Unknown time    Review of Systems  Constitutional: Negative for fever.  Eyes: Positive for photophobia. Negative for blurred vision.  Respiratory: Negative for shortness of breath.   Cardiovascular: Positive for leg swelling. Negative for  chest pain.  Gastrointestinal: Negative for nausea and vomiting.  Genitourinary: Negative for dysuria.  Neurological: Positive for headaches. Negative for dizziness.  +FM, denies LOF, VB, contractions, vaginal discharge. Also per HPI  Physical Exam   Blood pressure 129/71, pulse 97, temperature 98.1 F (36.7 C), temperature source Oral, resp. rate 18, height  (1.676 m), weight 244 lb 8 oz (110.904 kg), not currently breastfeeding.  Physical Exam  Constitutional: She is oriented to person, place, and time. She appears well-developed and well-nourished. No distress.  HENT:  Head: Normocephalic and atraumatic.  Eyes: EOM are normal. Pupils are equal, round, and reactive to light.  Cardiovascular: Normal rate, regular rhythm, normal heart sounds and intact distal pulses.   Respiratory: Effort  normal and breath sounds normal.  GI: Soft. Bowel sounds are normal. There is no tenderness.  Musculoskeletal: Normal range of motion. She exhibits no edema or tenderness.  Neurological: She is alert and oriented to person, place, and time. She has normal reflexes.  Skin: Skin is warm and dry.   Dilation: 2 Effacement (%): 50 Cervical Position: Posterior Station: Ballotable Presentation: Vertex Exam by:: Dr. Doroteo Glassman Faculty  MAU Course  Procedures - None  MDM: -NST reassuring and reactive - s/p Fioricet x1 >improvement of HA - UA with signs of dehydration and protein  Assessment and Plan  A: Patient is 29 y.o. [id number] [redacted]w[redacted]d reporting headache likely secondary to migraine history. Other symptoms were benign. No swelling appreciated on exam. No signs of preterm labor. BPS remained wnl.   P: Discharge home pt stable - Reviewed findings and my conclusion - fetal kick counts reinforced - preterm labor precautions dicussed - Handout given - Follow-up with OB provider on Thursday   Caryl Ada, DO 08/03/2014, 10:27 PM PGY-1, [facility] CNM attestation:  I have seen and examined this patient; I agree with above documentation in the resident's note.    Tawnya Crook, CNM 2:27 AM

## 2014-08-04 NOTE — MAU Provider Note (Signed)
HPI Patient is 29 y.o. Danielle [name] [redacted]Danielle [name] here with complaints of headache, hot flashes, and hand/feet swelling. She states that her headache started around 6pm today. She has a h/o migraines but has not had one in a long time. She says light makes headache worse. She tried taking tylenol with no relief. Also endorsing some pelvic pressure but denies contractions.   Patient previously seen in MAU today for NST.  OB History    Gravida Para Term Preterm AB TAB SAB Ectopic Multiple Living   0 1 0 1 0 0 2    -HRC  Past Medical History  Diagnosis Date  . Endometriosis   . Depression   . Dizzy spells   . Headache(784.0)   . PTSD (post-traumatic stress disorder)   . Anxiety   . Sickle cell trait   . Hypertension     Past Surgical History  Procedure Laterality Date  . Dilation and curettage of uterus  2008  . Vaginal delivery      X 1  . Tooth extraction      Family History  Problem Relation Age of Onset  . Anesthesia problems Neg Hx   . Diabetes Maternal Grandmother   . Diabetes Maternal Grandfather   . Diabetes Paternal Grandmother   . Diabetes Paternal Grandfather   . Other Cousin     sickle cell disease  . Arthritis Other   . Asthma Other   . Early death Other     uncle-suicide  . Cataracts Other   . Congestive Heart Failure Other   . Hyperlipidemia Other   . Hypertension Other   . Stroke Other   . Migraines Other   . Diabetes Father   . Hypertension Father     History  Substance Use Topics  . Smoking status: Never Smoker   . Smokeless tobacco: Never Used  . Alcohol Use: Yes     Comment: occas. social  LAST  DRANK IN SEPT-2014    Allergies:  Allergies  Allergen Reactions  . Latex Hives and Rash    Prescriptions prior to admission  Medication Sig Dispense Refill Last Dose  . acetaminophen (TYLENOL) 500 MG tablet Take 2 tablets (1,000 mg total) by mouth every 8 (eight) hours as needed for mild pain or moderate pain. (Patient taking differently: Take  500 mg by mouth every 8 (eight) hours as needed for mild pain or moderate pain. ) 60 tablet 0 08/03/2014 at 1800  . cyclobenzaprine (FLEXERIL) 5 MG tablet Take 1 tablet (5 mg total) by mouth 3 (three) times daily as needed for muscle spasms. 20 tablet 0 Past Month at Unknown time  . Prenat-FeFum-FePo-FA-Omega 3 (CONCEPT DHA) 53.5-38-1 MG CAPS Take 1 tablet by mouth daily. 30 capsule 5 08/03/2014 at Unknown time  . sertraline (ZOLOFT) 100 MG tablet Take 100 mg by mouth daily.   08/03/2014 at 0900  . valACYclovir (VALTREX) 500 MG tablet Take 1 tablet (500 mg total) by mouth 2 (two) times daily. 30 tablet 6 08/03/2014 at 0900  . ketoconazole (NIZORAL) 2 % cream Apply 1 application topically 2 (two) times daily. To affected area. (Patient not taking: Reported on 08/03/2014) 60 g 1 Not Taking at Unknown time    Review of Systems  Constitutional: Negative for fever.  Eyes: Positive for photophobia. Negative for blurred vision.  Respiratory: Negative for shortness of breath.   Cardiovascular: Positive for leg swelling. Negative for chest pain.  Gastrointestinal: Negative for nausea and vomiting.  Genitourinary: Negative for  dysuria.  Neurological: Positive for headaches. Negative for dizziness.  +FM, denies LOF, VB, contractions, vaginal discharge. Also per HPI  Physical Exam   Blood pressure 129/71, pulse 97, temperature 98.1 F (36.7 C), temperature source Oral, resp. rate 18, height 5\' 6"  (1.676 m), weight 244 lb 8 oz (110.904 kg), not currently breastfeeding.  Physical Exam  Constitutional: She is oriented to person, place, and time. She appears well-developed and well-nourished. No distress.  HENT:  Head: Normocephalic and atraumatic.  Eyes: EOM are normal. Pupils are equal, round, and reactive to light.  Cardiovascular: Normal rate, regular rhythm, normal heart sounds and intact distal pulses.   Respiratory: Effort normal and breath sounds normal.  GI: Soft. Bowel sounds are normal. There is  no tenderness.  Musculoskeletal: Normal range of motion. She exhibits no edema or tenderness.  Neurological: She is alert and oriented to person, place, and time. She has normal reflexes.  Skin: Skin is warm and dry.   Dilation: 2 Effacement (%): 50 Cervical Position: Posterior Station: Ballotable Presentation: Vertex Exam by:: Dr. Doroteo GlassmanPhelps Faculty  MAU Course  Procedures - None  MDM: -NST reassuring and reactive - s/p Fioricet x1 >improvement of HA - UA with signs of dehydration and protein  Assessment and Plan  A: Patient is 29 y.o. [id number] 4235w2d reporting headache likely secondary to migraine history. Other symptoms were benign. No swelling appreciated on exam. No signs of preterm labor. BPS remained wnl.   P: Discharge home pt stable - Reviewed findings and my conclusion - fetal kick counts reinforced - preterm labor precautions dicussed - Handout given - Follow-up with OB provider on Thursday  Danielle [name], Danielle [name] 08/04/2014 2:28 AM

## 2014-08-05 ENCOUNTER — Ambulatory Visit (INDEPENDENT_AMBULATORY_CARE_PROVIDER_SITE_OTHER): Payer: Medicaid Other | Admitting: Family Medicine

## 2014-08-05 ENCOUNTER — Other Ambulatory Visit: Payer: Self-pay | Admitting: Obstetrics & Gynecology

## 2014-08-05 VITALS — BP 136/88 | HR 103 | Wt 239.4 lb

## 2014-08-05 DIAGNOSIS — O0992 Supervision of high risk pregnancy, unspecified, second trimester: Secondary | ICD-10-CM

## 2014-08-05 DIAGNOSIS — O10913 Unspecified pre-existing hypertension complicating pregnancy, third trimester: Secondary | ICD-10-CM

## 2014-08-05 DIAGNOSIS — O1413 Severe pre-eclampsia, third trimester: Secondary | ICD-10-CM | POA: Diagnosis not present

## 2014-08-05 DIAGNOSIS — O0993 Supervision of high risk pregnancy, unspecified, third trimester: Secondary | ICD-10-CM

## 2014-08-05 DIAGNOSIS — O119 Pre-existing hypertension with pre-eclampsia, unspecified trimester: Secondary | ICD-10-CM

## 2014-08-05 LAB — POCT URINALYSIS DIP (DEVICE)
Bilirubin Urine: NEGATIVE
Glucose, UA: NEGATIVE mg/dL
HGB URINE DIPSTICK: NEGATIVE
Nitrite: NEGATIVE
Protein, ur: 100 mg/dL — AB
Specific Gravity, Urine: 1.025 (ref 1.005–1.030)
Urobilinogen, UA: 0.2 mg/dL (ref 0.0–1.0)
pH: 6.5 (ref 5.0–8.0)

## 2014-08-05 LAB — OB RESULTS CONSOLE GBS: STREP GROUP B AG: NEGATIVE

## 2014-08-05 NOTE — Progress Notes (Signed)
Subjective:  Danielle [name] is a 29 y.o. [id number] at 3276w4d being seen today for ongoing prenatal care.  Patient reports no complaints.  Contractions: Not present.  Vag. Bleeding: None. Movement: Present. Denies leaking of fluid.   Patient seen in MAU for headache that did not resolve with just tylenol.  She was given fioricet, which helped. She was diagnosed with preeclampsia without severe features on 6/16.  No headache, blurred vision, vision changes, abdominal pain, nausea today.  The following portions of the patient's history were reviewed and updated as appropriate: allergies, current medications, past family history, past medical history, past social history, past surgical history and problem list.   Objective:   Filed Vitals:   08/05/14 1134  BP: 136/88  Pulse: 103  Weight: 239 lb 6.4 oz (108.591 kg)    Fetal Status: Fetal Heart Rate (bpm): NST   Movement: Present     General:  Alert, oriented and cooperative. Patient is in no acute distress.  Skin: Skin is warm and dry. No rash noted.   Cardiovascular: Normal heart rate noted  Respiratory: Normal respiratory effort, no problems with respiration noted  Abdomen: Soft, gravid, appropriate for gestational age. Pain/Pressure: Present     Vaginal: Vag. Bleeding: None.       Cervix: Exam revealed Dilation: 1 Effacement (%): 30 Station: -3  Extremities: Normal range of motion.  Edema: None  Mental Status: Normal mood and affect. Normal behavior. Normal judgment and thought content.   Urinalysis: Urine Protein: 2+ Urine Glucose: Negative  Assessment and Plan:  Pregnancy: [id number] at 4976w4d  1. Chronic hypertension with superimposed preeclampsia NST today - reactive - Fetal nonstress test -weekly CMP until delivery Induction scheduled 7/10 (37 weeks) - would recommend foley balloon with cytotec Severe symptoms discussed and precautions given.  2. Supervision of high-risk pregnancy, second trimester FHT normal.    Preterm labor  symptoms and general obstetric precautions including but not limited to vaginal bleeding, contractions, leaking of fluid and fetal movement were reviewed in detail with the patient.  Please refer to After Visit Summary for other counseling recommendations.   Return for 2x/wk as scheduled.   Levie HeritageJacob J Stinson, DO

## 2014-08-05 NOTE — Progress Notes (Signed)
Pt had MAU visit on 6/28 due to H/A.  IOL scheduled 7/10 @ 0700.

## 2014-08-06 ENCOUNTER — Telehealth (HOSPITAL_COMMUNITY): Payer: Self-pay | Admitting: *Deleted

## 2014-08-06 LAB — GC/CHLAMYDIA PROBE AMP
CT Probe RNA: NEGATIVE
GC Probe RNA: NEGATIVE

## 2014-08-06 NOTE — Telephone Encounter (Signed)
Preadmission screen  

## 2014-08-07 LAB — CULTURE, BETA STREP (GROUP B ONLY)

## 2014-08-10 ENCOUNTER — Other Ambulatory Visit: Payer: Self-pay

## 2014-08-12 ENCOUNTER — Other Ambulatory Visit: Payer: Self-pay

## 2014-08-15 ENCOUNTER — Encounter (HOSPITAL_COMMUNITY): Payer: Self-pay

## 2014-08-15 ENCOUNTER — Inpatient Hospital Stay (HOSPITAL_COMMUNITY)
Admission: RE | Admit: 2014-08-15 | Discharge: 2014-08-17 | DRG: 774 | Disposition: A | Discharge status: Home / Self Care | Payer: Medicaid Other | Source: Ambulatory Visit | Attending: Obstetrics and Gynecology | Admitting: Obstetrics and Gynecology

## 2014-08-15 VITALS — BP 136/82 | HR 96 | Temp 98.2°F | Resp 19 | Ht 67.5 in | Wt 232.0 lb

## 2014-08-15 DIAGNOSIS — Z3A37 37 weeks gestation of pregnancy: Secondary | ICD-10-CM | POA: Diagnosis present

## 2014-08-15 DIAGNOSIS — O9902 Anemia complicating childbirth: Secondary | ICD-10-CM | POA: Diagnosis present

## 2014-08-15 DIAGNOSIS — O1092 Unspecified pre-existing hypertension complicating childbirth: Secondary | ICD-10-CM | POA: Diagnosis present

## 2014-08-15 DIAGNOSIS — O1403 Mild to moderate pre-eclampsia, third trimester: Secondary | ICD-10-CM | POA: Diagnosis not present

## 2014-08-15 DIAGNOSIS — O0992 Supervision of high risk pregnancy, unspecified, second trimester: Secondary | ICD-10-CM

## 2014-08-15 DIAGNOSIS — O113 Pre-existing hypertension with pre-eclampsia, third trimester: Secondary | ICD-10-CM | POA: Diagnosis present

## 2014-08-15 DIAGNOSIS — O119 Pre-existing hypertension with pre-eclampsia, unspecified trimester: Secondary | ICD-10-CM

## 2014-08-15 DIAGNOSIS — Z833 Family history of diabetes mellitus: Secondary | ICD-10-CM | POA: Diagnosis not present

## 2014-08-15 DIAGNOSIS — Z8249 Family history of ischemic heart disease and other diseases of the circulatory system: Secondary | ICD-10-CM

## 2014-08-15 DIAGNOSIS — O10913 Unspecified pre-existing hypertension complicating pregnancy, third trimester: Secondary | ICD-10-CM

## 2014-08-15 DIAGNOSIS — D573 Sickle-cell trait: Secondary | ICD-10-CM | POA: Diagnosis present

## 2014-08-15 LAB — COMPREHENSIVE METABOLIC PANEL
ALT: 12 U/L — ABNORMAL LOW (ref 14–54)
ANION GAP: 10 (ref 5–15)
AST: 21 U/L (ref 15–41)
Albumin: 2.7 g/dL — ABNORMAL LOW (ref 3.5–5.0)
Alkaline Phosphatase: 90 U/L (ref 38–126)
BILIRUBIN TOTAL: 0.3 mg/dL (ref 0.3–1.2)
BUN: 9 mg/dL (ref 6–20)
CO2: 19 mmol/L — AB (ref 22–32)
Calcium: 9.2 mg/dL (ref 8.9–10.3)
Chloride: 110 mmol/L (ref 101–111)
Creatinine, Ser: 0.5 mg/dL (ref 0.44–1.00)
GFR calc non Af Amer: >60 mL/min (ref >60)
GLUCOSE: 107 mg/dL — AB (ref 65–99)
POTASSIUM: 4 mmol/L (ref 3.5–5.1)
Sodium: 139 mmol/L (ref 135–145)
Total Protein: 5.8 g/dL — ABNORMAL LOW (ref 6.5–8.1)

## 2014-08-15 LAB — CBC
HEMATOCRIT: 30.1 % — AB (ref 36.0–46.0)
HEMOGLOBIN: 9.3 g/dL — AB (ref 12.0–15.0)
MCH: 19.8 pg — ABNORMAL LOW (ref 26.0–34.0)
MCHC: 30.9 g/dL (ref 30.0–36.0)
MCV: 64 fL — AB (ref 78.0–100.0)
Platelets: 206 10*3/uL (ref 150–400)
RBC: 4.7 MIL/uL (ref 3.87–5.11)
RDW: 17.7 % — AB (ref 11.5–15.5)
WBC: 5.3 10*3/uL (ref 4.0–10.5)

## 2014-08-15 LAB — HIV ANTIBODY (ROUTINE TESTING W REFLEX): HIV Screen 4th Generation wRfx: NONREACTIVE

## 2014-08-15 LAB — TYPE AND SCREEN
ABO/RH(D): O POS
Antibody Screen: NEGATIVE

## 2014-08-15 LAB — RPR: RPR Ser Ql: NONREACTIVE

## 2014-08-15 LAB — ABO/RH: ABO/RH(D): O POS

## 2014-08-15 MED ORDER — ONDANSETRON HCL 4 MG/2ML IJ SOLN: 4.0000 mg | Freq: Four times a day (QID) | INTRAMUSCULAR | Status: DC | PRN

## 2014-08-15 MED ORDER — LACTATED RINGERS IV SOLN
INTRAVENOUS | Status: DC
  Administered 2014-08-15 (×2): via INTRAVENOUS
  Administered 2014-08-15: 125 mL/h via INTRAVENOUS

## 2014-08-15 MED ORDER — OXYCODONE-ACETAMINOPHEN 5-325 MG PO TABS: 2.0000 | ORAL_TABLET | ORAL | Status: DC | PRN

## 2014-08-15 MED ORDER — LACTATED RINGERS IV SOLN: 500.0000 mL | INTRAVENOUS | Status: DC | PRN

## 2014-08-15 MED ORDER — TERBUTALINE SULFATE 1 MG/ML IJ SOLN: 0.2500 mg | Freq: Once | INTRAMUSCULAR | Status: AC | PRN

## 2014-08-15 MED ORDER — OXYTOCIN 40 UNITS IN LACTATED RINGERS INFUSION - SIMPLE MED
1.0000 m[IU]/min | INTRAVENOUS | Status: DC
  Filled 2014-08-15: qty 1000

## 2014-08-15 MED ORDER — ACETAMINOPHEN 325 MG PO TABS: 650.0000 mg | ORAL_TABLET | ORAL | Status: DC | PRN

## 2014-08-15 MED ORDER — OXYTOCIN BOLUS FROM INFUSION
500.0000 mL | INTRAVENOUS | Status: DC
  Administered 2014-08-16: 500 mL via INTRAVENOUS

## 2014-08-15 MED ORDER — OXYTOCIN 40 UNITS IN LACTATED RINGERS INFUSION - SIMPLE MED
1.0000 m[IU]/min | INTRAVENOUS | Status: DC
Start: 2014-08-15 — End: 2014-08-16
  Administered 2014-08-15: 2 m[IU]/min via INTRAVENOUS

## 2014-08-15 MED ORDER — LIDOCAINE HCL (PF) 1 % IJ SOLN
30.0000 mL | INTRAMUSCULAR | Status: DC | PRN
  Filled 2014-08-15: qty 30

## 2014-08-15 MED ORDER — OXYTOCIN 40 UNITS IN LACTATED RINGERS INFUSION - SIMPLE MED: 62.5000 mL/h | INTRAVENOUS | Status: DC

## 2014-08-15 MED ORDER — OXYCODONE-ACETAMINOPHEN 5-325 MG PO TABS
1.0000 | ORAL_TABLET | ORAL | Status: DC | PRN
Start: 2014-08-15 — End: 2014-08-16

## 2014-08-15 MED ORDER — MISOPROSTOL 200 MCG PO TABS
50.0000 ug | ORAL_TABLET | ORAL | Status: DC | PRN
  Administered 2014-08-15 (×2): 50 ug via ORAL
  Filled 2014-08-15 (×2): qty 0.5

## 2014-08-15 MED ORDER — CITRIC ACID-SODIUM CITRATE 334-500 MG/5ML PO SOLN: 30.0000 mL | ORAL | Status: DC | PRN

## 2014-08-15 NOTE — Progress Notes (Signed)
Danielle [name] is a 29 y.o. [id number] at 3641w0d by ultrasound admitted for induction of labor due to Hypertension and Pre-eclamptic toxemia of pregnancy..  Subjective:  Resting comfortably   Objective: BP 125/58 mmHg  Pulse 89  Temp(Src) 98.5 F (36.9 C) (Oral)  Resp 20  Ht 5' 7.5" (1.715 m)  Wt 232 lb (105.235 kg)  BMI 35.78 kg/m2  LMP  (LMP Unknown)      FHT:  FHR: 150 bpm, variability: moderate,  accelerations:  Present,  decelerations:  Absent UC:   irregular, every 10-15 minutes SVE:   Dilation: 2.5 Effacement (%): 50 Station: -2, -3 Exam by:: Camelia EngK. Haynes, RN  Labs: Lab Results  Component Value Date   WBC 5.3 08/15/2014   HGB 9.3* 08/15/2014   HCT 30.1* 08/15/2014   MCV 64.0* 08/15/2014   PLT 206 08/15/2014    Assessment / Plan: Induction of labor due to preeclampsia,  progressing well on pitocin  Labor: Progressing normally Preeclampsia:  no signs or symptoms of toxicity, intake and ouput balanced and labs stable Fetal Wellbeing:  Category I Pain Control:  Labor support without medications I/D:  n/a Anticipated MOD:  NSVD   Erasmo DownerAngela M Ekta Dancer, MD, MPH PGY-2,  [facility] 08/15/2014 9:56 PM

## 2014-08-15 NOTE — H&P (Signed)
Danielle [name] is a 29 y.o. female [phone] presenting for IOL for CHTN and superimposed pre eclampsia. Maternal Medical History:  Reason for admission: IOL at 37 wks for [facility] and superimposed pre eclampsia    OB History    Gravida Para Term Preterm AB TAB SAB Ectopic Multiple Living   0 1 0 1 0 0 2     Past Medical History  Diagnosis Date  . Endometriosis   . Depression   . Dizzy spells   . Headache(784.0)   . PTSD (post-traumatic stress disorder)   . Anxiety   . Sickle cell trait   . Hypertension    Past Surgical History  Procedure Laterality Date  . Dilation and curettage of uterus  2008  . Vaginal delivery      X 1  . Tooth extraction     Family History: family history includes Arthritis in her other; Asthma in her other; Cataracts in her other; Congestive Heart Failure in her other; Diabetes in her father, maternal grandfather, maternal grandmother, paternal grandfather, and paternal grandmother; Early death in her other; Hyperlipidemia in her other; Hypertension in her father and other; Migraines in her other; Other in her cousin; Stroke in her other. There is no history of Anesthesia problems. Social History:  reports that she has never smoked. She has never used smokeless tobacco. She reports that she drinks alcohol. She reports that she does not use illicit drugs.   Prenatal Transfer Tool  Maternal Diabetes: No Genetic Screening: Normal Maternal Ultrasounds/Referrals: Normal Fetal Ultrasounds or other Referrals:  None Maternal Substance Abuse:  No Significant Maternal Medications:  None Significant Maternal Lab Results:  None Other Comments:  None  Review of Systems  Constitutional: Negative.   HENT: Negative.   Eyes: Negative.   Respiratory: Negative.   Cardiovascular: Negative.   Gastrointestinal: Negative.   Genitourinary: Negative.   Musculoskeletal: Negative.   Skin: Negative.   Neurological: Negative.   Endo/Heme/Allergies: Negative.    Psychiatric/Behavioral: Negative.     Dilation: 2 Effacement (%): 50 Station: -3 Exam by:: S Nix RN Blood pressure 130/98, pulse 126, temperature 98.5 F (36.9 C), temperature source Oral, resp. rate 18, height 5' 7.5" (1.715 m), weight 232 lb (105.235 kg), not currently breastfeeding. Maternal Exam:  Uterine Assessment: No contractions  Abdomen: Fetal presentation: vertex  Introitus: Normal vulva. Normal vagina.  Amniotic fluid character: not assessed.  Pelvis: adequate for delivery.   Cervix: Cervix evaluated by digital exam.     Fetal Exam Fetal Monitor Review: Mode: ultrasound.   Variability: moderate (6-25 bpm).   Pattern: accelerations present.    Fetal State Assessment: Category I - tracings are normal.     Physical Exam  Constitutional: She is oriented to person, place, and time. She appears well-developed and well-nourished.  HENT:  Head: Normocephalic.  Eyes: Pupils are equal, round, and reactive to light.  Neck: Normal range of motion. Neck supple.  Cardiovascular: Normal rate, regular rhythm, normal heart sounds and intact distal pulses.   Respiratory: Effort normal and breath sounds normal.  GI: Soft. Bowel sounds are normal.  Genitourinary: Vagina normal and uterus normal.  Musculoskeletal: Normal range of motion.  Neurological: She is alert and oriented to person, place, and time. She has normal reflexes.  Skin: Skin is warm and dry.  Psychiatric: She has a normal mood and affect. Her behavior is normal. Judgment and thought content normal.    Prenatal labs: ABO, Rh: --/--/O POS (07/10 0815) Antibody: NEG (07/10  [id number]) Rubella: 1.08 (01/14 1212) RPR: NON REAC (05/02 1249)  HBsAg: NEGATIVE (01/14 1212)  HIV: NONREACTIVE (05/02 1249)  GBS:     Assessment/Plan: IOL for CHTN and pre eclampsia   LAWSON, MARIE DARLENE 08/15/2014, 9:26 AM

## 2014-08-15 NOTE — Progress Notes (Signed)
Dorna Maiori Bodnar is a 29 y.o. [id number] at [redacted]w[redacted]d by ultrasound admitted for induction of labor due to Hypertension and Pre-eclamptic toxemia of pregnancy..  Subjective:   Objective: BP 130/98 mmHg  Pulse 126  Temp(Src) 98.5 F (36.9 C) (Oral)  Resp 18  Ht 5' 7.5" (1.715 m)  Wt 232 lb (105.235 kg)  BMI 35.78 kg/m2  LMP  (LMP Unknown)      FHT:  FHR: 135-140 bpm, variability: moderate,  accelerations:  Present,  decelerations:  Absent UC:   regular, every 5 minutes SVE:   Dilation: 2 Effacement (%): 50 Station: -3 Exam by:: S Nix RN  Labs: Lab Results  Component Value Date   WBC 5.3 08/15/2014   HGB 9.3* 08/15/2014   HCT 30.1* 08/15/2014   MCV 64.0* 08/15/2014   PLT 206 08/15/2014    Assessment / Plan: Induction of labor due to preeclampsia and materal medical conditions,  progressing well on pitocin  Labor: Progressing normally Preeclampsia:  no signs or symptoms of toxicity Fetal Wellbeing:  Category I Pain Control:  Labor support without medications I/D:  n/a Anticipated MOD:  NSVD  LAWSON, MARIE DARLENE 08/15/2014, 1:22 PM

## 2014-08-15 NOTE — Progress Notes (Signed)
Spoke with D.Lawson,CNM about plan of care; plan to continue pitocin at this time, check cervix at midnight, if no cervical change, then d/c pitocin and give cytotec po q 4hr oral

## 2014-08-15 NOTE — Progress Notes (Signed)
Danielle [name] is a 29 y.o. [id number] at 1239w0d by ultrasound admitted for induction of labor due to Hypertension and Pre-eclamptic toxemia of pregnancy..  Subjective:   Objective: BP 135/65 mmHg  Pulse 95  Temp(Src) 98.4 F (36.9 C) (Oral)  Resp 18  Ht 5' 7.5" (1.715 m)  Wt 232 lb (105.235 kg)  BMI 35.78 kg/m2  LMP  (LMP Unknown)      FHT:  FHR: 150 bpm, variability: moderate,  accelerations:  Present,  decelerations:  Absent UC:   irregular, every 4-10 minutes SVE:   Dilation: 2.5 Effacement (%): 50 Station: -2, -3 Exam by:: Camelia EngK. Haynes, RN  Labs: Lab Results  Component Value Date   WBC 5.3 08/15/2014   HGB 9.3* 08/15/2014   HCT 30.1* 08/15/2014   MCV 64.0* 08/15/2014   PLT 206 08/15/2014    Assessment / Plan: Induction of labor due to preeclampsia,  progressing well on pitocin  Labor: Progressing normally Preeclampsia:  no signs or symptoms of toxicity, intake and ouput balanced and labs stable Fetal Wellbeing:  Category I Pain Control:  Labor support without medications I/D:  n/a Anticipated MOD:  NSVD  Danielle [name], Danielle [name] 08/15/2014, 5:46 PM

## 2014-08-16 ENCOUNTER — Encounter (HOSPITAL_COMMUNITY): Payer: Self-pay

## 2014-08-16 DIAGNOSIS — O1403 Mild to moderate pre-eclampsia, third trimester: Secondary | ICD-10-CM

## 2014-08-16 DIAGNOSIS — Z3A37 37 weeks gestation of pregnancy: Secondary | ICD-10-CM

## 2014-08-16 MED ORDER — WITCH HAZEL-GLYCERIN EX PADS: 1.0000 "application " | MEDICATED_PAD | CUTANEOUS | Status: DC | PRN

## 2014-08-16 MED ORDER — OXYCODONE-ACETAMINOPHEN 5-325 MG PO TABS
2.0000 | ORAL_TABLET | ORAL | Status: DC | PRN
  Administered 2014-08-17: 2 via ORAL
  Filled 2014-08-16: qty 2

## 2014-08-16 MED ORDER — BUTORPHANOL TARTRATE 1 MG/ML IJ SOLN
INTRAMUSCULAR | Status: AC
  Administered 2014-08-16: 2 mg via INTRAVENOUS
  Filled 2014-08-16: qty 2

## 2014-08-16 MED ORDER — TETANUS-DIPHTH-ACELL PERTUSSIS 5-2.5-18.5 LF-MCG/0.5 IM SUSP
0.5000 mL | Freq: Once | INTRAMUSCULAR | Status: AC
  Administered 2014-08-17: 0.5 mL via INTRAMUSCULAR

## 2014-08-16 MED ORDER — DIBUCAINE 1 % RE OINT: 1.0000 "application " | TOPICAL_OINTMENT | RECTAL | Status: DC | PRN

## 2014-08-16 MED ORDER — PRENATAL MULTIVITAMIN CH
1.0000 | ORAL_TABLET | Freq: Every day | ORAL | Status: DC
  Administered 2014-08-16: 1 via ORAL
  Filled 2014-08-16 (×2): qty 1

## 2014-08-16 MED ORDER — OXYCODONE-ACETAMINOPHEN 5-325 MG PO TABS
1.0000 | ORAL_TABLET | ORAL | Status: DC | PRN
  Administered 2014-08-16 – 2014-08-17 (×3): 1 via ORAL
  Filled 2014-08-16 (×3): qty 1

## 2014-08-16 MED ORDER — BUTORPHANOL TARTRATE 1 MG/ML IJ SOLN
2.0000 mg | Freq: Once | INTRAMUSCULAR | Status: AC
  Administered 2014-08-16: 2 mg via INTRAVENOUS

## 2014-08-16 MED ORDER — BENZOCAINE-MENTHOL 20-0.5 % EX AERO
1.0000 "application " | INHALATION_SPRAY | CUTANEOUS | Status: DC | PRN
  Administered 2014-08-16: 1 via TOPICAL
  Filled 2014-08-16: qty 56

## 2014-08-16 MED ORDER — LANOLIN HYDROUS EX OINT: TOPICAL_OINTMENT | CUTANEOUS | Status: DC | PRN

## 2014-08-16 MED ORDER — ONDANSETRON HCL 4 MG PO TABS
4.0000 mg | ORAL_TABLET | ORAL | Status: DC | PRN
  Administered 2014-08-16: 4 mg via ORAL
  Filled 2014-08-16: qty 1

## 2014-08-16 MED ORDER — ACETAMINOPHEN 325 MG PO TABS: 650.0000 mg | ORAL_TABLET | ORAL | Status: DC | PRN

## 2014-08-16 MED ORDER — ZOLPIDEM TARTRATE 5 MG PO TABS: 5.0000 mg | ORAL_TABLET | Freq: Every evening | ORAL | Status: DC | PRN

## 2014-08-16 MED ORDER — SENNOSIDES-DOCUSATE SODIUM 8.6-50 MG PO TABS
2.0000 | ORAL_TABLET | ORAL | Status: DC
  Administered 2014-08-16: 2 via ORAL
  Filled 2014-08-16: qty 2

## 2014-08-16 MED ORDER — IBUPROFEN 600 MG PO TABS
600.0000 mg | ORAL_TABLET | Freq: Four times a day (QID) | ORAL | Status: DC
  Administered 2014-08-16 – 2014-08-17 (×5): 600 mg via ORAL
  Filled 2014-08-16 (×6): qty 1

## 2014-08-16 MED ORDER — SIMETHICONE 80 MG PO CHEW: 80.0000 mg | CHEWABLE_TABLET | ORAL | Status: DC | PRN

## 2014-08-16 MED ORDER — DIPHENHYDRAMINE HCL 25 MG PO CAPS: 25.0000 mg | ORAL_CAPSULE | Freq: Four times a day (QID) | ORAL | Status: DC | PRN

## 2014-08-16 MED ORDER — SERTRALINE HCL 100 MG PO TABS
100.0000 mg | ORAL_TABLET | Freq: Every day | ORAL | Status: DC
  Administered 2014-08-16 – 2014-08-17 (×2): 100 mg via ORAL
  Filled 2014-08-16 (×3): qty 1

## 2014-08-16 MED ORDER — ONDANSETRON HCL 4 MG/2ML IJ SOLN: 4.0000 mg | INTRAMUSCULAR | Status: DC | PRN
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
## 2014-08-16 NOTE — Lactation Note (Signed)
This note was copied from the chart of Danielle [name]. Lactation Consultation Note; Mother states that breastfeeding is going well. She denies having any concerns. Mother is experienced with 2 other children having breastfed one for 3 months and the other for 12 months. Mother was given Nurse, learning disabilityLactation Brochure and reviewed basic teaching. Infant is receiving a bath at this time. Advised [facility] to [facility] with any concerns or difficulty latching infant . Mother receptive to all teaching.   Patient Name: Danielle Dorna Maiori Salomone JXBJY'NToday's Date: 08/16/2014     Maternal Data    Feeding Feeding Type: Breast Fed Length of feed: 20 min  LATCH Score/Interventions                      Lactation Tools Discussed/Used     Consult Status      Michel BickersKendrick, Rembert Browe McCoy 08/16/2014, 3:42 PM

## 2014-08-16 NOTE — Progress Notes (Signed)
This note also relates to the following rows which could not be included: Dose (milli-units/min) Oxytocin - Cannot attach notes to extension rows Rate (mL/hr) Oxytocin - Cannot attach notes to extension rows Concentration Oxytocin - Cannot attach notes to extension rows   Pt requests that the pitocin be turned of now

## 2014-08-17 MED ORDER — IBUPROFEN 600 MG PO TABS: 600.0000 mg | ORAL_TABLET | Freq: Four times a day (QID) | ORAL | Status: DC | PRN

## 2014-08-17 NOTE — Lactation Note (Signed)
This note was copied from the chart of Danielle Korinna Grattan. Lactation Consultation Note: observed that mothers breast are filling. Assist with infant to right breast. Infant sustained latch with good depth. Observed milk transfer. Mother advised in breast compression. Reviewed treatment to prevent engorgement. Mother receptive to all teaching. She is aware of available LC services and community support.   Patient Name: Danielle [name] WUJWJ'XToday's Date: 08/17/2014 Reason for consult: Follow-up assessment   Maternal Data    Feeding Feeding Type: Breast Fed Length of feed: 10 min  LATCH Score/Interventions Latch: Grasps breast easily, tongue down, lips flanged, rhythmical sucking.  Audible Swallowing: Spontaneous and intermittent Intervention(s): Hand expression  Type of Nipple: Everted at rest and after stimulation  Comfort (Breast/Nipple): Filling, red/small blisters or bruises, mild/mod discomfort  Problem noted: Filling Interventions (Filling): Hand pump  Hold (Positioning): Assistance needed to correctly position infant at breast and maintain latch. Intervention(s): Support Pillows;Position options  LATCH Score: 8  Lactation Tools Discussed/Used     Consult Status Consult Status: Complete    Danielle [name], Danielle [name] 08/17/2014, 11:12 AM

## 2014-08-17 NOTE — Discharge Summary (Signed)
Obstetric Discharge Summary Reason for Admission: induction of labor Prenatal Procedures: none Intrapartum Procedures: spontaneous vaginal delivery Postpartum Procedures: none Complications-Operative and Postpartum: none  Delivery Note At 4:23 AM a viable female was delivered via (Presentation: vertex, OA). APGAR: 8, 9; weight pending.  Placenta status: spontaneous, intact. Cord: with the following complications: none . Cord pH: n/a  Hospital Course:  Active Problems:   Chronic hypertension with superimposed preeclampsia   Danielle [name] is a 29 y.o. [id number] s/p IOL 2/2 .  Patient was admitted cHTN and superimposed preeclampsia.  She has postpartum course that was uncomplicated including no problems with ambulating, PO intake, urination, pain, or bleeding. The pt feels ready to go home and  will be discharged with outpatient follow-up.   Today: No acute events overnight.  Pt denies problems with ambulating, voiding or po intake.  She denies nausea or vomiting.  Pain is moderately controlled.  She has had flatus. She has not had bowel movement.  Lochia Small.  Plan for birth control is  unknown.  Method of Feeding: Breast  Physical Exam:  General: alert and cooperative Lochia: appropriate Uterine Fundus: firm DVT Evaluation: No evidence of DVT seen on physical exam.  H/H: Lab Results  Component Value Date/Time   HGB 9.3* 08/15/2014 08:15 AM   HCT 30.1* 08/15/2014 08:15 AM    Discharge Diagnoses: Term Pregnancy-delivered  Discharge Information: Date: 08/17/2014 Activity: pelvic rest Diet: routine  Medications: PNV and Ibuprofen Breast feeding:  Yes Condition: stable Instructions: refer to handout Discharge to: home      Medication List    TAKE these medications        acetaminophen 500 MG tablet  Commonly known as:  TYLENOL  Take 2 tablets (1,000 mg total) by mouth every 8 (eight) hours as needed for mild pain or moderate pain.     CONCEPT DHA 53.5-38-1 MG  Caps  Take 1 tablet by mouth daily.     cyclobenzaprine 5 MG tablet  Commonly known as:  FLEXERIL  Take 1 tablet (5 mg total) by mouth 3 (three) times daily as needed for muscle spasms.     ibuprofen 600 MG tablet  Commonly known as:  ADVIL,MOTRIN  Take 1 tablet (600 mg total) by mouth every 6 (six) hours as needed for mild pain, moderate pain or cramping.     sertraline 100 MG tablet  Commonly known as:  ZOLOFT  Take 100 mg by mouth daily.     valACYclovir 500 MG tablet  Commonly known as:  VALTREX  Take 1 tablet (500 mg total) by mouth 2 (two) times daily.           Follow-up Information    Follow up with [facility] In 6 weeks.   Specialty:  Obstetrics and Gynecology   Why:  For postpartum visit   Contact information:   [address] [place] [address] [phone]      De Hollingshead ,MD OB Fellow 08/17/2014,9:13 AM  OB fellow attestation I have seen and examined this patient and agree with above documentation in the resident's note.   Danielle [name] is a 29 y.o. [id number] s/p SVD.   Pain is well controlled.  Plan for birth control is Undecided at the time of discharge.  Method of Feeding: breast  PE:  BP 136/82 mmHg  Pulse 96  Temp(Src) 98.2 F (36.8 C) (Oral)  Resp 19  Ht 5' 7.5" (1.715 m)  Wt 232 lb (105.235 kg)  BMI 35.78  kg/m2  SpO2 99%  LMP  (LMP Unknown)  Breastfeeding? Unknown Fundus firm   Recent Labs  08/15/14 0815  HGB 9.3*  HCT 30.1*   Plan: discharge today - postpartum care discussed - BP has returned to normal range. Will need BP check and plan for [facility] RN to check - f/u clinic in 6 weeks for postpartum visit   Federico FlakeKimberly Niles Adelei Scobey, MD 10:02 PM

## 2014-08-17 NOTE — Discharge Instructions (Signed)

## 2014-08-19 ENCOUNTER — Telehealth: Payer: Self-pay | Admitting: *Deleted

## 2014-08-19 NOTE — Telephone Encounter (Signed)
Patient calling to see if she can get something for constipation, would like rx for stool softener. Also wondering if there is anything she can take for pain besides the ibuprofen.

## 2014-08-19 NOTE — Telephone Encounter (Addendum)
Called patient back and left message that we are returning your call.   7/15  1045  Called pt and left message that I am returning her call to discuss her concerns/questions. Please call back and leave message if detailed information can be left on her voice mail.  **Rx for Colace sent to pharmacy. She may need Miralax as well.  Diane Day RNC

## 2014-08-20 MED ORDER — DOCUSATE SODIUM 100 MG PO CAPS: 100.0000 mg | ORAL_CAPSULE | Freq: Two times a day (BID) | ORAL | Status: DC

## 2014-08-27 ENCOUNTER — Other Ambulatory Visit: Payer: Self-pay | Admitting: General Practice

## 2014-08-27 DIAGNOSIS — I10 Essential (primary) hypertension: Secondary | ICD-10-CM

## 2014-08-27 MED ORDER — LABETALOL HCL 200 MG PO TABS: 200.0000 mg | ORAL_TABLET | Freq: Two times a day (BID) | ORAL | Status: DC

## 2014-08-27 NOTE — Progress Notes (Signed)
Melissa from [facility] called stating she spoke with Dr Debroah Loop who stated patient needed to restart labetalol due to high blood pressures at [facility] home visit. Melissa states the patient is out of labetalol and needs a refill. Spoke to Dr Macon Large who ordered medication. Informed Melissa that medication will be sent to pharmacy.

## 2014-09-06 ENCOUNTER — Emergency Department (HOSPITAL_COMMUNITY)
Admission: EM | Admit: 2014-09-06 | Discharge: 2014-09-06 | Discharge status: Against Medical Advice | Payer: Medicaid Other

## 2014-09-06 ENCOUNTER — Encounter (HOSPITAL_COMMUNITY): Payer: Self-pay | Admitting: Emergency Medicine

## 2014-09-06 ENCOUNTER — Emergency Department (HOSPITAL_COMMUNITY)
Admission: EM | Admit: 2014-09-06 | Discharge: 2014-09-07 | Disposition: A | Discharge status: Home / Self Care | Payer: Medicaid Other | Attending: Emergency Medicine | Admitting: Emergency Medicine

## 2014-09-06 DIAGNOSIS — I10 Essential (primary) hypertension: Secondary | ICD-10-CM | POA: Diagnosis not present

## 2014-09-06 DIAGNOSIS — Z8742 Personal history of other diseases of the female genital tract: Secondary | ICD-10-CM | POA: Diagnosis not present

## 2014-09-06 DIAGNOSIS — Z91040 Latex allergy status: Secondary | ICD-10-CM | POA: Diagnosis not present

## 2014-09-06 DIAGNOSIS — Z79899 Other long term (current) drug therapy: Secondary | ICD-10-CM | POA: Insufficient documentation

## 2014-09-06 DIAGNOSIS — F329 Major depressive disorder, single episode, unspecified: Secondary | ICD-10-CM | POA: Diagnosis not present

## 2014-09-06 DIAGNOSIS — F419 Anxiety disorder, unspecified: Secondary | ICD-10-CM | POA: Insufficient documentation

## 2014-09-06 DIAGNOSIS — G43009 Migraine without aura, not intractable, without status migrainosus: Secondary | ICD-10-CM

## 2014-09-06 DIAGNOSIS — D573 Sickle-cell trait: Secondary | ICD-10-CM | POA: Diagnosis not present

## 2014-09-06 DIAGNOSIS — R51 Headache: Secondary | ICD-10-CM | POA: Diagnosis present

## 2014-09-06 MED ORDER — KETOROLAC TROMETHAMINE 30 MG/ML IJ SOLN
30.0000 mg | Freq: Once | INTRAMUSCULAR | Status: AC
  Administered 2014-09-06: 30 mg via INTRAVENOUS
  Filled 2014-09-06: qty 1

## 2014-09-06 MED ORDER — DIPHENHYDRAMINE HCL 50 MG/ML IJ SOLN
25.0000 mg | Freq: Once | INTRAMUSCULAR | Status: AC
  Administered 2014-09-06: 25 mg via INTRAVENOUS
  Filled 2014-09-06: qty 1

## 2014-09-06 MED ORDER — DEXAMETHASONE SODIUM PHOSPHATE 10 MG/ML IJ SOLN
10.0000 mg | Freq: Once | INTRAMUSCULAR | Status: AC
  Administered 2014-09-06: 10 mg via INTRAVENOUS
  Filled 2014-09-06: qty 1

## 2014-09-06 MED ORDER — SODIUM CHLORIDE 0.9 % IV BOLUS (SEPSIS)
1000.0000 mL | Freq: Once | INTRAVENOUS | Status: AC
  Administered 2014-09-06: 1000 mL via INTRAVENOUS

## 2014-09-06 MED ORDER — METOCLOPRAMIDE HCL 5 MG/ML IJ SOLN
10.0000 mg | Freq: Once | INTRAMUSCULAR | Status: AC
  Administered 2014-09-06: 10 mg via INTRAVENOUS
  Filled 2014-09-06: qty 2

## 2014-09-06 NOTE — ED Notes (Signed)
Pt. reports migraine headache with photophobia onset today unrelieved by OTC Ibuprofen , denies emesis , no head injury.

## 2014-09-06 NOTE — Discharge Instructions (Signed)
Recurrent Migraine Headache °A migraine headache is very bad, throbbing pain on one or both sides of your head. Recurrent migraines keep coming back. Talk to your doctor about what things may bring on (trigger) your migraine headaches. °HOME CARE °· Only take medicines as told by your doctor. °· Lie down in a dark, quiet room when you have a migraine. °· Keep a journal to find out if certain things bring on migraine headaches. For example, write down: °¨ What you eat and drink. °¨ How much sleep you get. °¨ Any change to your diet or medicines. °· Lessen how much alcohol you drink. °· Quit smoking if you smoke. °· Get enough sleep. °· Lessen any stress in your life. °· Keep lights dim if bright lights bother you or make your migraines worse. °GET HELP IF: °· Medicine does not help your migraines. °· Your pain keeps coming back. °· You have a fever. °GET HELP RIGHT AWAY IF:  °· Your migraine becomes really bad. °· You have a stiff neck. °· You have trouble seeing. °· Your muscles are weak, or you lose muscle control. °· You lose your balance or have trouble walking. °· You feel like you will pass out (faint), or you pass out. °· You have really bad symptoms that are different than your first symptoms. °MAKE SURE YOU:  °· Understand these instructions. °· Will watch your condition. °· Will get help right away if you are not doing well or get worse. °Document Released: 11/01/2007 Document Revised: 01/27/2013 Document Reviewed: 09/29/2012 °ExitCare® Patient Information ©2015 [employer]. This information is not intended to replace advice given to you by your health care provider. Make sure you discuss any questions you have with your health care provider. ° °

## 2014-09-06 NOTE — ED Provider Notes (Signed)
CSN: [id number]     Arrival date & time 09/06/14  2059 History   First MD Initiated Contact with Patient 09/06/14 2159     Chief Complaint  Patient presents with  . Migraine     (Consider location/radiation/quality/duration/timing/severity/associated sxs/prior Treatment) Patient is a 29 y.o. female presenting with migraines.  Migraine This is a new problem. The current episode started 6 to 12 hours ago. The problem occurs constantly. Progression since onset: waxing and waning intensity. Associated symptoms include headaches. Nothing aggravates the symptoms. Nothing relieves the symptoms. Treatments tried: NSAIDs. The treatment provided no relief.    Past Medical History  Diagnosis Date  . Endometriosis   . Depression   . Dizzy spells   . Headache(784.0)   . PTSD (post-traumatic stress disorder)   . Anxiety   . Sickle cell trait   . Hypertension    Past Surgical History  Procedure Laterality Date  . Dilation and curettage of uterus  2008  . Vaginal delivery      X 1  . Tooth extraction     Family History  Problem Relation Age of Onset  . Anesthesia problems Neg Hx   . Diabetes Maternal Grandmother   . Diabetes Maternal Grandfather   . Diabetes Paternal Grandmother   . Diabetes Paternal Grandfather   . Other Cousin     sickle cell disease  . Arthritis Other   . Asthma Other   . Early death Other     uncle-suicide  . Cataracts Other   . Congestive Heart Failure Other   . Hyperlipidemia Other   . Hypertension Other   . Stroke Other   . Migraines Other   . Diabetes Father   . Hypertension Father    History  Substance Use Topics  . Smoking status: Never Smoker   . Smokeless tobacco: Never Used  . Alcohol Use: Yes     Comment: occas. social  LAST  DRANK IN SEPT-2014   OB History    Gravida Para Term Preterm AB TAB SAB Ectopic Multiple Living   4 3 3  0 1 0 1 0 0 3     Review of Systems  Neurological: Positive for headaches.  All other systems reviewed and  are negative.     Allergies  Latex  Home Medications   Prior to Admission medications   Medication Sig Start Date End Date Taking? Authorizing Provider  labetalol (NORMODYNE) 200 MG tablet Take 1 tablet (200 mg total) by mouth 2 (two) times daily. 08/27/14  Yes Tereso Newcomer, MD  sertraline (ZOLOFT) 100 MG tablet Take 100 mg by mouth daily.   Yes Historical Provider, MD  acetaminophen (TYLENOL) 500 MG tablet Take 2 tablets (1,000 mg total) by mouth every 8 (eight) hours as needed for mild pain or moderate pain. Patient not taking: Reported on 08/15/2014 07/23/14   Fredirick Lathe, MD  cyclobenzaprine (FLEXERIL) 5 MG tablet Take 1 tablet (5 mg total) by mouth 3 (three) times daily as needed for muscle spasms. Patient not taking: Reported on 08/15/2014 06/22/14   Pincus Large, DO  docusate sodium (COLACE) 100 MG capsule Take 1 capsule (100 mg total) by mouth 2 (two) times daily. Patient not taking: Reported on 09/06/2014 08/20/14   Rhona Raider Stinson, DO  ibuprofen (ADVIL,MOTRIN) 600 MG tablet Take 1 tablet (600 mg total) by mouth every 6 (six) hours as needed for mild pain, moderate pain or cramping. Patient not taking: Reported on 09/06/2014 08/17/14   Arvilla Market,  DO  Prenat-FeFum-FePo-FA-Omega 3 (CONCEPT DHA) 53.5-38-1 MG CAPS Take 1 tablet by mouth daily. Patient not taking: Reported on 09/06/2014 07/22/14   Jacklyn Shell, CNM  valACYclovir (VALTREX) 500 MG tablet Take 1 tablet (500 mg total) by mouth 2 (two) times daily. Patient not taking: Reported on 08/15/2014 07/22/14   Vela Prose A Anyanwu, MD   BP 146/83 mmHg  Pulse 59  Temp(Src) 98.5 F (36.9 C) (Oral)  Resp 14  SpO2 100%  LMP  Physical Exam  Constitutional: She is oriented to person, place, and time. She appears well-developed and well-nourished. No distress.  HENT:  Head: Normocephalic.  Eyes: Conjunctivae are normal.  Neck: Neck supple. No tracheal deviation present.  Cardiovascular: Normal rate and regular  rhythm.   Pulmonary/Chest: Effort normal. No respiratory distress.  Abdominal: Soft. She exhibits no distension.  Neurological: She is alert and oriented to person, place, and time. She has normal strength. No cranial nerve deficit. Coordination normal. GCS eye subscore is 4. GCS verbal subscore is 5. GCS motor subscore is 6.  Skin: Skin is warm and dry.  Psychiatric: She has a normal mood and affect.    ED Course  Procedures (including critical care time) Labs Review Labs Reviewed - No data to display  Imaging Review No results found.   EKG Interpretation None      MDM   Final diagnoses:  Migraine without aura and without status migrainosus, not intractable    29 year old female presents with typical migraine symptoms. She started having pain earlier today and it has gotten slightly better with NSAIDs but is still present. She is otherwise well-appearing, has no neurologic deficits, and is otherwise low risk for headache. Treated with supportive care for migraine. Improved and wishing to go home.  Lyndal Pulley, MD 09/06/14 [phone]

## 2014-09-15 ENCOUNTER — Telehealth: Payer: Self-pay | Admitting: General Practice

## 2014-09-15 DIAGNOSIS — F329 Major depressive disorder, single episode, unspecified: Secondary | ICD-10-CM

## 2014-09-15 DIAGNOSIS — F32A Depression, unspecified: Secondary | ICD-10-CM

## 2014-09-15 MED ORDER — SERTRALINE HCL 100 MG PO TABS: 100.0000 mg | ORAL_TABLET | Freq: Every day | ORAL | Status: DC

## 2014-09-15 NOTE — Telephone Encounter (Signed)
Patient requested refill on zoloft through fax from pharmacy. Wynelle Bourgeois authorized refill. Called patient, no answer- left message stating we are calling to let you know we received your refill request and have sent that to your pharmacy. Will send mychart message

## 2014-09-24 ENCOUNTER — Ambulatory Visit: Payer: Self-pay | Admitting: Family Medicine

## 2014-11-06 ENCOUNTER — Other Ambulatory Visit: Payer: Self-pay | Admitting: Advanced Practice Midwife

## 2014-11-06 ENCOUNTER — Other Ambulatory Visit: Payer: Self-pay | Admitting: Obstetrics and Gynecology

## 2014-11-06 ENCOUNTER — Other Ambulatory Visit: Payer: Self-pay | Admitting: Internal Medicine

## 2014-11-10 ENCOUNTER — Telehealth: Payer: Self-pay | Admitting: General Practice

## 2014-11-10 DIAGNOSIS — F329 Major depressive disorder, single episode, unspecified: Secondary | ICD-10-CM

## 2014-11-10 DIAGNOSIS — F32A Depression, unspecified: Secondary | ICD-10-CM

## 2014-11-10 MED ORDER — SERTRALINE HCL 100 MG PO TABS: 100.0000 mg | ORAL_TABLET | Freq: Every day | ORAL | Status: DC

## 2014-11-10 NOTE — Telephone Encounter (Signed)
Received mychart message for refill on zoloft. Per chart review, patient never showed to pp visit. Per Dr Ashok Pall, can send one refill in for patient but patient needs to establish care with pcp. Called patient and message states this number has been disconnected. Will send mychart message

## 2014-11-29 ENCOUNTER — Encounter: Payer: Self-pay | Admitting: Obstetrics and Gynecology

## 2014-11-29 ENCOUNTER — Ambulatory Visit (INDEPENDENT_AMBULATORY_CARE_PROVIDER_SITE_OTHER): Payer: Medicaid Other | Admitting: Obstetrics and Gynecology

## 2014-11-29 VITALS — BP 117/71 | HR 99 | Ht 68.0 in | Wt 236.3 lb

## 2014-11-29 DIAGNOSIS — M546 Pain in thoracic spine: Secondary | ICD-10-CM

## 2014-11-29 MED ORDER — CYCLOBENZAPRINE HCL 5 MG PO TABS: 5.0000 mg | ORAL_TABLET | Freq: Three times a day (TID) | ORAL | Status: DC | PRN

## 2014-11-29 MED ORDER — IBUPROFEN 600 MG PO TABS: 600.0000 mg | ORAL_TABLET | Freq: Four times a day (QID) | ORAL | Status: DC | PRN

## 2014-11-29 NOTE — Progress Notes (Signed)
   Subjective:    Patient ID: Danielle [name], female    DOB: Jun 20, 1985, 29 y.o.   MRN: [id number]  HPI  29 yo [id number] s/p SVD in July presenting today for evaluation of back pain following a fall which occurred 3 weeks ago. She never came for her postpartum visit. Patient reports doing well at home. She is breastfeeding exclusively. She is not currently sexually active and is not planning on using any method of contraception right now. She reports falling on her back secondary to slipping in a puddle of water and landing on her cabinet. She has been taking ibuprofen and flexeril which helped with her pain. She did not see a PCP or ED provider for that pain prior to this visit. She states that the pain is throbbing and she finds it difficult to sit without dorsal support for prolonged periods of time. Patient is otherwise without complaints.  Past Medical History  Diagnosis Date  . Endometriosis   . Depression   . Dizzy spells   . Headache(784.0)   . PTSD (post-traumatic stress disorder)   . Anxiety   . Sickle cell trait (HCC)   . Hypertension    Past Surgical History  Procedure Laterality Date  . Dilation and curettage of uterus  2008  . Vaginal delivery      X 1  . Tooth extraction     Family History  Problem Relation Age of Onset  . Anesthesia problems Neg Hx   . Diabetes Maternal Grandmother   . Diabetes Maternal Grandfather   . Diabetes Paternal Grandmother   . Diabetes Paternal Grandfather   . Other Cousin     sickle cell disease  . Arthritis Other   . Asthma Other   . Early death Other     uncle-suicide  . Cataracts Other   . Congestive Heart Failure Other   . Hyperlipidemia Other   . Hypertension Other   . Stroke Other   . Migraines Other   . Diabetes Father   . Hypertension Father    Social History  Substance Use Topics  . Smoking status: Never Smoker   . Smokeless tobacco: Never Used  . Alcohol Use: Yes     Comment: occas. social  LAST  DRANK IN SEPT-2014        Review of Systems See pertinent in HPI    Objective:   Physical Exam  Blood pressure 117/71, pulse 99, height 5\' 8"  (1.727 m), weight 236 lb 4.8 oz (107.185 kg), unknown if currently breastfeeding. GENERAL: Well-developed, well-nourished female in no acute distress.  BACK: no pain over spine. Pain reproducible with palpation over left flank area EXTREMITIES: No cyanosis, clubbing, or edema, 2+ distal pulses.       Assessment & Plan:  29 yo with back pain s/p fall - RX ibuprofen and flexeril provided - recommended some stretching exercises as well - Follow up with PCP if pain does not improve over time

## 2014-12-09 ENCOUNTER — Encounter: Payer: Self-pay | Admitting: *Deleted

## 2015-01-04 ENCOUNTER — Encounter: Payer: Self-pay | Admitting: Family Medicine

## 2015-01-04 ENCOUNTER — Telehealth: Payer: Self-pay | Admitting: *Deleted

## 2015-01-04 NOTE — Telephone Encounter (Signed)
Pt called requesting a prescription for Reglan.

## 2015-01-04 NOTE — Telephone Encounter (Signed)
Returned pt's call and heard message stating that the number dialed was no longer in service. [phone]).  Pt also left message via My Chart requesting Reglan and stated that her milk is quickly drying up. She does not want to supplement. I sent message back to pt stating that we would like to talk with her prior to sending Rx - need to know if pt is pumping, how often and if she has talked with a Advertising copywriterLactation Consultant. I requested another number where she can be reached.

## 2015-01-05 ENCOUNTER — Telehealth: Payer: Self-pay | Admitting: General Practice

## 2015-01-05 NOTE — Telephone Encounter (Signed)
Called patient regarding her mychart message and asked if she has tried anything to increase her milk supply. Patient states she has been drinking lots of water, tried teas as well as drinking blue gatorade. Recommended she try fenugreek two pills three times a day & lactation cookies as well. Also provided lactation's number for patient to call. Patient verbalized understanding to all. Recommended she call us back for reglan rx should the other things not help. Patient verbalized understanding and had no other questions

## 2015-02-07 ENCOUNTER — Other Ambulatory Visit: Payer: Self-pay | Admitting: Obstetrics and Gynecology

## 2015-02-14 ENCOUNTER — Other Ambulatory Visit: Payer: Self-pay | Admitting: Obstetrics and Gynecology

## 2015-03-24 ENCOUNTER — Other Ambulatory Visit: Payer: Self-pay | Admitting: Obstetrics and Gynecology

## 2015-04-08 ENCOUNTER — Encounter: Payer: Self-pay | Admitting: Certified Nurse Midwife

## 2015-04-08 ENCOUNTER — Ambulatory Visit (INDEPENDENT_AMBULATORY_CARE_PROVIDER_SITE_OTHER): Payer: Medicaid Other | Admitting: Certified Nurse Midwife

## 2015-04-08 VITALS — Wt 248.0 lb

## 2015-04-08 DIAGNOSIS — F329 Major depressive disorder, single episode, unspecified: Secondary | ICD-10-CM

## 2015-04-08 DIAGNOSIS — N939 Abnormal uterine and vaginal bleeding, unspecified: Secondary | ICD-10-CM

## 2015-04-08 DIAGNOSIS — Z01419 Encounter for gynecological examination (general) (routine) without abnormal findings: Secondary | ICD-10-CM

## 2015-04-08 DIAGNOSIS — Z1389 Encounter for screening for other disorder: Secondary | ICD-10-CM

## 2015-04-08 DIAGNOSIS — Z809 Family history of malignant neoplasm, unspecified: Secondary | ICD-10-CM

## 2015-04-08 DIAGNOSIS — Z0000 Encounter for general adult medical examination without abnormal findings: Secondary | ICD-10-CM | POA: Diagnosis not present

## 2015-04-08 DIAGNOSIS — F32A Depression, unspecified: Secondary | ICD-10-CM

## 2015-04-08 DIAGNOSIS — Z113 Encounter for screening for infections with a predominantly sexual mode of transmission: Secondary | ICD-10-CM

## 2015-04-08 DIAGNOSIS — Z8742 Personal history of other diseases of the female genital tract: Secondary | ICD-10-CM

## 2015-04-08 LAB — CBC WITH DIFFERENTIAL/PLATELET
BASOS PCT: 0 % (ref 0–1)
Basophils Absolute: 0 10*3/uL (ref 0.0–0.1)
EOS PCT: 1 % (ref 0–5)
Eosinophils Absolute: 0.1 10*3/uL (ref 0.0–0.7)
HCT: 39.1 % (ref 36.0–46.0)
HEMOGLOBIN: 11.9 g/dL — AB (ref 12.0–15.0)
Lymphocytes Relative: 39 % (ref 12–46)
Lymphs Abs: 3 10*3/uL (ref 0.7–4.0)
MCH: 21.1 pg — ABNORMAL LOW (ref 26.0–34.0)
MCHC: 30.4 g/dL (ref 30.0–36.0)
MCV: 69.2 fL — ABNORMAL LOW (ref 78.0–100.0)
Monocytes Absolute: 0.6 10*3/uL (ref 0.1–1.0)
Monocytes Relative: 8 % (ref 3–12)
Neutro Abs: 4.1 10*3/uL (ref 1.7–7.7)
Neutrophils Relative %: 52 % (ref 43–77)
PLATELETS: 241 10*3/uL (ref 150–400)
RBC: 5.65 MIL/uL — ABNORMAL HIGH (ref 3.87–5.11)
RDW: 19 % — ABNORMAL HIGH (ref 11.5–15.5)
WBC: 7.8 10*3/uL (ref 4.0–10.5)

## 2015-04-08 LAB — POCT URINALYSIS DIPSTICK
BILIRUBIN UA: NEGATIVE
Blood, UA: NEGATIVE
Glucose, UA: NEGATIVE
Ketones, UA: NEGATIVE
Leukocytes, UA: NEGATIVE
Nitrite, UA: NEGATIVE
Spec Grav, UA: 1.025
Urobilinogen, UA: NEGATIVE
pH, UA: 5

## 2015-04-08 LAB — COMPREHENSIVE METABOLIC PANEL
ALK PHOS: 85 U/L (ref 33–115)
ALT: 12 U/L (ref 6–29)
AST: 22 U/L (ref 10–30)
Albumin: 4.5 g/dL (ref 3.6–5.1)
BILIRUBIN TOTAL: 0.3 mg/dL (ref 0.2–1.2)
BUN: 11 mg/dL (ref 7–25)
CO2: 26 mmol/L (ref 20–31)
Calcium: 9.3 mg/dL (ref 8.6–10.2)
Chloride: 101 mmol/L (ref 98–110)
Creat: 0.65 mg/dL (ref 0.50–1.10)
Glucose, Bld: 74 mg/dL (ref 65–99)
Potassium: 4.1 mmol/L (ref 3.5–5.3)
SODIUM: 137 mmol/L (ref 135–146)
TOTAL PROTEIN: 7.6 g/dL (ref 6.1–8.1)

## 2015-04-08 MED ORDER — TRANEXAMIC ACID 650 MG PO TABS: 1300.0000 mg | ORAL_TABLET | Freq: Three times a day (TID) | ORAL | Status: DC

## 2015-04-08 MED ORDER — BUPROPION HCL ER (SR) 150 MG PO TB12: 150.0000 mg | ORAL_TABLET | Freq: Two times a day (BID) | ORAL | Status: DC
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
## 2015-04-09 LAB — HIV ANTIBODY (ROUTINE TESTING W REFLEX): HIV: NONREACTIVE

## 2015-04-09 LAB — HEPATITIS C ANTIBODY: HCV AB: NEGATIVE

## 2015-04-09 LAB — RPR

## 2015-04-09 LAB — HEPATITIS B SURFACE ANTIGEN: HEP B S AG: NEGATIVE

## 2015-04-09 LAB — TSH: TSH: 90.52 mIU/L — ABNORMAL HIGH

## 2015-04-13 ENCOUNTER — Other Ambulatory Visit: Payer: Self-pay | Admitting: Certified Nurse Midwife

## 2015-04-13 ENCOUNTER — Encounter: Payer: Self-pay | Admitting: Genetic Counselor

## 2015-04-13 ENCOUNTER — Telehealth: Payer: Self-pay | Admitting: Genetic Counselor

## 2015-04-13 DIAGNOSIS — N760 Acute vaginitis: Principal | ICD-10-CM

## 2015-04-13 DIAGNOSIS — B9689 Other specified bacterial agents as the cause of diseases classified elsewhere: Secondary | ICD-10-CM

## 2015-04-13 LAB — SURESWAB, VAGINOSIS/VAGINITIS PLUS
Atopobium vaginae: 6.7 Log (cells/mL)
C. albicans, DNA: NOT DETECTED
C. glabrata, DNA: NOT DETECTED
C. parapsilosis, DNA: NOT DETECTED
C. trachomatis RNA, TMA: NOT DETECTED
C. tropicalis, DNA: NOT DETECTED
GARDNERELLA VAGINALIS: 5.6 Log (cells/mL)
LACTOBACILLUS SPECIES: NOT DETECTED Log (cells/mL)
MEGASPHAERA SPECIES: NOT DETECTED Log (cells/mL)
N. gonorrhoeae RNA, TMA: NOT DETECTED
T. vaginalis RNA, QL TMA: NOT DETECTED

## 2015-04-13 LAB — PAP, TP IMAGING W/ HPV RNA, RFLX HPV TYPE 16,18/45: HPV mRNA, High Risk: NOT DETECTED

## 2015-04-13 MED ORDER — METRONIDAZOLE 500 MG PO TABS: 500.0000 mg | ORAL_TABLET | Freq: Two times a day (BID) | ORAL | Status: DC

## 2015-04-13 NOTE — Telephone Encounter (Signed)
Genetic apptt-s/w patient and gave genetic appt for 03/30 @ 1 w/[facility] [employer]

## 2015-04-19 ENCOUNTER — Encounter: Payer: Self-pay | Admitting: *Deleted

## 2015-04-24 ENCOUNTER — Encounter: Payer: Self-pay | Admitting: Family Medicine

## 2015-05-05 ENCOUNTER — Other Ambulatory Visit: Payer: Self-pay

## 2015-05-05 ENCOUNTER — Encounter: Payer: Self-pay | Admitting: Genetic Counselor

## 2015-05-10 ENCOUNTER — Telehealth: Payer: Self-pay

## 2015-05-10 NOTE — Telephone Encounter (Signed)
patient no showed appt for genetic counseling on 3/30

## 2015-05-31 ENCOUNTER — Telehealth: Payer: Self-pay | Admitting: *Deleted

## 2015-05-31 NOTE — Telephone Encounter (Signed)
Received a Engineer, technical salesvoicemail from [place] [facility] [place] re: referral.  States requires PCP medicaid referral . States faxed over form on 18 or 21st. Needs by 27th or need to reschedule. Per chart review it appears patient is going to [facility] now.

## 2015-06-02 NOTE — Telephone Encounter (Signed)
Called Bobbi back and notified her it does not appear we made that referral and that she is not receiving care in our clinic- is going elswhere. Suggested she call patient to verify who her pcp is.

## 2015-08-30 ENCOUNTER — Other Ambulatory Visit: Payer: Self-pay | Admitting: Certified Nurse Midwife

## 2015-12-14 ENCOUNTER — Inpatient Hospital Stay (HOSPITAL_COMMUNITY)
Admission: AD | Admit: 2015-12-14 | Discharge: 2015-12-14 | Disposition: A | Discharge status: Home / Self Care | Payer: Medicaid Other | Source: Ambulatory Visit | Attending: Family Medicine | Admitting: Family Medicine

## 2015-12-14 ENCOUNTER — Inpatient Hospital Stay (HOSPITAL_COMMUNITY): Payer: Medicaid Other

## 2015-12-14 ENCOUNTER — Encounter (HOSPITAL_COMMUNITY): Payer: Self-pay

## 2015-12-14 DIAGNOSIS — O26891 Other specified pregnancy related conditions, first trimester: Secondary | ICD-10-CM | POA: Insufficient documentation

## 2015-12-14 DIAGNOSIS — O3680X0 Pregnancy with inconclusive fetal viability, not applicable or unspecified: Secondary | ICD-10-CM

## 2015-12-14 DIAGNOSIS — O23511 Infections of cervix in pregnancy, first trimester: Secondary | ICD-10-CM | POA: Insufficient documentation

## 2015-12-14 DIAGNOSIS — O26851 Spotting complicating pregnancy, first trimester: Secondary | ICD-10-CM

## 2015-12-14 DIAGNOSIS — Z6790 Unspecified blood type, Rh positive: Secondary | ICD-10-CM

## 2015-12-14 DIAGNOSIS — Z3A01 Less than 8 weeks gestation of pregnancy: Secondary | ICD-10-CM | POA: Insufficient documentation

## 2015-12-14 DIAGNOSIS — N72 Inflammatory disease of cervix uteri: Secondary | ICD-10-CM

## 2015-12-14 DIAGNOSIS — O26859 Spotting complicating pregnancy, unspecified trimester: Secondary | ICD-10-CM

## 2015-12-14 LAB — ABO/RH: ABO/RH(D): O POS

## 2015-12-14 LAB — URINALYSIS, ROUTINE W REFLEX MICROSCOPIC
BILIRUBIN URINE: NEGATIVE
Glucose, UA: NEGATIVE mg/dL
Ketones, ur: 15 mg/dL — AB
Nitrite: NEGATIVE
PROTEIN: NEGATIVE mg/dL
Specific Gravity, Urine: 1.025 (ref 1.005–1.030)
pH: 6.5 (ref 5.0–8.0)

## 2015-12-14 LAB — URINE MICROSCOPIC-ADD ON: RBC / HPF: NONE SEEN RBC/hpf (ref 0–5)

## 2015-12-14 LAB — WET PREP, GENITAL
Sperm: NONE SEEN
Trich, Wet Prep: NONE SEEN
YEAST WET PREP: NONE SEEN

## 2015-12-14 LAB — CBC
HCT: 36.6 % (ref 36.0–46.0)
Hemoglobin: 11.8 g/dL — ABNORMAL LOW (ref 12.0–15.0)
MCH: 21.3 pg — ABNORMAL LOW (ref 26.0–34.0)
MCHC: 32.2 g/dL (ref 30.0–36.0)
MCV: 66.2 fL — ABNORMAL LOW (ref 78.0–100.0)
PLATELETS: 282 10*3/uL (ref 150–400)
RBC: 5.53 MIL/uL — ABNORMAL HIGH (ref 3.87–5.11)
RDW: 18.2 % — AB (ref 11.5–15.5)
WBC: 6.1 10*3/uL (ref 4.0–10.5)

## 2015-12-14 LAB — HCG, QUANTITATIVE, PREGNANCY: HCG, BETA CHAIN, QUANT, S: 1521 m[IU]/mL — AB (ref <5)

## 2015-12-14 LAB — POCT PREGNANCY, URINE: PREG TEST UR: POSITIVE — AB

## 2015-12-14 MED ORDER — AZITHROMYCIN 250 MG PO TABS
1000.0000 mg | ORAL_TABLET | Freq: Every day | ORAL | Status: DC
  Administered 2015-12-14: 1000 mg via ORAL
  Filled 2015-12-14: qty 4

## 2015-12-14 NOTE — MAU Provider Note (Signed)
History     CSN: [id number]  Arrival date and time: 12/14/15 1053   None     Chief Complaint  Patient presents with  . Vaginal Bleeding   [name] is a 30 year-old [id number] female at 4142w1d by LMP who presents with a 3 day history of brown vaginal spotting.  Patient initially noticed vaginal spotting with lower abdominal cramping 3 days ago.  She took a home pregnancy test at that time which was positive.  Her cramping has since resolved.  She denies vaginal discharge, odor, itching, and burning.  Patient reports 1 additional partner since August.  She has no history of STI.  Patient also denies fever, chills, nausea, vomiting, abdominal pain, and diarrhea.  No urinary symptoms.       OB History    Gravida Para Term Preterm AB Living   5 3 3  0 1 3   SAB TAB Ectopic Multiple Live Births   1 0 0 0 3      Past Medical History:  Diagnosis Date  . Anxiety   . Depression   . Dizzy spells   . Endometriosis   . Headache(784.0)   . Hypertension   . PTSD (post-traumatic stress disorder)   . Sickle cell trait (HCC)   . Trauma     Past Surgical History:  Procedure Laterality Date  . DILATION AND CURETTAGE OF UTERUS  2008  . TOOTH EXTRACTION    . VAGINAL DELIVERY     X 1    Family History  Problem Relation Age of Onset  . Diabetes Father   . Hypertension Father   . Diabetes Maternal Grandmother   . Diabetes Maternal Grandfather   . Diabetes Paternal Grandmother   . Diabetes Paternal Grandfather   . Other Cousin     sickle cell disease  . Arthritis Other   . Asthma Other   . Early death Other     uncle-suicide  . Cataracts Other   . Congestive Heart Failure Other   . Hyperlipidemia Other   . Hypertension Other   . Stroke Other   . Migraines Other   . Anesthesia problems Neg Hx     Social History  Substance Use Topics  . Smoking status: Never Smoker  . Smokeless tobacco: Never Used  . Alcohol use No     Comment: occas. social  LAST  DRANK IN SEPT-2014     Allergies:  Allergies  Allergen Reactions  . Latex Hives and Rash    Prescriptions Prior to Admission  Medication Sig Dispense Refill Last Dose  . buPROPion (WELLBUTRIN SR) 150 MG 12 hr tablet Take 1 tablet (150 mg total) by mouth 2 (two) times daily. 60 tablet 2 12/13/2015 at Unknown time  . acetaminophen (TYLENOL) 500 MG tablet Take 2 tablets (1,000 mg total) by mouth every 8 (eight) hours as needed for mild pain or moderate pain. (Patient not taking: Reported on 08/15/2014) 60 tablet 0 Not Taking  . ibuprofen (ADVIL,MOTRIN) 600 MG tablet Take 1 tablet (600 mg total) by mouth every 6 (six) hours as needed for mild pain, moderate pain or cramping. (Patient not taking: Reported on 12/14/2015) 60 tablet 0 Not Taking at Unknown time  . metroNIDAZOLE (FLAGYL) 500 MG tablet Take 1 tablet (500 mg total) by mouth 2 (two) times daily. (Patient not taking: Reported on 12/14/2015) 14 tablet 0 Not Taking at Unknown time  . Prenat-FeFum-FePo-FA-Omega 3 (CONCEPT DHA) 53.5-38-1 MG CAPS Take 1 tablet by mouth daily. (Patient  not taking: Reported on 12/14/2015) 30 capsule 5 Not Taking at Unknown time  . sertraline (ZOLOFT) 100 MG tablet TAKE 1 TABLET BY MOUTH EVERY DAY (Patient not taking: Reported on 12/14/2015) 30 tablet 0 Not Taking at Unknown time  . tranexamic acid (LYSTEDA) 650 MG TABS tablet Take 2 tablets (1,300 mg total) by mouth 3 (three) times daily. (Patient not taking: Reported on 12/14/2015) 60 tablet 4 Not Taking at Unknown time  . valACYclovir (VALTREX) 500 MG tablet Take 1 tablet (500 mg total) by mouth 2 (two) times daily. (Patient not taking: Reported on 08/15/2014) 30 tablet 6 Not Taking    Review of Systems  Constitutional: Negative.   Gastrointestinal: Negative.    Physical Exam   Blood pressure 132/81, pulse 110, temperature 98.3 F (36.8 C), temperature source Oral, resp. rate 18, last menstrual period 10/25/2015, currently breastfeeding.  Physical Exam  Constitutional: She is  oriented to person, place, and time. She appears well-developed and well-nourished. No distress.  HENT:  Head: Normocephalic and atraumatic.  Neck: Normal range of motion. Neck supple.  Cardiovascular: Normal rate.   Respiratory: Effort normal.  GI: Soft. She exhibits no distension and no mass. There is no tenderness. There is no rebound and no guarding.  Genitourinary:  Genitourinary Comments: External: no lesions Vagina: rugated, parous/ nulli, small purulent cerivcal discharge, friable cervix, bled easily with gentle q-tip contact Uterus: non enlarged, anteverted, non tender, no CMT Adnexae: no masses, no tenderness left, no tenderness right   Musculoskeletal: Normal range of motion.  Neurological: She is alert and oriented to person, place, and time.  Skin: Skin is warm and dry.  Psychiatric: She has a normal mood and affect.   Results for orders placed or performed during the hospital encounter of 12/14/15 (from the past 24 hour(s))  Urinalysis, Routine w reflex microscopic (not at [facility])     Status: Abnormal   Collection Time: 12/14/15 11:00 AM  Result Value Ref Range   Color, Urine YELLOW YELLOW   APPearance CLEAR CLEAR   Specific Gravity, Urine 1.025 1.005 - 1.030   pH 6.5 5.0 - 8.0   Glucose, UA NEGATIVE NEGATIVE mg/dL   Hgb urine dipstick SMALL (A) NEGATIVE   Bilirubin Urine NEGATIVE NEGATIVE   Ketones, ur 15 (A) NEGATIVE mg/dL   Protein, ur NEGATIVE NEGATIVE mg/dL   Nitrite NEGATIVE NEGATIVE   Leukocytes, UA SMALL (A) NEGATIVE  Urine microscopic-add on     Status: Abnormal   Collection Time: 12/14/15 11:00 AM  Result Value Ref Range   Squamous Epithelial / LPF 0-5 (A) NONE SEEN   WBC, UA 6-30 0 - 5 WBC/hpf   RBC / HPF NONE SEEN 0 - 5 RBC/hpf   Bacteria, UA RARE (A) NONE SEEN  Pregnancy, urine POC     Status: Abnormal   Collection Time: 12/14/15 11:02 AM  Result Value Ref Range   Preg Test, Ur POSITIVE (A) NEGATIVE  CBC     Status: Abnormal   Collection Time:  12/14/15 11:30 AM  Result Value Ref Range   WBC 6.1 4.0 - 10.5 K/uL   RBC 5.53 (H) 3.87 - 5.11 MIL/uL   Hemoglobin 11.8 (L) 12.0 - 15.0 g/dL   HCT 16.1 09.6 - 04.5 %   MCV 66.2 (L) 78.0 - 100.0 fL   MCH 21.3 (L) 26.0 - 34.0 pg   MCHC 32.2 30.0 - 36.0 g/dL   RDW 40.9 (H) 81.1 - 91.4 %   Platelets 282 150 - 400 K/uL  ABO/Rh     Status: None   Collection Time: 12/14/15 11:30 AM  Result Value Ref Range   ABO/RH(D) O POS   hCG, quantitative, pregnancy     Status: Abnormal   Collection Time: 12/14/15 11:30 AM  Result Value Ref Range   hCG, Beta Chain, Quant, S 1,521 (H) <5 mIU/mL  Wet prep, genital     Status: Abnormal   Collection Time: 12/14/15 11:38 AM  Result Value Ref Range   Yeast Wet Prep HPF POC NONE SEEN NONE SEEN   Trich, Wet Prep NONE SEEN NONE SEEN   Clue Cells Wet Prep HPF POC PRESENT (A) NONE SEEN   WBC, Wet Prep HPF POC FEW (A) NONE SEEN   Sperm NONE SEEN    [place] Ob Comp Less 14 Wks  Result Date: 12/14/2015 CLINICAL DATA:  Vaginal bleeding since 12/11/2015 EXAM: OBSTETRIC <14 WK US AND TRANSVAGINAL OB US TECHNIQUE: Both transabdominal and transvaginal ultrasound examinations were performed for complete evaluation of the gestation as well as the maternal uterus, adnexal regions, and pelvic cul-de-sac. Transvaginal technique was performed to assess early pregnancy. COMPARISON:  None FINDINGS: Intrauterine gestational sac: Single Yolk sac:  Not Visualized. Embryo:  Not Visualized. Cardiac Activity: Not Visualized. MSD: 4.6  mm   5 w   1  d Maternal uterus/adnexae: Subchorionic hemorrhage: Small subchorionic hemorrhage noted. Right ovary: Normal Left ovary: Normal Other :None Free fluid:  None IMPRESSION: 1. Probable early intrauterine gestational sac, but no yolk sac, fetal pole, or cardiac activity yet visualized. Recommend follow-up quantitative B-HCG levels and follow-up US in 14 days to confirm and assess viability. This recommendation follows SRU consensus guidelines:  Diagnostic Criteria for Nonviable Pregnancy Early in the First Trimester. Malva Limes Engl J Med 2013; 782:9562-13; 369:1443-51. 2. Small subchorionic hemorrhage. Electronically Signed   By: Signa Kellaylor  Stroud M.D.   On: 12/14/2015 12:35   [place] Ob Transvaginal  Result Date: 12/14/2015 CLINICAL DATA:  Vaginal bleeding since 12/11/2015 EXAM: OBSTETRIC <14 WK US AND TRANSVAGINAL OB US TECHNIQUE: Both transabdominal and transvaginal ultrasound examinations were performed for complete evaluation of the gestation as well as the maternal uterus, adnexal regions, and pelvic cul-de-sac. Transvaginal technique was performed to assess early pregnancy. COMPARISON:  None FINDINGS: Intrauterine gestational sac: Single Yolk sac:  Not Visualized. Embryo:  Not Visualized. Cardiac Activity: Not Visualized. MSD: 4.6  mm   5 w   1  d Maternal uterus/adnexae: Subchorionic hemorrhage: Small subchorionic hemorrhage noted. Right ovary: Normal Left ovary: Normal Other :None Free fluid:  None IMPRESSION: 1. Probable early intrauterine gestational sac, but no yolk sac, fetal pole, or cardiac activity yet visualized. Recommend follow-up quantitative B-HCG levels and follow-up US in 14 days to confirm and assess viability. This recommendation follows SRU consensus guidelines: Diagnostic Criteria for Nonviable Pregnancy Early in the First Trimester. Malva Limes Engl J Med 2013; 086:5784-69; 369:1443-51. 2. Small subchorionic hemorrhage. Electronically Signed   By: Signa Kellaylor  Stroud M.D.   On: 12/14/2015 12:35    MAU Course  Procedures  MDM Labs ordered and reviewed. Likely early pregnancy but cannot r/o ectopic. Plan for quant f/u. Will treat for cervicitis, GC/CMT pending. Stable for discharge home  Assessment and Plan   1. Spotting in pregnancy   2. Spotting affecting pregnancy   3. Rh(D) positive   4. Cervicitis   5. Pregnancy of unknown anatomic location    Discharge home Ectopic precautions Follow up in [facility] in 2 days at 8:00 am for quant    Medication List  STOP  taking these medications   acetaminophen 500 MG tablet Commonly known as:  TYLENOL   ibuprofen 600 MG tablet Commonly known as:  ADVIL,MOTRIN   metroNIDAZOLE 500 MG tablet Commonly known as:  FLAGYL   sertraline 100 MG tablet Commonly known as:  ZOLOFT   tranexamic acid 650 MG Tabs tablet Commonly known as:  LYSTEDA   valACYclovir 500 MG tablet Commonly known as:  VALTREX     TAKE these medications   buPROPion 150 MG 12 hr tablet Commonly known as:  WELLBUTRIN SR Take 1 tablet (150 mg total) by mouth 2 (two) times daily.   CONCEPT DHA 53.5-38-1 MG Caps Take 1 tablet by mouth daily.       Donette Larry, CNM 12/14/2015, 11:44 AM

## 2015-12-14 NOTE — MAU Note (Signed)
Pt presents to MAU with vaginal spotting after +HPT. Began spotting on Sunday. Also having some cramping that comes and goes. Denies any abnormal discharge. Pt wants to be sure all is ok with pregnancy.

## 2015-12-14 NOTE — MAU Provider Note (Signed)
History    Chief Complaint  Patient presents with  . Vaginal Bleeding   [name] is a 30 year-old [id number] female at 7w 1d by LMP who presents with a 3 day history of vaginal spotting.  Patient initially noticed vaginal spotting with lower abdominal cramping 3 days ago.  She took a home pregnancy test at that time which was positive.  Her cramping has since resolved.  She denies vaginal discharge, odor, itching, and burning.  Patient reports 1 additional partner since August.  She has no history of STI.  Patient also denies fever, chills, nausea, vomiting, abdominal pain, and diarrhea.  No urinary symptoms.   Of note, she has a history of hypertension with super-imposed pre-eclampsia with her last pregnancy but is not on any BP meds.  No other complaints.      OB History    Gravida Para Term Preterm AB Living   5 3 3  0 1 3   SAB TAB Ectopic Multiple Live Births   1 0 0 0 3      Past Medical History:  Diagnosis Date  . Anxiety   . Depression   . Dizzy spells   . Endometriosis   . Headache(784.0)   . Hypertension   . PTSD (post-traumatic stress disorder)   . Sickle cell trait (HCC)   . Trauma     Past Surgical History:  Procedure Laterality Date  . DILATION AND CURETTAGE OF UTERUS  2008  . TOOTH EXTRACTION    . VAGINAL DELIVERY     X 1    Family History  Problem Relation Age of Onset  . Diabetes Father   . Hypertension Father   . Diabetes Maternal Grandmother   . Diabetes Maternal Grandfather   . Diabetes Paternal Grandmother   . Diabetes Paternal Grandfather   . Other Cousin     sickle cell disease  . Arthritis Other   . Asthma Other   . Early death Other     uncle-suicide  . Cataracts Other   . Congestive Heart Failure Other   . Hyperlipidemia Other   . Hypertension Other   . Stroke Other   . Migraines Other   . Anesthesia problems Neg Hx     Social History  Substance Use Topics  . Smoking status: Never Smoker  . Smokeless tobacco: Never Used   . Alcohol use No     Comment: occas. social  LAST  DRANK IN SEPT-2014    Allergies:  Allergies  Allergen Reactions  . Latex Hives and Rash    Prescriptions Prior to Admission  Medication Sig Dispense Refill Last Dose  . buPROPion (WELLBUTRIN SR) 150 MG 12 hr tablet Take 1 tablet (150 mg total) by mouth 2 (two) times daily. 60 tablet 2 12/13/2015 at Unknown time  . acetaminophen (TYLENOL) 500 MG tablet Take 2 tablets (1,000 mg total) by mouth every 8 (eight) hours as needed for mild pain or moderate pain. (Patient not taking: Reported on 08/15/2014) 60 tablet 0 Not Taking  . ibuprofen (ADVIL,MOTRIN) 600 MG tablet Take 1 tablet (600 mg total) by mouth every 6 (six) hours as needed for mild pain, moderate pain or cramping. (Patient not taking: Reported on 12/14/2015) 60 tablet 0 Not Taking at Unknown time  . metroNIDAZOLE (FLAGYL) 500 MG tablet Take 1 tablet (500 mg total) by mouth 2 (two) times daily. (Patient not taking: Reported on 12/14/2015) 14 tablet 0 Not Taking at Unknown time  . Prenat-FeFum-FePo-FA-Omega 3 (CONCEPT DHA)  53.5-38-1 MG CAPS Take 1 tablet by mouth daily. (Patient not taking: Reported on 12/14/2015) 30 capsule 5 Not Taking at Unknown time  . sertraline (ZOLOFT) 100 MG tablet TAKE 1 TABLET BY MOUTH EVERY DAY (Patient not taking: Reported on 12/14/2015) 30 tablet 0 Not Taking at Unknown time  . tranexamic acid (LYSTEDA) 650 MG TABS tablet Take 2 tablets (1,300 mg total) by mouth 3 (three) times daily. (Patient not taking: Reported on 12/14/2015) 60 tablet 4 Not Taking at Unknown time  . valACYclovir (VALTREX) 500 MG tablet Take 1 tablet (500 mg total) by mouth 2 (two) times daily. (Patient not taking: Reported on 08/15/2014) 30 tablet 6 Not Taking    Review of Systems  Constitutional: Negative for chills and fever.  HENT: Negative.   Respiratory: Negative for shortness of breath.   Cardiovascular: Negative.   Gastrointestinal: Negative for abdominal pain, diarrhea, nausea and  vomiting.  Genitourinary: Negative.   Musculoskeletal: Negative.   Skin: Negative.   Neurological: Negative.   Psychiatric/Behavioral: Negative.    Physical Exam Blood pressure 132/81, pulse 110, temperature 98.3 F (36.8 C), temperature source Oral, resp. rate 18, last menstrual period 10/25/2015, currently breastfeeding. Physical Exam  Constitutional: She is oriented to person, place, and time. She appears well-developed and well-nourished. No distress.  HENT:  Head: Normocephalic and atraumatic.  Neck: Normal range of motion. Neck supple.  Respiratory: Effort normal. No respiratory distress.  GI: Soft. She exhibits no distension.  Genitourinary: Vagina normal. Cervix exhibits discharge (Small amount of purulent, yellow, and odorless discharge around the cervix) and friability (Small amount of cervical bleeding after gentle swab). Right adnexum displays no tenderness. Left adnexum displays no tenderness.  Musculoskeletal: Normal range of motion.  Neurological: She is alert and oriented to person, place, and time.  Skin: Skin is warm and dry.  Psychiatric: She has a normal mood and affect. Her behavior is normal.   Results for orders placed or performed during the hospital encounter of 12/14/15 (from the past 24 hour(s))  Urinalysis, Routine w reflex microscopic ([facility])     Status: Abnormal   Collection Time: 12/14/15 11:00 AM  Result Value Ref Range   Color, Urine YELLOW YELLOW   APPearance CLEAR CLEAR   Specific Gravity, Urine 1.025 1.005 - 1.030   pH 6.5 5.0 - 8.0   Glucose, UA NEGATIVE NEGATIVE mg/dL   Hgb urine dipstick SMALL (A) NEGATIVE   Bilirubin Urine NEGATIVE NEGATIVE   Ketones, ur 15 (A) NEGATIVE mg/dL   Protein, ur NEGATIVE NEGATIVE mg/dL   Nitrite NEGATIVE NEGATIVE   Leukocytes, UA SMALL (A) NEGATIVE  Urine microscopic-add on     Status: Abnormal   Collection Time: 12/14/15 11:00 AM  Result Value Ref Range   Squamous Epithelial / LPF 0-5 (A) NONE SEEN    WBC, UA 6-30 0 - 5 WBC/hpf   RBC / HPF NONE SEEN 0 - 5 RBC/hpf   Bacteria, UA RARE (A) NONE SEEN  Pregnancy, urine POC     Status: Abnormal   Collection Time: 12/14/15 11:02 AM  Result Value Ref Range   Preg Test, Ur POSITIVE (A) NEGATIVE  CBC     Status: Abnormal   Collection Time: 12/14/15 11:30 AM  Result Value Ref Range   WBC 6.1 4.0 - 10.5 K/uL   RBC 5.53 (H) 3.87 - 5.11 MIL/uL   Hemoglobin 11.8 (L) 12.0 - 15.0 g/dL   HCT 54.036.6 98.136.0 - 19.146.0 %   MCV 66.2 (L) 78.0 - 100.0  fL   MCH 21.3 (L) 26.0 - 34.0 pg   MCHC 32.2 30.0 - 36.0 g/dL   RDW 16.1 (H) 09.6 - 04.5 %   Platelets 282 150 - 400 K/uL  ABO/Rh     Status: None   Collection Time: 12/14/15 11:30 AM  Result Value Ref Range   ABO/RH(D) O POS   Wet prep, genital     Status: Abnormal   Collection Time: 12/14/15 11:38 AM  Result Value Ref Range   Yeast Wet Prep HPF POC NONE SEEN NONE SEEN   Trich, Wet Prep NONE SEEN NONE SEEN   Clue Cells Wet Prep HPF POC PRESENT (A) NONE SEEN   WBC, Wet Prep HPF POC FEW (A) NONE SEEN   Sperm NONE SEEN      MAU Course Procedures  MDM Speculum exam Wet prep- positive for clue cells GC/chlamydia- pending CBC Transvaginal ultrasound B-hcg ABO   Assessment [name] is a 30 year-old G7P3013 female at [redacted]w[redacted]d by LMP who presents with vaginal spotting and lower abdominal cramping in the setting of a new sexual partner.  Her speculum exam was significant for a small amount of yellow purulent cervical discharge and cervical friability concerning for cervicitis.  Ultrasound shows a probable gestational sac without fetal pole, yolk sac, or heartbeat.  Unable to rule in viable IUP.  There was also a small subchorionic hemorrhage.    Plan Follow up in 2 days in [facility] for 48 hour repeat b-hcg to confirm viable IUP Follow up US in 14 days.  Ectopic precautions Prenatal vitamins Start prenatal care Azithromycin for presumptive cervicitis  Midwife attestation:  I have seen and examined  this patient; I agree with above documentation in the student's note.  See MAU Provider note for details.  Donette Larry, CNM  2:32 PM

## 2015-12-14 NOTE — Discharge Instructions (Signed)

## 2015-12-15 LAB — GC/CHLAMYDIA PROBE AMP (~~LOC~~) NOT AT ARMC
Chlamydia: POSITIVE — AB
NEISSERIA GONORRHEA: POSITIVE — AB

## 2015-12-16 ENCOUNTER — Ambulatory Visit: Payer: Medicaid Other | Admitting: *Deleted

## 2015-12-16 DIAGNOSIS — Z3490 Encounter for supervision of normal pregnancy, unspecified, unspecified trimester: Secondary | ICD-10-CM

## 2015-12-16 LAB — HCG, QUANTITATIVE, PREGNANCY: hCG, Beta Chain, Quant, S: 2809 m[IU]/mL — ABNORMAL HIGH (ref <5)

## 2015-12-16 NOTE — Progress Notes (Signed)
Pt in for 48 hr repeat hcg level to assess viability. Pt denies pain or bleeding. Pts hcg is 2809. Reviewed with Dr. Adrian BlackwaterStinson he recommends repeat ultrasound in 14 days from the last one. Ultrasound scheduled for 01/02/16 at 1pm. Called patient at the number she left for us and heard voice mail. Asked patient to call back for results.

## 2015-12-19 ENCOUNTER — Telehealth (HOSPITAL_COMMUNITY): Payer: Self-pay | Admitting: *Deleted

## 2015-12-19 NOTE — Telephone Encounter (Signed)
Discussed positive results  Of recent vaginal exam. Positive for GC & Chlamydia.  Pt. previous [facility]  [facility] with [facility] and advised to avoid intercourse for two weeks following treatment.

## 2015-12-21 ENCOUNTER — Encounter: Payer: Self-pay | Admitting: *Deleted

## 2016-01-02 ENCOUNTER — Ambulatory Visit (HOSPITAL_COMMUNITY)
Admission: RE | Admit: 2016-01-02 | Discharge: 2016-01-02 | Disposition: A | Discharge status: Home / Self Care | Payer: Medicaid Other | Source: Ambulatory Visit | Attending: Family Medicine | Admitting: Family Medicine

## 2016-01-02 ENCOUNTER — Ambulatory Visit (INDEPENDENT_AMBULATORY_CARE_PROVIDER_SITE_OTHER): Payer: Medicaid Other | Admitting: *Deleted

## 2016-01-02 ENCOUNTER — Encounter: Payer: Self-pay | Admitting: Family Medicine

## 2016-01-02 DIAGNOSIS — Z23 Encounter for immunization: Secondary | ICD-10-CM | POA: Diagnosis present

## 2016-01-02 DIAGNOSIS — Z3490 Encounter for supervision of normal pregnancy, unspecified, unspecified trimester: Secondary | ICD-10-CM | POA: Diagnosis present

## 2016-01-02 DIAGNOSIS — Z3A01 Less than 8 weeks gestation of pregnancy: Secondary | ICD-10-CM | POA: Diagnosis not present

## 2016-01-02 NOTE — Progress Notes (Signed)
Pt was not aware of the purpose of her appt today.  She desires flu vaccine and agrees to receive today. She has ultrasound for pregnancy viability confirmation today @ 1300.

## 2016-02-06 NOTE — L&D Delivery Note (Signed)
Delivery Note  PDIOL. Augmented with cytotec, pitocin, and arom.   At 3:16 PM a viable female was delivered via Vaginal, Spontaneous Delivery (Presentation: OA  ).  APGAR: 9, 9; weight pending .   Placenta status: intact .  Cord: 3v.  with the following complications: none.  Cord pH: not obtained  Anesthesia:  none Episiotomy: None Lacerations: None Est. Blood Loss (mL): 200  Mom to postpartum.  Baby to Couplet care / Skin to Skin.  Cherrie Gauzeoah B Scotlyn Mccranie 08/21/2016, 3:41 PM

## 2016-04-25 ENCOUNTER — Telehealth: Payer: Self-pay

## 2016-04-25 NOTE — Telephone Encounter (Signed)
Left message for pt to call office to set up new ob appointment. Patient lives in [place] and I will set appt at that office.

## 2016-05-01 ENCOUNTER — Inpatient Hospital Stay (HOSPITAL_COMMUNITY): Payer: Medicaid Other

## 2016-05-01 ENCOUNTER — Encounter (HOSPITAL_COMMUNITY): Payer: Self-pay | Admitting: *Deleted

## 2016-05-01 ENCOUNTER — Inpatient Hospital Stay (HOSPITAL_COMMUNITY)
Admission: AD | Admit: 2016-05-01 | Discharge: 2016-05-01 | Disposition: A | Discharge status: Home / Self Care | Payer: Medicaid Other | Source: Ambulatory Visit | Attending: Family Medicine | Admitting: Family Medicine

## 2016-05-01 DIAGNOSIS — Z3A25 25 weeks gestation of pregnancy: Secondary | ICD-10-CM | POA: Diagnosis not present

## 2016-05-01 DIAGNOSIS — O26852 Spotting complicating pregnancy, second trimester: Secondary | ICD-10-CM | POA: Insufficient documentation

## 2016-05-01 DIAGNOSIS — O23592 Infection of other part of genital tract in pregnancy, second trimester: Secondary | ICD-10-CM | POA: Diagnosis not present

## 2016-05-01 DIAGNOSIS — N760 Acute vaginitis: Secondary | ICD-10-CM

## 2016-05-01 DIAGNOSIS — B9689 Other specified bacterial agents as the cause of diseases classified elsewhere: Secondary | ICD-10-CM | POA: Insufficient documentation

## 2016-05-01 DIAGNOSIS — Z7982 Long term (current) use of aspirin: Secondary | ICD-10-CM | POA: Diagnosis not present

## 2016-05-01 DIAGNOSIS — O4692 Antepartum hemorrhage, unspecified, second trimester: Secondary | ICD-10-CM

## 2016-05-01 HISTORY — DX: Hypothyroidism, unspecified: E03.9

## 2016-05-01 LAB — WET PREP, GENITAL
SPERM: NONE SEEN
TRICH WET PREP: NONE SEEN
YEAST WET PREP: NONE SEEN

## 2016-05-01 LAB — URINALYSIS, ROUTINE W REFLEX MICROSCOPIC
Bilirubin Urine: NEGATIVE
Glucose, UA: NEGATIVE mg/dL
Ketones, ur: NEGATIVE mg/dL
Nitrite: NEGATIVE
PROTEIN: 30 mg/dL — AB
SPECIFIC GRAVITY, URINE: 1.023 (ref 1.005–1.030)
pH: 7 (ref 5.0–8.0)

## 2016-05-01 MED ORDER — METRONIDAZOLE 500 MG PO TABS: 500.0000 mg | ORAL_TABLET | Freq: Two times a day (BID) | ORAL | 0 refills | Status: DC

## 2016-05-01 MED ORDER — ACETAMINOPHEN 325 MG PO TABS
650.0000 mg | ORAL_TABLET | Freq: Once | ORAL | Status: AC
  Administered 2016-05-01: 650 mg via ORAL
  Filled 2016-05-01: qty 2

## 2016-05-01 NOTE — MAU Provider Note (Signed)
History     CSN: [id number]  Arrival date and time: 05/01/16 1023  HPI  Danielle [name] is a 31 yo G5P3013 at [redacted]w[redacted]d who presents with left lower abdominal cramping, spotting, and vaginal discharge.  Her spotting/cramping started last night.  The spotting has stopped but the cramping persists.  It is constant, but worse with movement.  It does not feel like contractions.  The vaginal discharge started about 1 week ago - clear to yellow colored, minimal discharge.  She reports vaginal irritation but no itching, pain, or swelling.  She denies sexual activity since her last STI test.  She denies fever, dysuria. She does have regular fetal movement.  No LOF.  OB History    Gravida Para Term Preterm AB Living   5 3 3  0 1 3   SAB TAB Ectopic Multiple Live Births   1 0 0 0 3      Past Medical History:  Diagnosis Date  . Anxiety   . Depression   . Dizzy spells   . Endometriosis   . Headache(784.0)   . Hypertension   . Hypothyroidism   . PTSD (post-traumatic stress disorder)   . Sickle cell trait (HCC)   . Trauma     Past Surgical History:  Procedure Laterality Date  . DILATION AND CURETTAGE OF UTERUS  2008  . TOOTH EXTRACTION    . VAGINAL DELIVERY     X 1    Family History  Problem Relation Age of Onset  . Diabetes Father   . Hypertension Father   . Diabetes Maternal Grandmother   . Diabetes Maternal Grandfather   . Diabetes Paternal Grandmother   . Diabetes Paternal Grandfather   . Other Cousin     sickle cell disease  . Arthritis Other   . Asthma Other   . Early death Other     uncle-suicide  . Cataracts Other   . Congestive Heart Failure Other   . Hyperlipidemia Other   . Hypertension Other   . Stroke Other   . Migraines Other   . Anesthesia problems Neg Hx     Social History  Substance Use Topics  . Smoking status: Never Smoker  . Smokeless tobacco: Never Used  . Alcohol use No     Comment: occas. social  LAST  DRANK IN SEPT-2014    Allergies:   Allergies  Allergen Reactions  . Latex Hives and Rash    Prescriptions Prior to Admission  Medication Sig Dispense Refill Last Dose  . aspirin 81 MG chewable tablet Chew 81 mg by mouth daily.   04/30/2016 at Unknown time  . Prenat-FeFum-FePo-FA-Omega 3 (CONCEPT DHA) 53.5-38-1 MG CAPS Take 1 tablet by mouth daily. 30 capsule 5 04/30/2016 at Unknown time  . buPROPion (WELLBUTRIN SR) 150 MG 12 hr tablet Take 1 tablet (150 mg total) by mouth 2 (two) times daily. 60 tablet 2 12/13/2015 at Unknown time    Review of Systems  Constitutional: Negative for fever.  HENT: Negative for congestion and sore throat.   Eyes: Negative for visual disturbance.  Respiratory: Negative for shortness of breath.   Cardiovascular: Negative for chest pain and leg swelling.  Gastrointestinal: Negative for constipation, diarrhea, nausea and vomiting.  Genitourinary: Positive for vaginal bleeding and vaginal discharge. Negative for dysuria, genital sores, pelvic pain and vaginal pain.  Skin: Negative for rash.  Neurological: Positive for headaches.   Physical Exam   Blood pressure 128/61, pulse (!) 108, temperature 97.6 F (36.4 C), resp. rate  18, height 5\' 8"  (1.727 m), weight 249 lb (112.9 kg), last menstrual period 10/25/2015, currently breastfeeding.  Physical Exam  Constitutional: She is oriented to person, place, and time. She appears well-developed and well-nourished.  HENT:  Head: Normocephalic.  Right Ear: External ear normal.  Left Ear: External ear normal.  Mouth/Throat: Oropharynx is clear and moist.  Eyes: Conjunctivae and EOM are normal.  Neck: Normal range of motion. Neck supple.  Cardiovascular: Normal rate and regular rhythm.   No murmur heard. Respiratory: Effort normal and breath sounds normal. She has no wheezes. She has no rales.  GI: Soft. There is no tenderness.  Musculoskeletal: She exhibits no edema.  Neurological: She is alert and oriented to person, place, and time.  Skin: Skin  is warm and dry.  Psychiatric: She has a normal mood and affect.   MAU Course  Procedures  MDM Ultrasound without evidence of previa or abruption Wet prep positive for clue cells and bacteria GC/CT pending SVE: closed/thick (cervical length 42 mm on US) FHT:  Appropriate for gestational age  Assessment and Plan  31 yo [phone] at 25.0 presenting with spotting, cramping, and vaginal discharge. -not in preterm labor -sx consistent with bacterial vaginosis -treat with flagyl -discharge and follow up with regular OB PCP as scheduled.  Charlsie MerlesJulia Rhoden 05/01/2016, 12:50 PM   The patient was seen and examined by me also Agree with note NST reactive and reassuring UCs as listed Cervical exams as listed in note Ready for discharge Will have her followup in clinic as scheduled Aviva SignsMarie L Sadao Weyer, CNM

## 2016-05-01 NOTE — Discharge Instructions (Signed)

## 2016-05-01 NOTE — MAU Note (Signed)
Pt presents to MAU with complaints of pain in her left lower abdomen that started last night with scant amount of vaginal bleeding. Denies intercourse since October

## 2016-05-03 LAB — GC/CHLAMYDIA PROBE AMP (~~LOC~~) NOT AT ARMC
Chlamydia: NEGATIVE
Neisseria Gonorrhea: NEGATIVE

## 2016-05-08 ENCOUNTER — Encounter: Payer: Self-pay | Admitting: Obstetrics and Gynecology

## 2016-05-08 NOTE — Progress Notes (Signed)
Patient did not keep new OB appointment for 05/08/2016.  Cornelia Copa MD Attending Center for [employer] Midwife)

## 2016-05-29 ENCOUNTER — Encounter (HOSPITAL_COMMUNITY): Payer: Self-pay | Admitting: *Deleted

## 2016-05-29 ENCOUNTER — Inpatient Hospital Stay (HOSPITAL_COMMUNITY)
Admission: AD | Admit: 2016-05-29 | Discharge: 2016-05-29 | Disposition: A | Discharge status: Home / Self Care | Payer: Medicaid Other | Source: Ambulatory Visit | Attending: Family Medicine | Admitting: Family Medicine

## 2016-05-29 DIAGNOSIS — B372 Candidiasis of skin and nail: Secondary | ICD-10-CM | POA: Diagnosis not present

## 2016-05-29 DIAGNOSIS — Z79899 Other long term (current) drug therapy: Secondary | ICD-10-CM | POA: Insufficient documentation

## 2016-05-29 DIAGNOSIS — Z7982 Long term (current) use of aspirin: Secondary | ICD-10-CM | POA: Diagnosis not present

## 2016-05-29 DIAGNOSIS — O98813 Other maternal infectious and parasitic diseases complicating pregnancy, third trimester: Secondary | ICD-10-CM | POA: Diagnosis present

## 2016-05-29 DIAGNOSIS — O9989 Other specified diseases and conditions complicating pregnancy, childbirth and the puerperium: Secondary | ICD-10-CM | POA: Diagnosis not present

## 2016-05-29 DIAGNOSIS — R030 Elevated blood-pressure reading, without diagnosis of hypertension: Secondary | ICD-10-CM

## 2016-05-29 HISTORY — DX: Gestational (pregnancy-induced) hypertension without significant proteinuria, unspecified trimester: O13.9

## 2016-05-29 LAB — COMPREHENSIVE METABOLIC PANEL
ALBUMIN: 3.2 g/dL — AB (ref 3.5–5.0)
ALT: 9 U/L — ABNORMAL LOW (ref 14–54)
ANION GAP: 7 (ref 5–15)
AST: 16 U/L (ref 15–41)
Alkaline Phosphatase: 52 U/L (ref 38–126)
BILIRUBIN TOTAL: 0.4 mg/dL (ref 0.3–1.2)
BUN: 6 mg/dL (ref 6–20)
CO2: 21 mmol/L — ABNORMAL LOW (ref 22–32)
Calcium: 8.8 mg/dL — ABNORMAL LOW (ref 8.9–10.3)
Chloride: 106 mmol/L (ref 101–111)
Creatinine, Ser: 0.4 mg/dL — ABNORMAL LOW (ref 0.44–1.00)
GFR calc Af Amer: >60 mL/min (ref >60)
Glucose, Bld: 108 mg/dL — ABNORMAL HIGH (ref 65–99)
POTASSIUM: 3.7 mmol/L (ref 3.5–5.1)
Sodium: 134 mmol/L — ABNORMAL LOW (ref 135–145)
TOTAL PROTEIN: 6.9 g/dL (ref 6.5–8.1)

## 2016-05-29 LAB — URINALYSIS, ROUTINE W REFLEX MICROSCOPIC
Bilirubin Urine: NEGATIVE
GLUCOSE, UA: NEGATIVE mg/dL
Ketones, ur: 5 mg/dL — AB
Nitrite: NEGATIVE
PH: 6 (ref 5.0–8.0)
Protein, ur: 100 mg/dL — AB
Specific Gravity, Urine: 1.029 (ref 1.005–1.030)

## 2016-05-29 LAB — CBC
HCT: 32 % — ABNORMAL LOW (ref 36.0–46.0)
Hemoglobin: 10.2 g/dL — ABNORMAL LOW (ref 12.0–15.0)
MCH: 21.3 pg — ABNORMAL LOW (ref 26.0–34.0)
MCHC: 31.9 g/dL (ref 30.0–36.0)
MCV: 66.9 fL — ABNORMAL LOW (ref 78.0–100.0)
Platelets: 199 10*3/uL (ref 150–400)
RBC: 4.78 MIL/uL (ref 3.87–5.11)
RDW: 17.6 % — AB (ref 11.5–15.5)
WBC: 7.3 10*3/uL (ref 4.0–10.5)

## 2016-05-29 LAB — PROTEIN / CREATININE RATIO, URINE
CREATININE, URINE: 331 mg/dL
PROTEIN CREATININE RATIO: 0.17 mg/mg{creat} — AB (ref 0.00–0.15)
TOTAL PROTEIN, URINE: 55 mg/dL

## 2016-05-29 MED ORDER — NYSTATIN 100000 UNIT/GM EX CREA: TOPICAL_CREAM | CUTANEOUS | 0 refills | Status: DC

## 2016-05-29 MED ORDER — TRIAMCINOLONE ACETONIDE 0.1 % EX CREA: 1.0000 "application " | TOPICAL_CREAM | Freq: Two times a day (BID) | CUTANEOUS | 0 refills | Status: DC

## 2016-05-29 NOTE — MAU Provider Note (Signed)
History     CSN: [id number]  Arrival date and time: 05/29/16 1701  First Provider Initiated Contact with Patient 05/29/16 1751      Chief Complaint  Patient presents with  . vaginal rash   HPI Danielle [name] is a 31 y.o. [id number] at [redacted]w[redacted]d who presents with vaginal rash. Reports symptoms began the week after completing a course of flagyl for BV. Reports itching & burning of bilateral labia & groin. Has not treated. Denies vaginal discharge or internal vaginal itching.  Patient states she was going to [facility] for prenatal care; last seen there in March. States she is transferring care to [facility] & is supposed to have an appointment tomorrow (although the only appt scheduled was an initial prenatal visit 4/3 that was "no show").  Reports history of preeclampsia with previous pregnancy. Denies hypertension outside of pregnancy. Denies headache, visual disturbance, or epigastric pain.   OB History    Gravida Para Term Preterm AB Living   0 1 3   SAB TAB Ectopic Multiple Live Births   1 0 0 0 3      Past Medical History:  Diagnosis Date  . Anxiety   . Depression   . Dizzy spells   . Endometriosis   . Headache(784.0)   . Hypertension    PIH  . Hypothyroidism   . Pregnancy induced hypertension 2016  . PTSD (post-traumatic stress disorder)   . Sickle cell trait (HCC)   . Trauma     Past Surgical History:  Procedure Laterality Date  . DILATION AND CURETTAGE OF UTERUS  2008  . TOOTH EXTRACTION      Family History  Problem Relation Age of Onset  . Diabetes Father   . Hypertension Father   . Diabetes Maternal Grandmother   . Diabetes Maternal Grandfather   . Diabetes Paternal Grandmother   . Diabetes Paternal Grandfather   . Other Cousin     sickle cell disease  . Arthritis Other   . Asthma Other   . Early death Other     uncle-suicide  . Cataracts Other   . Congestive Heart Failure Other   . Hyperlipidemia Other   . Hypertension Other   . Stroke Other    . Migraines Other   . Anesthesia problems Neg Hx     Social History  Substance Use Topics  . Smoking status: Never Smoker  . Smokeless tobacco: Never Used  . Alcohol use No     Comment: occas. social  LAST  DRANK IN SEPT-2014    Allergies:  Allergies  Allergen Reactions  . Latex Hives and Rash    Prescriptions Prior to Admission  Medication Sig Dispense Refill Last Dose  . aspirin 81 MG chewable tablet Chew 81 mg by mouth daily.   04/30/2016 at Unknown time  . buPROPion (WELLBUTRIN SR) 150 MG 12 hr tablet Take 1 tablet (150 mg total) by mouth 2 (two) times daily. 60 tablet 2 12/13/2015 at Unknown time  . metroNIDAZOLE (FLAGYL) 500 MG tablet Take 1 tablet (500 mg total) by mouth 2 (two) times daily. 14 tablet 0   . Prenat-FeFum-FePo-FA-Omega 3 (CONCEPT DHA) 53.5-38-1 MG CAPS Take 1 tablet by mouth daily. 30 capsule 5 04/30/2016 at Unknown time    Review of Systems  Constitutional: Negative.   Eyes: Negative for visual disturbance.  Gastrointestinal: Negative.   Genitourinary: Negative.   Skin: Positive for rash.  Neurological: Negative for headaches.   Physical Exam   Blood  pressure 116/60, pulse 95, temperature 98.6 F (37 C), temperature source Oral, resp. rate 18, last menstrual period 10/25/2015, SpO2 100 %, currently breastfeeding.  Temp:  [98.5 F (36.9 C)-98.6 F (37 C)] 98.6 F (37 C) (04/24 1845) Pulse Rate:  [94-114] 95 (04/24 1845) Resp:  [18] 18 (04/24 1901) BP: (115-151)/(60-86) 116/60 (04/24 1845) SpO2:  [99 %-100 %] 100 % (04/24 1845)   Physical Exam  Nursing note and vitals reviewed. Constitutional: She is oriented to person, place, and time. She appears well-developed and well-nourished. No distress.  HENT:  Head: Normocephalic and atraumatic.  Eyes: Conjunctivae are normal. Right eye exhibits no discharge. Left eye exhibits no discharge. No scleral icterus.  Neck: Normal range of motion.  Cardiovascular: Normal rate, regular rhythm and normal  heart sounds.   No murmur heard. Respiratory: Effort normal and breath sounds normal. No respiratory distress. She has no wheezes.  GI: Soft.  Neurological: She is alert and oriented to person, place, and time. She has normal reflexes.  No clonus  Skin: Skin is warm and dry. Rash noted. She is not diaphoretic.  Hyperpigmented macular rash in bilateral groin  Psychiatric: She has a normal mood and affect. Her behavior is normal. Judgment and thought content normal.   Fetal Tracing:  Baseline: 145 Variability: moderate Accelerations: 15x15 Decelerations: none  Toco: none MAU Course  Procedures Results for orders placed or performed during the hospital encounter of 05/29/16 (from the past 24 hour(s))  Protein / creatinine ratio, urine     Status: Abnormal   Collection Time: 05/29/16  5:35 PM  Result Value Ref Range   Creatinine, Urine 331.00 mg/dL   Total Protein, Urine 55 mg/dL   Protein Creatinine Ratio 0.17 (H) 0.00 - 0.15 mg/mg[Cre]  Urinalysis, Routine w reflex microscopic     Status: Abnormal   Collection Time: 05/29/16  5:35 PM  Result Value Ref Range   Color, Urine YELLOW YELLOW   APPearance HAZY (A) CLEAR   Specific Gravity, Urine 1.029 1.005 - 1.030   pH 6.0 5.0 - 8.0   Glucose, UA NEGATIVE NEGATIVE mg/dL   Hgb urine dipstick SMALL (A) NEGATIVE   Bilirubin Urine NEGATIVE NEGATIVE   Ketones, ur 5 (A) NEGATIVE mg/dL   Protein, ur 161 (A) NEGATIVE mg/dL   Nitrite NEGATIVE NEGATIVE   Leukocytes, UA SMALL (A) NEGATIVE   RBC / HPF TOO NUMEROUS TO COUNT 0 - 5 RBC/hpf   WBC, UA 0-5 0 - 5 WBC/hpf   Bacteria, UA FEW (A) NONE SEEN   Squamous Epithelial / LPF 0-5 (A) NONE SEEN   Mucous PRESENT   CBC     Status: Abnormal   Collection Time: 05/29/16  5:42 PM  Result Value Ref Range   WBC 7.3 4.0 - 10.5 K/uL   RBC 4.78 3.87 - 5.11 MIL/uL   Hemoglobin 10.2 (L) 12.0 - 15.0 g/dL   HCT 09.6 (L) 04.5 - 40.9 %   MCV 66.9 (L) 78.0 - 100.0 fL   MCH 21.3 (L) 26.0 - 34.0 pg    MCHC 31.9 30.0 - 36.0 g/dL   RDW 81.1 (H) 91.4 - 78.2 %   Platelets 199 150 - 400 K/uL  Comprehensive metabolic panel     Status: Abnormal   Collection Time: 05/29/16  5:42 PM  Result Value Ref Range   Sodium 134 (L) 135 - 145 mmol/L   Potassium 3.7 3.5 - 5.1 mmol/L   Chloride 106 101 - 111 mmol/L   CO2 21 (L) 22 -  32 mmol/L   Glucose, Bld 108 (H) 65 - 99 mg/dL   BUN 6 6 - 20 mg/dL   Creatinine, Ser 1.61 (L) 0.44 - 1.00 mg/dL   Calcium 8.8 (L) 8.9 - 10.3 mg/dL   Total Protein 6.9 6.5 - 8.1 g/dL   Albumin 3.2 (L) 3.5 - 5.0 g/dL   AST 16 15 - 41 U/L   ALT 9 (L) 14 - 54 U/L   Alkaline Phosphatase 52 38 - 126 U/L   Total Bilirubin 0.4 0.3 - 1.2 mg/dL   GFR calc non Af Amer >60 >60 mL/min   GFR calc Af Amer >60 >60 mL/min   Anion gap 7 5 - 15    MDM Elevated BP; none severe range CBC, CMP, urine PCR Reactive fetal tracing Discussed patient with Dr. Adrian Blackwater. Will have f/u at [facility] on Thursday for BP check  Assessment and Plan  A: 1. Intertriginous candidiasis   2. Elevated blood pressure reading without diagnosis of hypertension    P: Discharge home Rx nystatin & triamcinolone Discussed reasons to return to MAU including s/s of preeclampsia Pt states she can't go to clinic at 11 am on Thursday -- msg to clinic for RN visit later in the afternoon for Thursday Reschedule appointment with [facility]  Judeth Horn 05/29/2016, 5:33 PM

## 2016-05-29 NOTE — Discharge Instructions (Signed)
Hypertension During Pregnancy °Hypertension, commonly called high blood pressure, is when the force of blood pumping through your arteries is too strong. Arteries are blood vessels that carry blood from the heart throughout the body. Hypertension during pregnancy can cause problems for you and your baby. Your baby may be born early (prematurely) or may not weigh as much as he or she should at birth. Very bad cases of hypertension during pregnancy can be life-threatening. °Different types of hypertension can occur during pregnancy. These include: °· Chronic hypertension. This happens when: °¨ You have hypertension before pregnancy and it continues during pregnancy. °¨ You develop hypertension before you are [redacted] weeks pregnant, and it continues during pregnancy. °· Gestational hypertension. This is hypertension that develops after the 20th week of pregnancy. °· Preeclampsia, also called toxemia of pregnancy. This is a very serious type of hypertension that develops only during pregnancy. It affects the whole body, and it can be very dangerous for you and your baby. °Gestational hypertension and preeclampsia usually go away within 6 weeks after your baby is born. Women who have hypertension during pregnancy have a greater chance of developing hypertension later in life or during future pregnancies. °What are the causes? °The exact cause of hypertension is not known. °What increases the risk? °There are certain factors that make it more likely for you to develop hypertension during pregnancy. These include: °· Having hypertension during a previous pregnancy or prior to pregnancy. °· Being overweight. °· Being older than age 40. °· Being pregnant for the first time or being pregnant with more than one baby. °· Becoming pregnant using fertilization methods such as IVF (in vitro fertilization). °· Having diabetes, kidney problems, or systemic lupus erythematosus. °· Having a family history of hypertension. °What are the  signs or symptoms? °Chronic hypertension and gestational hypertension rarely cause symptoms. Preeclampsia causes symptoms, which may include: °· Increased protein in your urine. Your health care provider will check for this at every visit before you give birth (prenatal visit). °· Severe headaches. °· Sudden weight gain. °· Swelling of the hands, face, legs, and feet. °· Nausea and vomiting. °· Vision problems, such as blurred or double vision. °· Numbness in the face, arms, legs, and feet. °· Dizziness. °· Slurred speech. °· Sensitivity to bright lights. °· Abdominal pain. °· Convulsions. °How is this diagnosed? °You may be diagnosed with hypertension during a routine prenatal exam. At each prenatal visit, you may: °· Have a urine test to check for high amounts of protein in your urine. °· Have your blood pressure checked. A blood pressure reading is recorded as two numbers, such as "120 over 80" (or 120/80). The first ("top") number is called the systolic pressure. It is a measure of the pressure in your arteries when your heart beats. The second ("bottom") number is called the diastolic pressure. It is a measure of the pressure in your arteries as your heart relaxes between beats. Blood pressure is measured in a unit called mm Hg. A normal blood pressure reading is: °¨ Systolic: below 120. °¨ Diastolic: below 80. °The type of hypertension that you are diagnosed with depends on your test results and when your symptoms developed. °· Chronic hypertension is usually diagnosed before 20 weeks of pregnancy. °· Gestational hypertension is usually diagnosed after 20 weeks of pregnancy. °· Hypertension with high amounts of protein in the urine is diagnosed as preeclampsia. °· Blood pressure measurements that stay above 160 systolic, or above 110 diastolic, are signs of severe preeclampsia. °  How is this treated? Treatment for hypertension during pregnancy varies depending on the type of hypertension you have and how  serious it is.  If you take medicines called ACE inhibitors to treat chronic hypertension, you may need to switch medicines. ACE inhibitors should not be taken during pregnancy.  If you have gestational hypertension, you may need to take blood pressure medicine.  If you are at risk for preeclampsia, your health care provider may recommend that you take a low-dose aspirin every day to prevent high blood pressure during your pregnancy.  If you have severe preeclampsia, you may need to be hospitalized so you and your baby can be monitored closely. You may also need to take medicine (magnesium sulfate) to prevent seizures and to lower blood pressure. This medicine may be given as an injection or through an IV tube.  In some cases, if your condition gets worse, you may need to deliver your baby early. Follow these instructions at home: Eating and drinking   Drink enough fluid to keep your urine clear or pale yellow.  Eat a healthy diet that is low in salt (sodium). Do not add salt to your food. Check food labels to see how much sodium a food or beverage contains. Lifestyle   Do not use any products that contain nicotine or tobacco, such as cigarettes and e-cigarettes. If you need help quitting, ask your health care provider.  Do not use alcohol.  Avoid caffeine.  Avoid stress as much as possible. Rest and get plenty of sleep. General instructions   Take over-the-counter and prescription medicines only as told by your health care provider.  While lying down, lie on your left side. This keeps pressure off your baby.  While sitting or lying down, raise (elevate) your feet. Try putting some pillows under your lower legs.  Exercise regularly. Ask your health care provider what kinds of exercise are best for you.  Keep all prenatal and follow-up visits as told by your health care provider. This is important. Contact a health care provider if:  You have symptoms that your health care  provider told you may require more treatment or monitoring, such as:  Fever.  Vomiting.  Headache. Get help right away if:  You have severe abdominal pain or vomiting that does not get better with treatment.  You suddenly develop swelling in your hands, ankles, or face.  You gain 4 lbs (1.8 kg) or more in 1 week.  You develop vaginal bleeding, or you have blood in your urine.  You do not feel your baby moving as much as usual.  You have blurred or double vision.  You have muscle twitching or sudden tightening (spasms).  You have shortness of breath.  Your lips or fingernails turn blue. This information is not intended to replace advice given to you by your health care provider. Make sure you discuss any questions you have with your health care provider. Document Released: 10/10/2010 Document Revised: 08/12/2015 Document Reviewed: 07/08/2015 Elsevier Interactive Patient Education  2017 [employer]     Intertrigo Intertrigo is skin irritation (inflammation) that happens in warm, moist areas of the body. The irritation can cause a rash and make skin raw and itchy. The rash is usually pink or red. It happens mostly between folds of skin or where skin rubs together, such as:  Toes.  Armpits.  Groin.  Belly.  Breasts.  Buttocks. This condition is not passed from person to person (is not contagious). Follow these instructions at  home:  Keep the affected area clean and dry.  Do not scratch your skin.  Stay cool as much as possible. Use an air conditioner or fan, if you can.  Apply over-the-counter and prescription medicines only as told by your doctor.  If you were prescribed an antibiotic medicine, use it as told by your doctor. Do not stop using the antibiotic even if your condition starts to get better.  Keep all follow-up visits as told by your doctor. This is important. How is this prevented?  Stay at a healthy weight.  Keep your feet dry. This is very  important if you have diabetes. Wear cotton or wool socks.  Take care of and protect the skin in your groin and butt area as told by your doctor.  Do not wear tight clothes. Wear clothes that:  Are loose.  Take away moisture from your body.  Are made of cotton.  Wear a bra that gives good support, if needed.  Shower and dry yourself fully after being active.  Keep your blood sugar under control if you have diabetes. Contact a doctor if:  Your symptoms do not get better with treatment.  Your symptoms get worse or they spread.  You notice more redness and warmth.  You have a fever. This information is not intended to replace advice given to you by your health care provider. Make sure you discuss any questions you have with your health care provider. Document Released: 02/24/2010 Document Revised: 06/30/2015 Document Reviewed: 07/26/2014 Elsevier Interactive Patient Education  2017 [employer].

## 2016-05-29 NOTE — MAU Note (Signed)
Pt was seen in MAU at the beginning of April and diagnosed with BV.  Treated with antibiotics and finished the course and discharge and itching is gone, but now having painful peeling rash on inner thighs and labia.  Denies LOF, vaginal bleeding, or discharge. Reports good fetal movement.

## 2016-05-30 ENCOUNTER — Telehealth: Payer: Self-pay | Admitting: General Practice

## 2016-05-31 ENCOUNTER — Ambulatory Visit: Payer: Self-pay

## 2016-06-07 ENCOUNTER — Encounter: Payer: Self-pay | Admitting: *Deleted

## 2016-07-24 ENCOUNTER — Other Ambulatory Visit: Payer: Self-pay | Admitting: Obstetrics & Gynecology

## 2016-07-24 ENCOUNTER — Other Ambulatory Visit (HOSPITAL_COMMUNITY): Payer: Self-pay | Admitting: Student

## 2016-07-30 ENCOUNTER — Encounter (HOSPITAL_COMMUNITY): Payer: Self-pay | Admitting: *Deleted

## 2016-07-30 ENCOUNTER — Inpatient Hospital Stay (HOSPITAL_COMMUNITY)
Admission: AD | Admit: 2016-07-30 | Discharge: 2016-07-30 | Disposition: A | Discharge status: Home / Self Care | Payer: Medicaid Other | Source: Ambulatory Visit | Attending: Obstetrics & Gynecology | Admitting: Obstetrics & Gynecology

## 2016-07-30 DIAGNOSIS — Z91040 Latex allergy status: Secondary | ICD-10-CM | POA: Insufficient documentation

## 2016-07-30 DIAGNOSIS — Z825 Family history of asthma and other chronic lower respiratory diseases: Secondary | ICD-10-CM | POA: Insufficient documentation

## 2016-07-30 DIAGNOSIS — O368130 Decreased fetal movements, third trimester, not applicable or unspecified: Secondary | ICD-10-CM | POA: Diagnosis not present

## 2016-07-30 DIAGNOSIS — R51 Headache: Secondary | ICD-10-CM

## 2016-07-30 DIAGNOSIS — O99013 Anemia complicating pregnancy, third trimester: Secondary | ICD-10-CM

## 2016-07-30 DIAGNOSIS — Z823 Family history of stroke: Secondary | ICD-10-CM | POA: Insufficient documentation

## 2016-07-30 DIAGNOSIS — O26893 Other specified pregnancy related conditions, third trimester: Secondary | ICD-10-CM | POA: Diagnosis not present

## 2016-07-30 DIAGNOSIS — E039 Hypothyroidism, unspecified: Secondary | ICD-10-CM | POA: Insufficient documentation

## 2016-07-30 DIAGNOSIS — O99343 Other mental disorders complicating pregnancy, third trimester: Secondary | ICD-10-CM | POA: Insufficient documentation

## 2016-07-30 DIAGNOSIS — Z8261 Family history of arthritis: Secondary | ICD-10-CM | POA: Diagnosis not present

## 2016-07-30 DIAGNOSIS — Z3A37 37 weeks gestation of pregnancy: Secondary | ICD-10-CM | POA: Diagnosis not present

## 2016-07-30 DIAGNOSIS — O99283 Endocrine, nutritional and metabolic diseases complicating pregnancy, third trimester: Secondary | ICD-10-CM | POA: Insufficient documentation

## 2016-07-30 DIAGNOSIS — Z833 Family history of diabetes mellitus: Secondary | ICD-10-CM | POA: Diagnosis not present

## 2016-07-30 DIAGNOSIS — Z9889 Other specified postprocedural states: Secondary | ICD-10-CM | POA: Insufficient documentation

## 2016-07-30 DIAGNOSIS — O163 Unspecified maternal hypertension, third trimester: Secondary | ICD-10-CM | POA: Insufficient documentation

## 2016-07-30 DIAGNOSIS — Z7982 Long term (current) use of aspirin: Secondary | ICD-10-CM | POA: Insufficient documentation

## 2016-07-30 DIAGNOSIS — F4310 Post-traumatic stress disorder, unspecified: Secondary | ICD-10-CM | POA: Insufficient documentation

## 2016-07-30 DIAGNOSIS — Z8249 Family history of ischemic heart disease and other diseases of the circulatory system: Secondary | ICD-10-CM | POA: Insufficient documentation

## 2016-07-30 DIAGNOSIS — Z3689 Encounter for other specified antenatal screening: Secondary | ICD-10-CM

## 2016-07-30 DIAGNOSIS — R519 Headache, unspecified: Secondary | ICD-10-CM

## 2016-07-30 DIAGNOSIS — Z832 Family history of diseases of the blood and blood-forming organs and certain disorders involving the immune mechanism: Secondary | ICD-10-CM | POA: Insufficient documentation

## 2016-07-30 LAB — CBC
HCT: 29.8 % — ABNORMAL LOW (ref 36.0–46.0)
HEMOGLOBIN: 9.5 g/dL — AB (ref 12.0–15.0)
MCH: 20.8 pg — ABNORMAL LOW (ref 26.0–34.0)
MCHC: 31.9 g/dL (ref 30.0–36.0)
MCV: 65.2 fL — ABNORMAL LOW (ref 78.0–100.0)
PLATELETS: 161 10*3/uL (ref 150–400)
RBC: 4.57 MIL/uL (ref 3.87–5.11)
RDW: 17.9 % — ABNORMAL HIGH (ref 11.5–15.5)
WBC: 5.9 10*3/uL (ref 4.0–10.5)

## 2016-07-30 LAB — TYPE AND SCREEN
ABO/RH(D): O POS
ANTIBODY SCREEN: NEGATIVE

## 2016-07-30 LAB — URINALYSIS, ROUTINE W REFLEX MICROSCOPIC
Bilirubin Urine: NEGATIVE
GLUCOSE, UA: NEGATIVE mg/dL
HGB URINE DIPSTICK: NEGATIVE
Ketones, ur: NEGATIVE mg/dL
Nitrite: NEGATIVE
PROTEIN: 30 mg/dL — AB
Specific Gravity, Urine: 1.021 (ref 1.005–1.030)
pH: 5 (ref 5.0–8.0)

## 2016-07-30 LAB — COMPREHENSIVE METABOLIC PANEL
ALT: 7 U/L — ABNORMAL LOW (ref 14–54)
ANION GAP: 9 (ref 5–15)
AST: 17 U/L (ref 15–41)
Albumin: 2.8 g/dL — ABNORMAL LOW (ref 3.5–5.0)
Alkaline Phosphatase: 85 U/L (ref 38–126)
BILIRUBIN TOTAL: 0.2 mg/dL — AB (ref 0.3–1.2)
BUN: 5 mg/dL — AB (ref 6–20)
CHLORIDE: 105 mmol/L (ref 101–111)
CO2: 19 mmol/L — AB (ref 22–32)
Calcium: 8.4 mg/dL — ABNORMAL LOW (ref 8.9–10.3)
Creatinine, Ser: 0.37 mg/dL — ABNORMAL LOW (ref 0.44–1.00)
GFR calc Af Amer: >60 mL/min (ref >60)
GFR calc non Af Amer: >60 mL/min (ref >60)
GLUCOSE: 95 mg/dL (ref 65–99)
POTASSIUM: 3.4 mmol/L — AB (ref 3.5–5.1)
SODIUM: 133 mmol/L — AB (ref 135–145)
Total Protein: 6.4 g/dL — ABNORMAL LOW (ref 6.5–8.1)

## 2016-07-30 LAB — GC/CHLAMYDIA PROBE AMP (~~LOC~~) NOT AT ARMC
CHLAMYDIA, DNA PROBE: NEGATIVE
NEISSERIA GONORRHEA: NEGATIVE

## 2016-07-30 LAB — RAPID URINE DRUG SCREEN, HOSP PERFORMED
AMPHETAMINES: NOT DETECTED
BARBITURATES: NOT DETECTED
Benzodiazepines: NOT DETECTED
Cocaine: NOT DETECTED
Opiates: NOT DETECTED
Tetrahydrocannabinol: NOT DETECTED

## 2016-07-30 LAB — DIFFERENTIAL
BASOS ABS: 0 10*3/uL (ref 0.0–0.1)
Basophils Relative: 0 %
EOS ABS: 0 10*3/uL (ref 0.0–0.7)
Eosinophils Relative: 1 %
LYMPHS ABS: 1.9 10*3/uL (ref 0.7–4.0)
Lymphocytes Relative: 32 %
Monocytes Absolute: 0.3 10*3/uL (ref 0.1–1.0)
Monocytes Relative: 6 %
NEUTROS PCT: 62 %
Neutro Abs: 3.6 10*3/uL (ref 1.7–7.7)

## 2016-07-30 LAB — HIV ANTIBODY (ROUTINE TESTING W REFLEX): HIV Screen 4th Generation wRfx: NONREACTIVE

## 2016-07-30 LAB — WET PREP, GENITAL
Clue Cells Wet Prep HPF POC: NONE SEEN
SPERM: NONE SEEN
TRICH WET PREP: NONE SEEN
Yeast Wet Prep HPF POC: NONE SEEN

## 2016-07-30 LAB — OB RESULTS CONSOLE GC/CHLAMYDIA: GC PROBE AMP, GENITAL: NEGATIVE

## 2016-07-30 LAB — OB RESULTS CONSOLE GBS: GBS: NEGATIVE

## 2016-07-30 LAB — HEPATITIS B SURFACE ANTIGEN: HEP B S AG: NEGATIVE

## 2016-07-30 LAB — RPR: RPR: NONREACTIVE

## 2016-07-30 MED ORDER — FERROUS SULFATE 325 (65 FE) MG PO TABS: 325.0000 mg | ORAL_TABLET | Freq: Every day | ORAL | 0 refills | Status: DC

## 2016-07-30 MED ORDER — ACETAMINOPHEN 500 MG PO TABS
1000.0000 mg | ORAL_TABLET | Freq: Once | ORAL | Status: AC
  Administered 2016-07-30: 1000 mg via ORAL
  Filled 2016-07-30: qty 2

## 2016-07-30 NOTE — MAU Provider Note (Signed)
History     CSN: [id number]  Arrival date and time: 07/30/16 7510   First Provider Initiated Contact with Patient 07/30/16 0740      Chief Complaint  Patient presents with  . Headache   Danielle [name] is a 31 y.o. [id number] at 50w6dwho presents today with a headache. She reports that in the past when she has had a headache her blood pressure has been elevated. She also reports decreased fetal movement, but since arriving here she has felt normal fetal movement. She denies any contractions, leaking of fluid or vaginal bleeding. Patient has not had any prenatal care. She has had history of pre-eclampsia with her last pregnancy.    Headache   This is a new problem. The current episode started today. The problem occurs constantly. The problem has been unchanged. The pain is located in the frontal region. The pain does not radiate. The pain quality is similar to prior headaches. The pain is at a severity of 8/10. Associated symptoms include nausea and vomiting (x1 yesterday ). Pertinent negatives include no fever. Nothing aggravates the symptoms. She has tried nothing for the symptoms.   Past Medical History:  Diagnosis Date  . Anxiety   . Depression   . Dizzy spells   . Endometriosis   . Headache(784.0)   . Hypertension    PIH  . Hypothyroidism   . Pregnancy induced hypertension 2016  . PTSD (post-traumatic stress disorder)   . Sickle cell trait ([place]   . Trauma     Past Surgical History:  Procedure Laterality Date  . DILATION AND CURETTAGE OF UTERUS  2008  . TOOTH EXTRACTION      Family History  Problem Relation Age of Onset  . Diabetes Father   . Hypertension Father   . Diabetes Maternal Grandmother   . Diabetes Maternal Grandfather   . Diabetes Paternal Grandmother   . Diabetes Paternal Grandfather   . Other Cousin        sickle cell disease  . Arthritis Other   . Asthma Other   . Early death Other        uncle-suicide  . Cataracts Other   . Congestive Heart  Failure Other   . Hyperlipidemia Other   . Hypertension Other   . Stroke Other   . Migraines Other   . Anesthesia problems Neg Hx     Social History  Substance Use Topics  . Smoking status: Never Smoker  . Smokeless tobacco: Never Used  . Alcohol use No     Comment: occas. social  LAST  DRANK IN SEPT-2014    Allergies:  Allergies  Allergen Reactions  . Latex Hives and Rash    Prescriptions Prior to Admission  Medication Sig Dispense Refill Last Dose  . aspirin 81 MG chewable tablet Chew 81 mg by mouth daily.   07/29/2016 at Unknown time  . Prenat-FeFum-FePo-FA-Omega 3 (CONCEPT DHA) 53.5-38-1 MG CAPS Take 1 tablet by mouth daily. 30 capsule 5 Past Month at Unknown time  . buPROPion (WELLBUTRIN SR) 150 MG 12 hr tablet Take 1 tablet (150 mg total) by mouth 2 (two) times daily. 60 tablet 2 12/13/2015 at Unknown time  . nystatin cream (MYCOSTATIN) Apply to affected area 2 times daily 30 g 0 Unknown at Unknown time  . triamcinolone cream (KENALOG) 0.1 % Apply 1 application topically 2 (two) times daily. 30 g 0 Unknown at Unknown time  . valACYclovir (VALTREX) 500 MG tablet TAKE ONE TABLET BY MOUTH TWO TIMES  DAILY. (BLUE OBLONG TAB) 30 tablet 2 Unknown at Unknown time   Results for orders placed or performed during the hospital encounter of 07/30/16 (from the past 48 hour(s))  Urinalysis, Routine w reflex microscopic     Status: Abnormal   Collection Time: 07/30/16  6:36 AM  Result Value Ref Range   Color, Urine YELLOW YELLOW   APPearance CLOUDY (A) CLEAR   Specific Gravity, Urine 1.021 1.005 - 1.030   pH 5.0 5.0 - 8.0   Glucose, UA NEGATIVE NEGATIVE mg/dL   Hgb urine dipstick NEGATIVE NEGATIVE   Bilirubin Urine NEGATIVE NEGATIVE   Ketones, ur NEGATIVE NEGATIVE mg/dL   Protein, ur 30 (A) NEGATIVE mg/dL   Nitrite NEGATIVE NEGATIVE   Leukocytes, UA MODERATE (A) NEGATIVE   RBC / HPF 0-5 0 - 5 RBC/hpf   WBC, UA 6-30 0 - 5 WBC/hpf   Bacteria, UA RARE (A) NONE SEEN   Squamous  Epithelial / LPF 6-30 (A) NONE SEEN   Mucous PRESENT   Urine rapid drug screen (hosp performed)     Status: None   Collection Time: 07/30/16  6:36 AM  Result Value Ref Range   Opiates NONE DETECTED NONE DETECTED   Cocaine NONE DETECTED NONE DETECTED   Benzodiazepines NONE DETECTED NONE DETECTED   Amphetamines NONE DETECTED NONE DETECTED   Tetrahydrocannabinol NONE DETECTED NONE DETECTED   Barbiturates NONE DETECTED NONE DETECTED    Comment:        DRUG SCREEN FOR MEDICAL PURPOSES ONLY.  IF CONFIRMATION IS NEEDED FOR ANY PURPOSE, NOTIFY LAB WITHIN 5 DAYS.        LOWEST DETECTABLE LIMITS FOR URINE DRUG SCREEN Drug Class       Cutoff (ng/mL) Amphetamine      1000 Barbiturate      200 Benzodiazepine   992 Tricyclics       426 Opiates          300 Cocaine          300 THC              50   Wet prep, genital     Status: Abnormal   Collection Time: 07/30/16  7:25 AM  Result Value Ref Range   Yeast Wet Prep HPF POC NONE SEEN NONE SEEN   Trich, Wet Prep NONE SEEN NONE SEEN   Clue Cells Wet Prep HPF POC NONE SEEN NONE SEEN   WBC, Wet Prep HPF POC FEW (A) NONE SEEN    Comment: FEW BACTERIA SEEN   Sperm NONE SEEN   Hepatitis B surface antigen     Status: None   Collection Time: 07/30/16  7:42 AM  Result Value Ref Range   Hepatitis B Surface Ag Negative Negative    Comment: (NOTE) Performed At: [facility] [address] [place], [place] [id number] Lindon Romp MD [id number]   RPR     Status: None   Collection Time: 07/30/16  7:42 AM  Result Value Ref Range   RPR Ser Ql Non Reactive Non Reactive    Comment: (NOTE) Performed At: [facility] [place], [place] [id number] Lindon Romp MD [id number]   CBC     Status: Abnormal   Collection Time: 07/30/16  7:42 AM  Result Value Ref Range   WBC 5.9 4.0 - 10.5 K/uL   RBC 4.57 3.87 - 5.11 MIL/uL   Hemoglobin 9.5 (L) 12.0 - 15.0 g/dL   HCT 29.8 (L) 36.0 - 46.0 %  MCV 65.2 (L) 78.0 -  100.0 fL   MCH 20.8 (L) 26.0 - 34.0 pg   MCHC 31.9 30.0 - 36.0 g/dL   RDW 17.9 (H) 11.5 - 15.5 %   Platelets 161 150 - 400 K/uL  Differential     Status: None   Collection Time: 07/30/16  7:42 AM  Result Value Ref Range   Neutrophils Relative % 62 %   Neutro Abs 3.6 1.7 - 7.7 K/uL   Lymphocytes Relative 32 %   Lymphs Abs 1.9 0.7 - 4.0 K/uL   Monocytes Relative 6 %   Monocytes Absolute 0.3 0.1 - 1.0 K/uL   Eosinophils Relative 1 %   Eosinophils Absolute 0.0 0.0 - 0.7 K/uL   Basophils Relative 0 %   Basophils Absolute 0.0 0.0 - 0.1 K/uL  Comprehensive metabolic panel     Status: Abnormal   Collection Time: 07/30/16  7:42 AM  Result Value Ref Range   Sodium 133 (L) 135 - 145 mmol/L   Potassium 3.4 (L) 3.5 - 5.1 mmol/L   Chloride 105 101 - 111 mmol/L   CO2 19 (L) 22 - 32 mmol/L   Glucose, Bld 95 65 - 99 mg/dL   BUN 5 (L) 6 - 20 mg/dL   Creatinine, Ser 0.37 (L) 0.44 - 1.00 mg/dL   Calcium 8.4 (L) 8.9 - 10.3 mg/dL   Total Protein 6.4 (L) 6.5 - 8.1 g/dL   Albumin 2.8 (L) 3.5 - 5.0 g/dL   AST 17 15 - 41 U/L   ALT 7 (L) 14 - 54 U/L   Alkaline Phosphatase 85 38 - 126 U/L   Total Bilirubin 0.2 (L) 0.3 - 1.2 mg/dL   GFR calc non Af Amer >60 >60 mL/min   GFR calc Af Amer >60 >60 mL/min    Comment: (NOTE) The eGFR has been calculated using the CKD EPI equation. This calculation has not been validated in all clinical situations. eGFR's persistently <60 mL/min signify possible Chronic Kidney Disease.    Anion gap 9 5 - 15  Type and screen [place]     Status: None   Collection Time: 07/30/16  7:52 AM  Result Value Ref Range   ABO/RH(D) O POS    Antibody Screen NEG    Sample Expiration 08/02/2016     Review of Systems  Constitutional: Negative for chills and fever.  Gastrointestinal: Positive for nausea and vomiting (x1 yesterday ).  Genitourinary: Negative for dysuria, frequency, urgency, vaginal bleeding and vaginal discharge.  Neurological: Positive for  headaches.   Physical Exam   Blood pressure 134/69, pulse (!) 107, last menstrual period 10/25/2015, currently breastfeeding.  Physical Exam  Nursing note and vitals reviewed. Constitutional: She is oriented to person, place, and time. She appears well-developed and well-nourished. No distress.  HENT:  Head: Normocephalic.  Cardiovascular: Normal rate.   Respiratory: Effort normal.  Neurological: She is alert and oriented to person, place, and time.  Skin: Skin is warm and dry.  Psychiatric: She has a normal mood and affect.    FHT: 155, moderate with 15x15 accels, no decels Toco: no UCs  MAU Course  Procedures  MDM 0800 Care turned over to J. Kayvan Hoefling, NP CBC, CMET pending, patient given tylenol for her headache Marcille Buffy 8:04 AM 07/30/16   Assessment and Plan   A:  1. Headache in pregnancy, antepartum, third trimester   2. Decreased fetal movements in third trimester, single or unspecified fetus   3. NST (non-stress test)  reactive   4. Anemia during pregnancy in third trimester     P:  Discharge home in stable condition Patient to call the [place] office to schedule an appointment before delivery Reactive NST today Return to MAU if symptoms worsen Ok to use tylenol as directed on the bottle.  Rx: Iron   Noni Saupe I, NP 07/30/2016 1:50 PM

## 2016-07-30 NOTE — Discharge Instructions (Signed)
Prenatal Care WHAT IS PRENATAL CARE? Prenatal care is the process of caring for a pregnant woman before she gives birth. Prenatal care makes sure that she and her baby remain as healthy as possible throughout pregnancy. Prenatal care may be provided by a midwife, family practice health care provider, or a childbirth and pregnancy specialist (obstetrician). Prenatal care may include physical examinations, testing, treatments, and education on nutrition, lifestyle, and social support services. WHY IS PRENATAL CARE SO IMPORTANT? Early and consistent prenatal care increases the chance that you and your baby will remain healthy throughout your pregnancy. This type of care also decreases a babys risk of being born too early (prematurely), or being born smaller than expected (small for gestational age). Any underlying medical conditions you may have that could pose a risk during your pregnancy are discussed during prenatal care visits. You will also be monitored regularly for any new conditions that may arise during your pregnancy so they can be treated quickly and effectively. WHAT HAPPENS DURING PRENATAL CARE VISITS? Prenatal care visits may include the following: Discussion Tell your health care provider about any new signs or symptoms you have experienced since your last visit. These might include:  Nausea or vomiting.  Increased or decreased level of energy.  Difficulty sleeping.  Back or leg pain.  Weight changes.  Frequent urination.  Shortness of breath with physical activity.  Changes in your skin, such as the development of a rash or itchiness.  Vaginal discharge or bleeding.  Feelings of excitement or nervousness.  Changes in your babys movements.  You may want to write down any questions or topics you want to discuss with your health care provider and bring them with you to your appointment. Examination During your first prenatal care visit, you will likely have a complete  physical exam. Your health care provider will often examine your vagina, cervix, and the position of your uterus, as well as check your heart, lungs, and other body systems. As your pregnancy progresses, your health care provider will measure the size of your uterus and your babys position inside your uterus. He or she may also examine you for early signs of labor. Your prenatal visits may also include checking your blood pressure and, after about 10-12 weeks of pregnancy, listening to your babys heartbeat. Testing Regular testing often includes:  Urinalysis. This checks your urine for glucose, protein, or signs of infection.  Blood count. This checks the levels of white and red blood cells in your body.  Tests for sexually transmitted infections (STIs). Testing for STIs at the beginning of pregnancy is routinely done and is required in many states.  Antibody testing. You will be checked to see if you are immune to certain illnesses, such as rubella, that can affect a developing fetus.  Glucose screen. Around 24-28 weeks of pregnancy, your blood glucose level will be checked for signs of gestational diabetes. Follow-up tests may be recommended.  Group B strep. This is a bacteria that is commonly found inside a womans vagina. This test will inform your health care provider if you need an antibiotic to reduce the amount of this bacteria in your body prior to labor and childbirth.  Ultrasound. Many pregnant women undergo an ultrasound screening around 18-20 weeks of pregnancy to evaluate the health of the fetus and check for any developmental abnormalities.  HIV (human immunodeficiency virus) testing. Early in your pregnancy, you will be screened for HIV. If you are at high risk for HIV, this test may  be repeated during your third trimester of pregnancy.  You may be offered other testing based on your age, personal or family medical history, or other factors. HOW OFTEN SHOULD I PLAN TO SEE MY  HEALTH CARE PROVIDER FOR PRENATAL CARE? Your prenatal care check-up schedule depends on any medical conditions you have before, or develop during, your pregnancy. If you do not have any underlying medical conditions, you will likely be seen for checkups:  Monthly, during the first 6 months of pregnancy.  Twice a month during months 7 and 8 of pregnancy.  Weekly starting in the 9th month of pregnancy and until delivery.  If you develop signs of early labor or other concerning signs or symptoms, you may need to see your health care provider more often. Ask your health care provider what prenatal care schedule is best for you. WHAT CAN I DO TO KEEP MYSELF AND MY BABY AS HEALTHY AS POSSIBLE DURING MY PREGNANCY?  Take a prenatal vitamin containing 400 micrograms (0.4 mg) of folic acid every day. Your health care provider may also ask you to take additional vitamins such as iodine, vitamin D, iron, copper, and zinc.  Take 1500-2000 mg of calcium daily starting at your 20th week of pregnancy until you deliver your baby.  Make sure you are up to date on your vaccinations. Unless directed otherwise by your health care provider: ? You should receive a tetanus, diphtheria, and pertussis (Tdap) vaccination between the 27th and 36th week of your pregnancy, regardless of when your last Tdap immunization occurred. This helps protect your baby from whooping cough (pertussis) after he or she is born. ? You should receive an annual inactivated influenza vaccine (IIV) to help protect you and your baby from influenza. This can be done at any point during your pregnancy.  Eat a well-rounded diet that includes: ? Fresh fruits and vegetables. ? Lean proteins. ? Calcium-rich foods such as milk, yogurt, hard cheeses, and dark, leafy greens. ? Whole grain breads.  Do noteat seafood high in mercury, including: ? Swordfish. ? Tilefish. ? Shark. ? King mackerel. ? More than 6 oz tuna per week.  Do not  eat: ? Raw or undercooked meats or eggs. ? Unpasteurized foods, such as soft cheeses (brie, blue, or feta), juices, and milks. ? Lunch meats. ? Hot dogs that have not been heated until they are steaming.  Drink enough water to keep your urine clear or pale yellow. For many women, this may be 10 or more 8 oz glasses of water each day. Keeping yourself hydrated helps deliver nutrients to your baby and may prevent the start of pre-term uterine contractions.  Do not use any tobacco products including cigarettes, chewing tobacco, or electronic cigarettes. If you need help quitting, ask your health care provider.  Do not drink beverages containing alcohol. No safe level of alcohol consumption during pregnancy has been determined.  Do not use any illegal drugs. These can harm your developing baby or cause a miscarriage.  Ask your health care provider or pharmacist before taking any prescription or over-the-counter medicines, herbs, or supplements.  Limit your caffeine intake to no more than 200 mg per day.  Exercise. Unless told otherwise by your health care provider, try to get 30 minutes of moderate exercise most days of the week. Do not  do high-impact activities, contact sports, or activities with a high risk of falling, such as horseback riding or downhill skiing.  Get plenty of rest.  Avoid anything that raises your  body temperature, such as hot tubs and saunas.  If you own a cat, do not empty its litter box. Bacteria contained in cat feces can cause an infection called toxoplasmosis. This can result in serious harm to the fetus.  Stay away from chemicals such as insecticides, lead, mercury, and cleaning or paint products that contain solvents.  Do not have any X-rays taken unless medically necessary.  Take a childbirth and breastfeeding preparation class. Ask your health care provider if you need a referral or recommendation.  This information is not intended to replace advice given  to you by your health care provider. Make sure you discuss any questions you have with your health care provider. Document Released: 01/25/2003 Document Revised: 06/27/2015 Document Reviewed: 04/08/2013 Elsevier Interactive Patient Education  2017 [employer] Anemia, Nonspecific Anemia is a condition in which the concentration of red blood cells or hemoglobin in the blood is below normal. Hemoglobin is a substance in red blood cells that carries oxygen to the tissues of the body. Anemia results in not enough oxygen reaching these tissues. What are the causes? Common causes of anemia include:  Excessive bleeding. Bleeding may be internal or external. This includes excessive bleeding from periods (in women) or from the intestine.  Poor nutrition.  Chronic kidney, thyroid, and liver disease.  Bone marrow disorders that decrease red blood cell production.  Cancer and treatments for cancer.  HIV, AIDS, and their treatments.  Spleen problems that increase red blood cell destruction.  Blood disorders.  Excess destruction of red blood cells due to infection, medicines, and autoimmune disorders.  What are the signs or symptoms?  Minor weakness.  Dizziness.  Headache.  Palpitations.  Shortness of breath, especially with exercise.  Paleness.  Cold sensitivity.  Indigestion.  Nausea.  Difficulty sleeping.  Difficulty concentrating. Symptoms may occur suddenly or they may develop slowly. How is this diagnosed? Additional blood tests are often needed. These help your health care provider determine the best treatment. Your health care provider will check your stool for blood and look for other causes of blood loss. How is this treated? Treatment varies depending on the cause of the anemia. Treatment can include:  Supplements of iron, vitamin B12, or folic acid.  Hormone medicines.  A blood transfusion. This may be needed if blood loss is severe.  Hospitalization. This  may be needed if there is significant continual blood loss.  Dietary changes.  Spleen removal.  Follow these instructions at home: Keep all follow-up appointments. It often takes many weeks to correct anemia, and having your health care provider check on your condition and your response to treatment is very important. Get help right away if:  You develop extreme weakness, shortness of breath, or chest pain.  You become dizzy or have trouble concentrating.  You develop heavy vaginal bleeding.  You develop a rash.  You have bloody or black, tarry stools.  You faint.  You vomit up blood.  You vomit repeatedly.  You have abdominal pain.  You have a fever or persistent symptoms for more than 2-3 days.  You have a fever and your symptoms suddenly get worse.  You are dehydrated. This information is not intended to replace advice given to you by your health care provider. Make sure you discuss any questions you have with your health care provider. Document Released: 03/01/2004 Document Revised: 07/06/2015 Document Reviewed: 07/18/2012 Elsevier Interactive Patient Education  2017 [employer]

## 2016-07-30 NOTE — [facility]U Note (Signed)
Pt presents to [facility]U c/o headache that started on Friday that will not go away, pt has not taken any medications. Pt also states having nausea that started yesterday. Pt has dry heaved once but not thrown up. Pt also states she has not been feeling the baby move since 1800 last night. Pt has not had PNC since April- pt states her office "discharged her". Pt denies LOF and bleeding

## 2016-07-31 LAB — CULTURE, OB URINE

## 2016-07-31 LAB — RUBELLA SCREEN: RUBELLA: 1.61 {index} (ref >0.99)

## 2016-08-02 LAB — CULTURE, BETA STREP (GROUP B ONLY)

## 2016-08-17 ENCOUNTER — Ambulatory Visit (INDEPENDENT_AMBULATORY_CARE_PROVIDER_SITE_OTHER): Payer: Medicaid Other | Admitting: Obstetrics and Gynecology

## 2016-08-17 ENCOUNTER — Telehealth (HOSPITAL_COMMUNITY): Payer: Self-pay | Admitting: *Deleted

## 2016-08-17 ENCOUNTER — Telehealth: Payer: Self-pay | Admitting: Obstetrics and Gynecology

## 2016-08-17 ENCOUNTER — Other Ambulatory Visit: Payer: Self-pay | Admitting: Advanced Practice Midwife

## 2016-08-17 VITALS — BP 135/78 | HR 101 | Wt 253.6 lb

## 2016-08-17 DIAGNOSIS — O0990 Supervision of high risk pregnancy, unspecified, unspecified trimester: Secondary | ICD-10-CM | POA: Insufficient documentation

## 2016-08-17 DIAGNOSIS — O0933 Supervision of pregnancy with insufficient antenatal care, third trimester: Secondary | ICD-10-CM

## 2016-08-17 DIAGNOSIS — O0930 Supervision of pregnancy with insufficient antenatal care, unspecified trimester: Secondary | ICD-10-CM | POA: Insufficient documentation

## 2016-08-17 DIAGNOSIS — Z8759 Personal history of other complications of pregnancy, childbirth and the puerperium: Secondary | ICD-10-CM | POA: Diagnosis not present

## 2016-08-17 DIAGNOSIS — O0993 Supervision of high risk pregnancy, unspecified, third trimester: Secondary | ICD-10-CM | POA: Diagnosis not present

## 2016-08-17 NOTE — Telephone Encounter (Signed)
Preadmission screen  

## 2016-08-17 NOTE — Progress Notes (Signed)
Subjective:  Danielle [name] is a 31 y.o. [id number] at 66103w3d being seen today for first OB visit in the clinic. She has been seen in [facility]. Prenatal labs and cultures were obtained in [facility].  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 Also a H/O PEC with last pregnancy. IOL at 36 1/2 weeks. BP meds til PPV visit and then no BP meds.  Has been taking a BASA qd.  She is currently monitored for the following issues for this high-risk pregnancy and has Obesity affecting pregnancy in second trimester, antepartum; HSV-1 (herpes simplex virus 1) infection; Supervision of high risk pregnancy, antepartum; Late prenatal care; and History of pre-eclampsia on her problem list.  Patient reports occasional contractions.  Contractions: Irregular. Vag. Bleeding: None.  Movement: Present. Denies leaking of fluid.   The following portions of the patient's history were reviewed and updated as appropriate: allergies, current medications, past family history, past medical history, past social history, past surgical history and problem list. Problem list updated.  Objective:   Vitals:   08/17/16 1106  BP: 135/78  Pulse: (!) 101  Weight: 253 lb 9.6 oz (115 kg)    Fetal Status: Fetal Heart Rate (bpm): 153   Movement: Present     General:  Alert, oriented and cooperative. Patient is in no acute distress.  Skin: Skin is warm and dry. No rash noted.   Cardiovascular: Normal heart rate noted  Respiratory: Normal respiratory effort, no problems with respiration noted  Abdomen: Soft, gravid, appropriate for gestational age. Pain/Pressure: Present     Pelvic:  Cervical exam performed        Extremities: Normal range of motion.  Edema: None  Mental Status: Normal mood and affect. Normal behavior. Normal judgment and thought content.   Urinalysis:      Assessment and Plan:  Pregnancy: [id number] at 42103w3d  1. Supervision of high risk pregnancy, antepartum Labs reviewed. Vaginal cultures and GBS negative IOL schedule  for 41 weeks Continue with HSV suppression  2. Late prenatal care   3. History of pre-eclampsia Continue with BASA BP stable  Term labor symptoms and general obstetric precautions including but not limited to vaginal bleeding, contractions, leaking of fluid and fetal movement were reviewed in detail with the patient. Please refer to After Visit Summary for other counseling recommendations.  Return in about 4 weeks (around 09/14/2016).   Hermina StaggersErvin, Seraphim Trow L, MD

## 2016-08-17 NOTE — Patient Instructions (Signed)
Vaginal Delivery Vaginal delivery means that you will give birth by pushing your baby out of your birth canal (vagina). A team of health care providers will help you before, during, and after vaginal delivery. Birth experiences are unique for every woman and every pregnancy, and birth experiences vary depending on where you choose to give birth. What should I do to prepare for my baby's birth? Before your baby is born, it is important to talk with your health care provider about:  Your labor and delivery preferences. These may include: ? Medicines that you may be given. ? How you will manage your pain. This might include non-medical pain relief techniques or injectable pain relief such as epidural analgesia. ? How you and your baby will be monitored during labor and delivery. ? Who may be in the labor and delivery room with you. ? Your feelings about surgical delivery of your baby (cesarean delivery, or C-section) if this becomes necessary. ? Your feelings about receiving donated blood through an IV tube (blood transfusion) if this becomes necessary.  Whether you are able: ? To take pictures or videos of the birth. ? To eat during labor and delivery. ? To move around, walk, or change positions during labor and delivery.  What to expect after your baby is born, such as: ? Whether delayed umbilical cord clamping and cutting is offered. ? Who will care for your baby right after birth. ? Medicines or tests that may be recommended for your baby. ? Whether breastfeeding is supported in your hospital or birth center. ? How long you will be in the hospital or birth center.  How any medical conditions you have may affect your baby or your labor and delivery experience.  To prepare for your baby's birth, you should also:  Attend all of your health care visits before delivery (prenatal visits) as recommended by your health care provider. This is important.  Prepare your home for your baby's  arrival. Make sure that you have: ? Diapers. ? Baby clothing. ? Feeding equipment. ? Safe sleeping arrangements for you and your baby.  Install a car seat in your vehicle. Have your car seat checked by a certified car seat installer to make sure that it is installed safely.  Think about who will help you with your new baby at home for at least the first several weeks after delivery.  What can I expect when I arrive at the birth center or hospital? Once you are in labor and have been admitted into the hospital or birth center, your health care provider may:  Review your pregnancy history and any concerns you have.  Insert an IV tube into one of your veins. This is used to give you fluids and medicines.  Check your blood pressure, pulse, temperature, and heart rate (vital signs).  Check whether your bag of water (amniotic sac) has broken (ruptured).  Talk with you about your birth plan and discuss pain control options.  Monitoring Your health care provider may monitor your contractions (uterine monitoring) and your baby's heart rate (fetal monitoring). You may need to be monitored:  Often, but not continuously (intermittently).  All the time or for long periods at a time (continuously). Continuous monitoring may be needed if: ? You are taking certain medicines, such as medicine to relieve pain or make your contractions stronger. ? You have pregnancy or labor complications.  Monitoring may be done by:  Placing a special stethoscope or a handheld monitoring device on your abdomen to   check your baby's heartbeat, and feeling your abdomen for contractions. This method of monitoring does not continuously record your baby's heartbeat or your contractions.  Placing monitors on your abdomen (external monitors) to record your baby's heartbeat and the frequency and length of contractions. You may not have to wear external monitors all the time.  Placing monitors inside of your uterus  (internal monitors) to record your baby's heartbeat and the frequency, length, and strength of your contractions. ? Your health care provider may use internal monitors if he or she needs more information about the strength of your contractions or your baby's heart rate. ? Internal monitors are put in place by passing a thin, flexible wire through your vagina and into your uterus. Depending on the type of monitor, it may remain in your uterus or on your baby's head until birth. ? Your health care provider will discuss the benefits and risks of internal monitoring with you and will ask for your permission before inserting the monitors.  Telemetry. This is a type of continuous monitoring that can be done with external or internal monitors. Instead of having to stay in bed, you are able to move around during telemetry. Ask your health care provider if telemetry is an option for you.  Physical exam Your health care provider may perform a physical exam. This may include:  Checking whether your baby is positioned: ? With the head toward your vagina (head-down). This is most common. ? With the head toward the top of your uterus (head-up or breech). If your baby is in a breech position, your health care provider may try to turn your baby to a head-down position so you can deliver vaginally. If it does not seem that your baby can be born vaginally, your provider may recommend surgery to deliver your baby. In rare cases, you may be able to deliver vaginally if your baby is head-up (breech delivery). ? Lying sideways (transverse). Babies that are lying sideways cannot be delivered vaginally.  Checking your cervix to determine: ? Whether it is thinning out (effacing). ? Whether it is opening up (dilating). ? How low your baby has moved into your birth canal.  What are the three stages of labor and delivery?  Normal labor and delivery is divided into the following three stages: Stage 1  Stage 1 is the  longest stage of labor, and it can last for hours or days. Stage 1 includes: ? Early labor. This is when contractions may be irregular, or regular and mild. Generally, early labor contractions are more than 10 minutes apart. ? Active labor. This is when contractions get longer, more regular, more frequent, and more intense. ? The transition phase. This is when contractions happen very close together, are very intense, and may last longer than during any other part of labor.  Contractions generally feel mild, infrequent, and irregular at first. They get stronger, more frequent (about every 2-3 minutes), and more regular as you progress from early labor through active labor and transition.  Many women progress through stage 1 naturally, but you may need help to continue making progress. If this happens, your health care provider may talk with you about: ? Rupturing your amniotic sac if it has not ruptured yet. ? Giving you medicine to help make your contractions stronger and more frequent.  Stage 1 ends when your cervix is completely dilated to 4 inches (10 cm) and completely effaced. This happens at the end of the transition phase. Stage 2  Once   your cervix is completely effaced and dilated to 4 inches (10 cm), you may start to feel an urge to push. It is common for the body to naturally take a rest before feeling the urge to push, especially if you received an epidural or certain other pain medicines. This rest period may last for up to 1-2 hours, depending on your unique labor experience.  During stage 2, contractions are generally less painful, because pushing helps relieve contraction pain. Instead of contraction pain, you may feel stretching and burning pain, especially when the widest part of your baby's head passes through the vaginal opening (crowning).  Your health care provider will closely monitor your pushing progress and your baby's progress through the vagina during stage 2.  Your  health care provider may massage the area of skin between your vaginal opening and anus (perineum) or apply warm compresses to your perineum. This helps it stretch as the baby's head starts to crown, which can help prevent perineal tearing. ? In some cases, an incision may be made in your perineum (episiotomy) to allow the baby to pass through the vaginal opening. An episiotomy helps to make the opening of the vagina larger to allow more room for the baby to fit through.  It is very important to breathe and focus so your health care provider can control the delivery of your baby's head. Your health care provider may have you decrease the intensity of your pushing, to help prevent perineal tearing.  After delivery of your baby's head, the shoulders and the rest of the body generally deliver very quickly and without difficulty.  Once your baby is delivered, the umbilical cord may be cut right away, or this may be delayed for 1-2 minutes, depending on your baby's health. This may vary among health care providers, hospitals, and birth centers.  If you and your baby are healthy enough, your baby may be placed on your chest or abdomen to help maintain the baby's temperature and to help you bond with each other. Some mothers and babies start breastfeeding at this time. Your health care team will dry your baby and help keep your baby warm during this time.  Your baby may need immediate care if he or she: ? Showed signs of distress during labor. ? Has a medical condition. ? Was born too early (prematurely). ? Had a bowel movement before birth (meconium). ? Shows signs of difficulty transitioning from being inside the uterus to being outside of the uterus. If you are planning to breastfeed, your health care team will help you begin a feeding. Stage 3  The third stage of labor starts immediately after the birth of your baby and ends after you deliver the placenta. The placenta is an organ that develops  during pregnancy to provide oxygen and nutrients to your baby in the womb.  Delivering the placenta may require some pushing, and you may have mild contractions. Breastfeeding can stimulate contractions to help you deliver the placenta.  After the placenta is delivered, your uterus should tighten (contract) and become firm. This helps to stop bleeding in your uterus. To help your uterus contract and to control bleeding, your health care provider may: ? Give you medicine by injection, through an IV tube, by mouth, or through your rectum (rectally). ? Massage your abdomen or perform a vaginal exam to remove any blood clots that are left in your uterus. ? Empty your bladder by placing a thin, flexible tube (catheter) into your bladder. ? Encourage   you to breastfeed your baby. After labor is over, you and your baby will be monitored closely to ensure that you are both healthy until you are ready to go home. Your health care team will teach you how to care for yourself and your baby. This information is not intended to replace advice given to you by your health care provider. Make sure you discuss any questions you have with your health care provider. Document Released: 11/01/2007 Document Revised: 08/12/2015 Document Reviewed: 02/06/2015 Elsevier Interactive Patient Education  2018 Elsevier Inc.  

## 2016-08-20 NOTE — Telephone Encounter (Signed)
Patient came in, and was on time.

## 2016-08-21 ENCOUNTER — Inpatient Hospital Stay (HOSPITAL_COMMUNITY)
Admission: RE | Admit: 2016-08-21 | Discharge: 2016-08-23 | DRG: 774 | Disposition: A | Discharge status: Home / Self Care | Payer: Medicaid Other | Source: Ambulatory Visit | Attending: Family Medicine | Admitting: Family Medicine

## 2016-08-21 ENCOUNTER — Encounter (HOSPITAL_COMMUNITY): Payer: Self-pay

## 2016-08-21 DIAGNOSIS — D573 Sickle-cell trait: Secondary | ICD-10-CM | POA: Diagnosis present

## 2016-08-21 DIAGNOSIS — Z91040 Latex allergy status: Secondary | ICD-10-CM

## 2016-08-21 DIAGNOSIS — O480 Post-term pregnancy: Secondary | ICD-10-CM | POA: Diagnosis not present

## 2016-08-21 DIAGNOSIS — E669 Obesity, unspecified: Secondary | ICD-10-CM | POA: Diagnosis present

## 2016-08-21 DIAGNOSIS — O9902 Anemia complicating childbirth: Secondary | ICD-10-CM | POA: Diagnosis present

## 2016-08-21 DIAGNOSIS — A6000 Herpesviral infection of urogenital system, unspecified: Secondary | ICD-10-CM | POA: Diagnosis present

## 2016-08-21 DIAGNOSIS — O9832 Other infections with a predominantly sexual mode of transmission complicating childbirth: Secondary | ICD-10-CM | POA: Diagnosis present

## 2016-08-21 DIAGNOSIS — Z3A41 41 weeks gestation of pregnancy: Secondary | ICD-10-CM | POA: Diagnosis not present

## 2016-08-21 DIAGNOSIS — O99214 Obesity complicating childbirth: Secondary | ICD-10-CM | POA: Diagnosis present

## 2016-08-21 DIAGNOSIS — Z3A40 40 weeks gestation of pregnancy: Secondary | ICD-10-CM

## 2016-08-21 LAB — CBC
HEMATOCRIT: 32.5 % — AB (ref 36.0–46.0)
HEMOGLOBIN: 10.5 g/dL — AB (ref 12.0–15.0)
MCH: 21.4 pg — AB (ref 26.0–34.0)
MCHC: 32.3 g/dL (ref 30.0–36.0)
MCV: 66.3 fL — AB (ref 78.0–100.0)
Platelets: 155 10*3/uL (ref 150–400)
RBC: 4.9 MIL/uL (ref 3.87–5.11)
RDW: 21.3 % — ABNORMAL HIGH (ref 11.5–15.5)
WBC: 6.8 10*3/uL (ref 4.0–10.5)

## 2016-08-21 LAB — TYPE AND SCREEN
ABO/RH(D): O POS
Antibody Screen: NEGATIVE

## 2016-08-21 MED ORDER — LACTATED RINGERS IV SOLN: INTRAVENOUS | Status: DC

## 2016-08-21 MED ORDER — MEASLES, MUMPS & RUBELLA VAC ~~LOC~~ INJ: 0.5000 mL | INJECTION | Freq: Once | SUBCUTANEOUS | Status: DC

## 2016-08-21 MED ORDER — ACETAMINOPHEN 325 MG PO TABS: 650.0000 mg | ORAL_TABLET | ORAL | Status: DC | PRN

## 2016-08-21 MED ORDER — IBUPROFEN 600 MG PO TABS
600.0000 mg | ORAL_TABLET | Freq: Four times a day (QID) | ORAL | Status: DC
  Administered 2016-08-21 – 2016-08-23 (×8): 600 mg via ORAL
  Filled 2016-08-21 (×8): qty 1

## 2016-08-21 MED ORDER — OXYTOCIN 40 UNITS IN LACTATED RINGERS INFUSION - SIMPLE MED: 2.5000 [IU]/h | INTRAVENOUS | Status: DC

## 2016-08-21 MED ORDER — SIMETHICONE 80 MG PO CHEW
80.0000 mg | CHEWABLE_TABLET | ORAL | Status: DC | PRN
Start: 2016-08-21 — End: 2016-08-23

## 2016-08-21 MED ORDER — WITCH HAZEL-GLYCERIN EX PADS: 1.0000 "application " | MEDICATED_PAD | CUTANEOUS | Status: DC | PRN

## 2016-08-21 MED ORDER — SENNOSIDES-DOCUSATE SODIUM 8.6-50 MG PO TABS
2.0000 | ORAL_TABLET | ORAL | Status: DC
  Administered 2016-08-21 – 2016-08-22 (×2): 2 via ORAL
  Filled 2016-08-21 (×2): qty 2

## 2016-08-21 MED ORDER — OXYTOCIN BOLUS FROM INFUSION: 500.0000 mL | Freq: Once | INTRAVENOUS | Status: DC

## 2016-08-21 MED ORDER — TETANUS-DIPHTH-ACELL PERTUSSIS 5-2.5-18.5 LF-MCG/0.5 IM SUSP
0.5000 mL | Freq: Once | INTRAMUSCULAR | Status: AC
  Administered 2016-08-23: 0.5 mL via INTRAMUSCULAR
  Filled 2016-08-21: qty 0.5

## 2016-08-21 MED ORDER — ACYCLOVIR 400 MG PO TABS
400.0000 mg | ORAL_TABLET | Freq: Three times a day (TID) | ORAL | Status: DC
  Filled 2016-08-21 (×3): qty 1

## 2016-08-21 MED ORDER — ZOLPIDEM TARTRATE 5 MG PO TABS
5.0000 mg | ORAL_TABLET | Freq: Every evening | ORAL | Status: DC | PRN
Start: 2016-08-21 — End: 2016-08-23

## 2016-08-21 MED ORDER — ACETAMINOPHEN 325 MG PO TABS
650.0000 mg | ORAL_TABLET | ORAL | Status: DC | PRN
  Administered 2016-08-21: 650 mg via ORAL

## 2016-08-21 MED ORDER — OXYCODONE-ACETAMINOPHEN 5-325 MG PO TABS: 2.0000 | ORAL_TABLET | ORAL | Status: DC | PRN

## 2016-08-21 MED ORDER — OXYTOCIN BOLUS FROM INFUSION
500.0000 mL | Freq: Once | INTRAVENOUS | Status: DC
Start: 2016-08-21 — End: 2016-08-22

## 2016-08-21 MED ORDER — ONDANSETRON HCL 4 MG/2ML IJ SOLN: 4.0000 mg | INTRAMUSCULAR | Status: DC | PRN

## 2016-08-21 MED ORDER — ONDANSETRON HCL 4 MG/2ML IJ SOLN: 4.0000 mg | Freq: Four times a day (QID) | INTRAMUSCULAR | Status: DC | PRN

## 2016-08-21 MED ORDER — COCONUT OIL OIL
1.0000 "application " | TOPICAL_OIL | Status: DC | PRN
  Administered 2016-08-22: 1 via TOPICAL
  Filled 2016-08-21: qty 120

## 2016-08-21 MED ORDER — LACTATED RINGERS IV SOLN: 500.0000 mL | INTRAVENOUS | Status: DC | PRN

## 2016-08-21 MED ORDER — ONDANSETRON HCL 4 MG PO TABS: 4.0000 mg | ORAL_TABLET | ORAL | Status: DC | PRN

## 2016-08-21 MED ORDER — DIBUCAINE 1 % RE OINT: 1.0000 "application " | TOPICAL_OINTMENT | RECTAL | Status: DC | PRN

## 2016-08-21 MED ORDER — SOD CITRATE-CITRIC ACID 500-334 MG/5ML PO SOLN: 30.0000 mL | ORAL | Status: DC | PRN

## 2016-08-21 MED ORDER — MISOPROSTOL 25 MCG QUARTER TABLET
25.0000 ug | ORAL_TABLET | ORAL | Status: DC
  Administered 2016-08-21: 25 ug via VAGINAL
  Filled 2016-08-21: qty 1

## 2016-08-21 MED ORDER — OXYTOCIN 40 UNITS IN LACTATED RINGERS INFUSION - SIMPLE MED
1.0000 m[IU]/min | INTRAVENOUS | Status: DC
  Administered 2016-08-21: 2 m[IU]/min via INTRAVENOUS
  Filled 2016-08-21: qty 1000

## 2016-08-21 MED ORDER — OXYTOCIN 40 UNITS IN LACTATED RINGERS INFUSION - SIMPLE MED: 1.0000 m[IU]/min | INTRAVENOUS | Status: DC

## 2016-08-21 MED ORDER — BENZOCAINE-MENTHOL 20-0.5 % EX AERO
1.0000 "application " | INHALATION_SPRAY | CUTANEOUS | Status: DC | PRN
  Administered 2016-08-21: 1 via TOPICAL
  Filled 2016-08-21: qty 56

## 2016-08-21 MED ORDER — ACETAMINOPHEN 325 MG PO TABS
650.0000 mg | ORAL_TABLET | ORAL | Status: DC | PRN
  Filled 2016-08-21: qty 2

## 2016-08-21 MED ORDER — TERBUTALINE SULFATE 1 MG/ML IJ SOLN
0.2500 mg | Freq: Once | INTRAMUSCULAR | Status: DC | PRN
  Filled 2016-08-21: qty 1

## 2016-08-21 MED ORDER — FENTANYL CITRATE (PF) 100 MCG/2ML IJ SOLN: 100.0000 ug | INTRAMUSCULAR | Status: DC | PRN

## 2016-08-21 MED ORDER — LIDOCAINE HCL (PF) 1 % IJ SOLN: 30.0000 mL | INTRAMUSCULAR | Status: DC | PRN

## 2016-08-21 MED ORDER — DIPHENHYDRAMINE HCL 25 MG PO CAPS: 25.0000 mg | ORAL_CAPSULE | Freq: Four times a day (QID) | ORAL | Status: DC | PRN

## 2016-08-21 MED ORDER — LIDOCAINE HCL (PF) 1 % IJ SOLN
30.0000 mL | INTRAMUSCULAR | Status: DC | PRN
  Filled 2016-08-21: qty 30

## 2016-08-21 MED ORDER — LACTATED RINGERS IV SOLN
INTRAVENOUS | Status: DC
  Administered 2016-08-21 (×3): via INTRAVENOUS

## 2016-08-21 MED ORDER — TERBUTALINE SULFATE 1 MG/ML IJ SOLN: 0.2500 mg | Freq: Once | INTRAMUSCULAR | Status: DC | PRN

## 2016-08-21 MED ORDER — FENTANYL CITRATE (PF) 100 MCG/2ML IJ SOLN
50.0000 ug | INTRAMUSCULAR | Status: DC | PRN
  Administered 2016-08-21: 100 ug via INTRAVENOUS
  Filled 2016-08-21: qty 2

## 2016-08-21 MED ORDER — OXYCODONE-ACETAMINOPHEN 5-325 MG PO TABS
1.0000 | ORAL_TABLET | ORAL | Status: DC | PRN
  Administered 2016-08-22: 1 via ORAL
  Filled 2016-08-21: qty 1

## 2016-08-21 MED ORDER — OXYCODONE-ACETAMINOPHEN 5-325 MG PO TABS: 1.0000 | ORAL_TABLET | ORAL | Status: DC | PRN

## 2016-08-21 MED ORDER — PRENATAL MULTIVITAMIN CH
1.0000 | ORAL_TABLET | Freq: Every day | ORAL | Status: DC
  Administered 2016-08-22 – 2016-08-23 (×2): 1 via ORAL
  Filled 2016-08-21 (×2): qty 1

## 2016-08-21 NOTE — H&P (Signed)
Danielle [name] is a 31 y.o. female [phone] @ 41.0wks presenting for IOL due to postdates. Denies leaking or bldg. No H/A, N/V or visual disturbances. Her preg has been significant for having 1 prenatal visit [facility]. She has a hx of pre-e with her last preg requiring IOL @ 36.4wks.  OB History    Gravida Para Term Preterm AB Living   5 3 3  0 1 3   SAB TAB Ectopic Multiple Live Births   1 0 0 0 3      Obstetric Comments   Pt states she was induced at 4253w4d for pre-eclampsia for last pregnacy     Past Medical History:  Diagnosis Date  . Anxiety   . Depression   . Dizzy spells   . Endometriosis   . Headache(784.0)   . Hypertension    PIH  . Hypothyroidism   . Pregnancy induced hypertension 2016  . PTSD (post-traumatic stress disorder)   . Sickle cell trait (HCC)   . Trauma    Past Surgical History:  Procedure Laterality Date  . DILATION AND CURETTAGE OF UTERUS  2008  . TOOTH EXTRACTION     Family History: family history includes Arthritis in her other; Asthma in her other; Cataracts in her other; Congestive Heart Failure in her other; Diabetes in her father, maternal grandfather, maternal grandmother, paternal grandfather, and paternal grandmother; Early death in her other; Hyperlipidemia in her other; Hypertension in her father and other; Migraines in her other; Other in her cousin; Stroke in her other. Social History:  reports that she has never smoked. She has never used smokeless tobacco. She reports that she does not drink alcohol or use drugs.     Maternal Diabetes: No- not done Genetic Screening: Declined- not done Maternal Ultrasounds/Referrals: Normal- no anatomy scan Fetal Ultrasounds or other Referrals:  None Maternal Substance Abuse:  No UDS neg 07/2016 Significant Maternal Medications:  None Significant Maternal Lab Results:  Lab values include: Group B Strep negative Other Comments:  one prenatal visit, last week  ROS History   Last menstrual  period 10/25/2015, currently breastfeeding. Exam Physical Exam  Constitutional: She is oriented to person, place, and time. She appears well-developed.  HENT:  Head: Normocephalic.  Neck: Normal range of motion.  Cardiovascular: Normal rate.   Respiratory: Effort normal.  GI:  EFM 150s, +accels, no decels Irreg mild ctx  Neurological: She is alert and oriented to person, place, and time.  Skin: Skin is warm and dry.  Psychiatric: She has a normal mood and affect. Her behavior is normal. Thought content normal.    Prenatal labs: ABO, Rh: --/--/O POS (06/25 0752) Antibody: NEG (06/25 0752) Rubella: 1.61 (06/25 0742) RPR: Non Reactive (06/25 0742)  HBsAg: Negative (06/25 0742)  HIV: Non Reactive (06/25 0742)  GBS:   neg 07/30/16  Assessment/Plan: IUP@41 .0wks GBS neg Cx favorable  [facility] Per pt request, will start with a dose of cytotec and then progress to Pitocin prn to achieve active labor Anticipate SVD SW pp for ltd [facility]  Cam HaiSHAW, Karli Wickizer CNM 08/21/2016, 12:24 AM

## 2016-08-21 NOTE — Anesthesia Pain Management Evaluation Note (Signed)
  CRNA Pain Management Visit Note  Patient: Danielle [name], 31 y.o., female  "Hello I am a member of the anesthesia team at [facility]. We have an anesthesia team available at all times to provide care throughout the hospital, including epidural management and anesthesia for C-section. I don't know your plan for the delivery whether it a natural birth, water birth, IV sedation, nitrous supplementation, doula or epidural, but we want to meet your pain goals."   1.Was your pain managed to your expectations on prior hospitalizations?   Yes   2.What is your expectation for pain management during this hospitalization?     Labor support without medications  3.How can we help you reach that goal? Planning natural    Record the patient's initial score and the patient's pain goal.   Pain: 7  Pain Goal: 10 [facility] wants you to be able to say your pain was always managed very well.  Danielle [name] 08/21/2016

## 2016-08-21 NOTE — Lactation Note (Signed)
This note was copied from a baby's chart. Lactation Consultation Note  Patient Name: Danielle [name] XBMWU'XToday's Date: 08/21/2016 Reason for consult: Initial assessment Baby at 5 hr of life. Experienced bf mom reports baby is not opening mouth wide enough and latch is painful. Showed mom how to "tea cup" the areola and pull the chin down after the baby latches. Mom reports this latch is better than the last one. Discussed baby behavior, feeding frequency, baby belly size, voids, wt loss, breast changes, and nipple care. Demonstrated manual expression, colostrum noted bilaterally, spoon in room. Given lactation handouts. Aware of OP services and support group.     Maternal Data Has patient been taught Hand Expression?: Yes Does the patient have breastfeeding experience prior to this delivery?: Yes  Feeding Feeding Type: Breast Fed Length of feed: 15 min  LATCH Score/Interventions Latch: Grasps breast easily, tongue down, lips flanged, rhythmical sucking. Intervention(s): Adjust position;Assist with latch  Audible Swallowing: A few with stimulation  Type of Nipple: Everted at rest and after stimulation  Comfort (Breast/Nipple): Filling, red/small blisters or bruises, mild/mod discomfort  Problem noted: Mild/Moderate discomfort Interventions (Mild/moderate discomfort): Hand expression  Hold (Positioning): Assistance needed to correctly position infant at breast and maintain latch. Intervention(s): Position options;Support Pillows  LATCH Score: 7  Lactation Tools Discussed/Used WIC Program: Yes   Consult Status Consult Status: Follow-up Date: 08/22/16 Follow-up type: In-patient    Danielle [name] 08/21/2016, 8:49 PM

## 2016-08-22 LAB — RPR: RPR: NONREACTIVE

## 2016-08-22 MED ORDER — AMLODIPINE BESYLATE 10 MG PO TABS
10.0000 mg | ORAL_TABLET | Freq: Every day | ORAL | Status: DC
  Administered 2016-08-22 – 2016-08-23 (×2): 10 mg via ORAL
  Filled 2016-08-22 (×3): qty 1

## 2016-08-22 MED ORDER — TRIAMTERENE-HCTZ 37.5-25 MG PO TABS
1.0000 | ORAL_TABLET | Freq: Every day | ORAL | Status: DC
Start: 2016-08-22 — End: 2016-08-22
  Filled 2016-08-22: qty 1

## 2016-08-22 NOTE — Lactation Note (Signed)
This note was copied from a baby's chart. Lactation Consultation Note  Patient Name: Boy Dorna Maiori Fowers RUEAV'WToday's Date: 08/22/2016 Reason for consult: Follow-up assessment Baby at 29 hr of life. Mom is requesting formula because her nipples are sore. The L nipple has a horizontal compression stripe and the R nipple appears normal. Given comfort gels. Mom thinks that baby is not getting enough because he is fussy and will not sleep in the basinet. She was agreeable to spoon or cup feeding formula. She stated manual expression hurt. Discussed risks of formula and artificial nipples. Given formula feeding guidelines and reviewed volumes. Discussed baby behavior, feeding frequency, baby belly size, voids, wt loss, breast changes, and nipple care. Mom is aware of lactation services and support group.    Maternal Data    Feeding Feeding Type: Breast Fed  LATCH Score/Interventions Latch: Grasps breast easily, tongue down, lips flanged, rhythmical sucking.  Audible Swallowing: Spontaneous and intermittent  Type of Nipple: Everted at rest and after stimulation  Comfort (Breast/Nipple): Soft / non-tender     Hold (Positioning): No assistance needed to correctly position infant at breast.  LATCH Score: 10  Lactation Tools Discussed/Used     Consult Status Consult Status: Follow-up Date: 08/23/16 Follow-up type: In-patient    Rulon Eisenmengerlizabeth E Varick Keys 08/22/2016, 8:26 PM

## 2016-08-22 NOTE — Progress Notes (Deleted)
CLINICAL SOCIAL WORK MATERNAL/CHILD NOTE  Patient Details  Name: Danielle [name] MRN: [id number] Date of Birth: 10/31/1984  Date:  08/22/2016  Clinical Social Worker Initiating Note:  Linkoln Alkire Boyd-Gilyard Date/ Time Initiated:  08/22/16/1241     Child's Name:  Danielle [name]   Legal Guardian:  Mother (FOB is Robert [name] (DOB unknown). Telephone number is [phone])   Need for Interpreter:  None   Date of Referral:  08/21/16     Reason for Referral:  Behavioral Health Issues, including SI , Current Substance Use/Substance Use During Pregnancy , Late or No Prenatal Care    Referral Source:  Central Nursery   Address:  [address] [place] [address]  Phone number:  [phone]   Household Members:  Self, Minor Children ([name]'s daughter Danielle [name] (10/09/01) resides with [name].  [name] other 4 children do not reside with [name]. )   Natural Supports (not living in the home):  Spouse/significant other   Professional Supports: Case Manager/Social Worker ([name] has a case manager with [facility]. )   Employment: Unemployed   Type of Work:     Education:  High school graduate   Financial Resources:  Medicaid   Other Resources:  WIC, Food Stamps    Cultural/Religious Considerations Which May Impact Care:  None Reported  Strengths:  Pediatrician chosen , Other (Comment) ([name] is currently in SA program. )   Risk Factors/Current Problems:  Substance Use , Mental Health Concerns , Basic Needs    Cognitive State:  Alert , Able to Concentrate , Linear Thinking , Poor Insight    Mood/Affect:  Calm , Relaxed , Interested , Comfortable , Apprehensive    CSW Assessment: CSW met with [name] to complete an assessment for no PNC and hx of SA.  When CSW arrived, [name] was watching TV in bed with infant, and [name]'s oldest daughter was resting on the couch.  With [name]'s permission, CSW asked [name]'s daughter to step outside of the room in effort to  meet with [name] in private.  [name] had a different attitude from early today when CSW attempted to meet with [name].  [name] was polite, pleasant, and honest during the assessment. CSW explained CSW's role and encouraged [name] to ask questions.   CSW inquired about [name]'s support and [name] reported having little to now supports in [place].  [name] stated that [name] is from [place], [place] and moved to [place] to reside with the father of [name]'s 2nd, 3rd, and 4th child.  [name] reported that the couple is no longer together and her children ([name] 01/22/08, [name] 12/20/11, [name] 12/13/12) are currently living with FOB (Anthony Thomas). [name] also reported that [name]'s oldest son (Danielle [name] 01/16/09) is currently residing in [place] with his maternal grandparents.  [name] stated that Danielle father passed on 06/19/16 in a MVA. Danielle was living with his father for 2.5 years prior to his father's death. [name] stated that [name] is working towards getting her son back to [place] but does not have a plan.  [name] also stated that her son's grandparent's has received legal consultation and is planning to file an order for custody. [name] expressed feelings of sadness and anger towards her son's maternal grandparents.  [name] acknowledged CPS involvement in  [place], and denied CPS involvement in [place].  [name] reported that [name] speaks with her children weekly via Face Time and Face Book Messenger.   CSW inquired about [name]'s lack of   PNC.  [name] reported that [name] did not know she was pregnant and when it was confirmed, [name] was unable to make appointments due to transportation barriers. However, [name] is reporting that transportation is no longer a barrier because [name] has applied and has been approved for Medicaid transportation.   CSW informed [name] of hospital's SA policy.  CSW made [name] aware that infant's UDS was negative and CSW will continue to monitor infant's CDS.  [name] was informed that CSW will make a report to [place] CPS if CDS is positive  without an explanation; [name] was understanding.  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 CSW offered [name] SA resources and [name] reported currently being an established client with [facility]. [name] stated [name] has been in the program for about 30 days.  [name] openly shared applying for the program after being evicted from [employer] [facility] for failure to comply with lease agreement. Although [name] declined SA resources, [name] was receptive to a referral to the [facility] Program.   CSW reviewed SIDS and Safe Sleep with infant and [name] was not receptive.  [name] reported that [name] plans to sleep with infant after CSW educated [name] and encouraged [name] not to co-sleep.  [name] reported having a code for a baby box and [name]'s bedside nurse will provide one for family.    CSW thanked [name] for meeting with CSW and provided [name] with CSW's contact information.    CSW left a message for [place] CPS to make a report regarding [name] insisting on co-sleeping, and [name] not having custody of [name]'s older 4 children.    CSW also made referral to [facility] Program.  At this time there are barriers to d/c until CSW receives disposition safety plan from CPS.   CSW Plan/Description:  Information/Referral to Community Resources , Child Protective Service Report , Patient/Family Education    Caidan Hubbert Boyd-Gilyard, MSW, LCSW Clinical Social Work [phone]  Emily Massar D BOYD-GILYARD, LCSW 08/22/2016, 12:46 PM  

## 2016-08-22 NOTE — Progress Notes (Signed)
Post Partum Day #1 Subjective: no complaints, up ad lib and tolerating PO; denies s/s pre-e; breastfeeding going well; [facility]; considering IUD for pp contraception (does not want any more children, but doesn't want BTL)  Objective: Blood pressure (!) 142/90, pulse (!) 106, temperature 98.1 F (36.7 C), temperature source Oral, resp. rate 18, height 5\' 8"  (1.727 m), weight 112.5 kg (248 lb), last menstrual period 10/25/2015, SpO2 100 %, unknown if currently breastfeeding. BPs 148/65, 159/70, 141/78  Physical Exam:   General: alert, cooperative and no distress Lochia: appropriate Uterine Fundus: firm DVT Evaluation: No evidence of DVT seen on physical exam.   Recent Labs  08/21/16 0108  HGB 10.5*  HCT 32.5*    Assessment/Plan: Plan for discharge tomorrow and Social Work consult  Start on Norvasc 10 due to elevated BPs; will get pp Baby Love bp check in 1 week   LOS: 1 day   Nayan Proch CNM 08/22/2016, 10:09 AM

## 2016-08-22 NOTE — Discharge Instructions (Signed)

## 2016-08-23 MED ORDER — DOCUSATE SODIUM 100 MG PO CAPS: 100.0000 mg | ORAL_CAPSULE | Freq: Two times a day (BID) | ORAL | 0 refills | Status: DC

## 2016-08-23 MED ORDER — AMLODIPINE BESYLATE 10 MG PO TABS: 10.0000 mg | ORAL_TABLET | Freq: Every day | ORAL | 1 refills | Status: DC

## 2016-08-23 MED ORDER — IBUPROFEN 600 MG PO TABS: 600.0000 mg | ORAL_TABLET | Freq: Four times a day (QID) | ORAL | 0 refills | Status: DC

## 2016-08-23 NOTE — Progress Notes (Signed)
CSW acknowledges consult and completed clinical assessment.  Clinical documentation will follow.  There are no barriers to d/c.  Anaisha Mago Boyd-Gilyard, MSW, LCSW Clinical Social Work [phone]   

## 2016-08-23 NOTE — Discharge Summary (Signed)
OB Discharge Summary     Patient Name: Danielle [name] DOB: 16-Oct-1985 MRN: [id number]  Date of admission: 08/21/2016 Delivering MD: Shonna Chock BEDFORD   Date of discharge: 08/23/2016  Admitting diagnosis: INDUCTION Intrauterine pregnancy: [redacted]w[redacted]d     Secondary diagnosis:  Active Problems:   Post-dates pregnancy   Post term pregnancy at [redacted] weeks gestation  Additional problems:  Patient Active Problem List   Diagnosis Date Noted  . Post-dates pregnancy 08/21/2016  . Post term pregnancy at [redacted] weeks gestation 08/21/2016  . Supervision of high risk pregnancy, antepartum 08/17/2016  . Late prenatal care 08/17/2016  . History of pre-eclampsia 08/17/2016  . 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 Obesity affecting pregnancy in second trimester, antepartum        Discharge diagnosis: Term Pregnancy Delivered                                                                                                Post partum procedures:none  Augmentation: AROM, Pitocin and Cytotec  Complications: None  Hospital course:  Onset of Labor With Vaginal Delivery     31 y.o. yo [id number] at [redacted]w[redacted]d was admitted in Latent Labor for induction on 08/21/2016. Patient had an uncomplicated labor course as follows:  Membrane Rupture Time/Date: 1:20 PM ,08/21/2016   Intrapartum Procedures: Episiotomy: None [1]                                         Lacerations:  None [1]  Patient had a delivery of a Viable infant. 08/21/2016  Information for the patient's newborn:  Hazely, Sealey [[id number]]  Delivery Method: Vaginal, Spontaneous Delivery (Filed from Delivery Summary)    Pateint had an uncomplicated postpartum course. She had elevated pressures after delivery and has been started on Norvasc 10 mg. She will follow up for a blood pressure check in 1 week. Her pressures here have been mostly normal to low range elevated since starting the Norvasc. She is ambulating, tolerating a regular diet,  passing flatus, has had a normal bowel movement, and is urinating well. Patient is discharged home in stable condition on 08/23/16. Baby discharge is pending CPS disposition safety plan.   Physical exam  Vitals:   08/21/16 2200 08/22/16 0600 08/22/16 1135 08/22/16 1738  BP: (!) 148/65 (!) 142/90 134/74 137/72  Pulse: 91 (!) 106  92  Resp: 18 18  18   Temp: 98.6 F (37 C) 98.1 F (36.7 C)  98.4 F (36.9 C)  TempSrc: Oral Oral  Oral  SpO2:      Weight:      Height:       General: alert, cooperative and no distress Lochia: appropriate Uterine Fundus: firm Incision: N/A DVT Evaluation: No evidence of DVT seen on physical exam. Negative Homan's sign. No significant calf/ankle edema. Labs: Lab Results  Component Value Date   WBC 6.8 08/21/2016   HGB 10.5 (L) 08/21/2016   HCT 32.5 (L) 08/21/2016   MCV 66.3 (L) 08/21/2016   PLT  155 08/21/2016   CMP Latest Ref Rng & Units 07/30/2016  Glucose 65 - 99 mg/dL 95  BUN 6 - 20 mg/dL 5(L)  Creatinine 1.610.44 - 1.00 mg/dL 0.96(E0.37(L)  Sodium 454135 - 098145 mmol/L 133(L)  Potassium 3.5 - 5.1 mmol/L 3.4(L)  Chloride 101 - 111 mmol/L 105  CO2 22 - 32 mmol/L 19(L)  Calcium 8.9 - 10.3 mg/dL 1.1(B8.4(L)  Total Protein 6.5 - 8.1 g/dL 6.4(L)  Total Bilirubin 0.3 - 1.2 mg/dL 1.4(N0.2(L)  Alkaline Phos 38 - 126 U/L 85  AST 15 - 41 U/L 17  ALT 14 - 54 U/L 7(L)    Discharge instruction: per After Visit Summary and "Baby and Me Booklet".  After visit meds:  Allergies as of 08/23/2016      Reactions   Latex Hives, Rash      Medication List    TAKE these medications   amLODipine 10 MG tablet Commonly known as:  NORVASC Take 1 tablet (10 mg total) by mouth daily.   aspirin 81 MG chewable tablet Chew 81 mg by mouth daily.   CONCEPT DHA 53.5-38-1 MG Caps Take 1 tablet by mouth daily.   ferrous sulfate 325 (65 FE) MG tablet Take 1 tablet (325 mg total) by mouth daily.   ibuprofen 600 MG tablet Commonly known as:  ADVIL,MOTRIN Take 1 tablet (600 mg  total) by mouth every 6 (six) hours.   valACYclovir 500 MG tablet Commonly known as:  VALTREX TAKE ONE TABLET BY MOUTH TWO TIMES DAILY. (BLUE OBLONG TAB)       Diet: routine diet  Activity: Advance as tolerated. Pelvic rest for 6 weeks.   Outpatient follow up:1 week BP check, 4-6 week follow up for postpartum care Follow up Appt:Future Appointments Date Time Provider Department Center  09/13/2016 2:40 PM Aviva SignsWilliams, Marie L, CNM [facility]   Follow up Visit:No Follow-up on file.  Postpartum contraception: Undecided- considering IUD but states she will decide before her PP visit with [facility].  Newborn Data: Live born female  Birth Weight: 7 lb 7.8 oz (3396 g) APGAR: 9, 9  Baby Feeding: Breast Disposition:home with mother   08/23/2016 [place] Shirley, DO PGY-1 Resident  OB FELLOW DISCHARGE ATTESTATION  I have seen and examined this patient and agree with above documentation in the resident's note.   Jen MowElizabeth Mumaw, DO OB Fellow 9:48 AM
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
## 2016-08-23 NOTE — Lactation Note (Addendum)
This note was copied from a baby's chart. Lactation Consultation Note  Patient Name: Danielle [name] [id number] Date: 08/23/2016 Reason for consult: Follow-up assessment;Breast/nipple pain;Infant weight loss   Follow up with mom of 41 hour old infant. Infant with 9 BF for 10-50 minutes, 5 voids and 0 stools (3 stools in life)  in last 24 hours. LATCH scores 8-10. Maternal history of Hypothyroidism and PIH. Infant weight 6 lb 13.5 oz with 9% weight loss since birth.   Mom had infant latched and feeding at the right breast in the cradle hold when [facility] entered room. Infant was supported and positioned well and lips were flanged. Infant actively nursing and was not noted to have swallows. Intermittent breast compression performed and swallows were still not noted. Infant was removed from the breast as mom c/o persistent pain throughout the feeding. Nipple was noted to be compressed. Nipple tissue intact. We attempted to relatch infant and mom was in a lot of pain, infant was removed from breast. Mom cup fed infant 15 cc formula and he tolerated it well. Discussed the importance of calories today and ok to use bottle to feed infant.   Mom with soft compressible breasts and everted nipples, left nipple with positional stripe. Mom is using comfort gels. Milk was easily expressible from right breast. Infant with slightly high palate and biting/clamping of jaws. He does keep his tongue pulled back in his mouth with suckling. Although hard to visualize at this time, he may have short lingual frenulum. He does not extend tongue well at this time, he can elevate to almost to the roof of the mouth. He form a good seal on a finger when he is suckling. Mom did ask if infant has a tongue or lip tie, discussed mobility of tongue and biting/jaw clamping with mom.   DEBP set up with instructions for use on Initiate setting, assembling, disassembling and cleaning of pump parts. Enc mom to pump post BF for 15-20 minutes and to  feed all EBM to infant at next feeding, to use Alimentum if not enough EBM obtained. Reviewed supplementation amounts per day of age. Mom has Ahmeda DEBP pump for home use.   Mom very concerned infant not getting enough to eat. Discussed with weight loss, decreased stools, and minimal swallows at the breast, infant needs to be supplemented at least every 3 hours after BF or when not latching to the breast. Mom voiced understanding and feels infant needs to be supplemented.   I/O, Engorgement prevention/treatment and breast milk handling and storage reveiwed. Infant with follow up Ped appt tomorrow with [facility]. Dr [facility] Filter was notified of findings and plan. Follow up OP appt made for 7/23 @ 1000. Appointment reminder given.   Mom is to call out for next feeding to try using a NS to see if it helps with pain/milk transfer.   Plan: Breast feed infant at the breast 8-12 x a day at first feeding cues Offer infant supplement of Breast Milk/Alimentum after each BF according to supplementation guidelines Pump breasts with DEBP for 15 minutes Hand express post pumping Call for assistance as needed.   Maternal Data Formula Feeding for Exclusion: No Has patient been taught Hand Expression?: Yes Does the patient have breastfeeding experience prior to this delivery?: Yes  Feeding Feeding Type: Breast Fed Length of feed: 15 min  LATCH Score/Interventions Latch: Grasps breast easily, tongue down, lips flanged, rhythmical sucking. Intervention(s): Adjust position;Assist with latch;Breast massage;Breast compression  Audible Swallowing: None Intervention(s): Hand expression  Intervention(s): Alternate breast massage;Skin to skin  Type of Nipple: Everted at rest and after stimulation  Comfort (Breast/Nipple): Filling, red/small blisters or bruises, mild/mod discomfort  Problem noted: Mild/Moderate discomfort;Cracked, bleeding, blisters, bruises Interventions  (Cracked/bleeding/bruising/blister):  Expressed breast milk to nipple;Double electric pump Interventions (Mild/moderate discomfort): Hand expression;Comfort gels;Post-pump  Hold (Positioning): No assistance needed to correctly position infant at breast. Intervention(s): Breastfeeding basics reviewed;Support Pillows;Position options;Skin to skin  LATCH Score: 7  Lactation Tools Discussed/Used WIC Program: Yes Pump Review: Setup, frequency, and cleaning;Milk Storage Initiated by:: Noralee StainSharon Sharay Bellissimo, RN, IBCLC Date initiated:: 08/23/16   Consult Status Consult Status: Follow-up Date: 08/27/16 Follow-up type: Out-patient    Silas FloodSharon S Jaycub Noorani 08/23/2016, 9:52 AM

## 2016-09-13 ENCOUNTER — Ambulatory Visit: Payer: Self-pay | Admitting: Advanced Practice Midwife

## 2016-09-13 ENCOUNTER — Telehealth: Payer: Self-pay | Admitting: Advanced Practice Midwife

## 2016-09-13 NOTE — Telephone Encounter (Signed)
Called patient and left a detailed voicemail about her appointment she missed today.

## 2016-10-15 ENCOUNTER — Ambulatory Visit: Payer: Self-pay | Admitting: Medical

## 2016-10-15 ENCOUNTER — Encounter: Payer: Self-pay | Admitting: Medical

## 2016-11-14 ENCOUNTER — Encounter (HOSPITAL_COMMUNITY): Payer: Self-pay | Admitting: Emergency Medicine

## 2016-11-14 ENCOUNTER — Ambulatory Visit (HOSPITAL_COMMUNITY)
Admission: EM | Admit: 2016-11-14 | Discharge: 2016-11-14 | Disposition: A | Discharge status: Home / Self Care | Payer: No Typology Code available for payment source | Source: Ambulatory Visit | Attending: Emergency Medicine | Admitting: Emergency Medicine

## 2016-11-14 ENCOUNTER — Emergency Department (HOSPITAL_COMMUNITY)
Admission: EM | Admit: 2016-11-14 | Discharge: 2016-11-14 | Disposition: A | Discharge status: Home / Self Care | Payer: Medicaid Other | Attending: Emergency Medicine | Admitting: Emergency Medicine

## 2016-11-14 DIAGNOSIS — D573 Sickle-cell trait: Secondary | ICD-10-CM | POA: Diagnosis not present

## 2016-11-14 DIAGNOSIS — E039 Hypothyroidism, unspecified: Secondary | ICD-10-CM | POA: Insufficient documentation

## 2016-11-14 DIAGNOSIS — Z7982 Long term (current) use of aspirin: Secondary | ICD-10-CM | POA: Diagnosis not present

## 2016-11-14 DIAGNOSIS — Z91040 Latex allergy status: Secondary | ICD-10-CM | POA: Insufficient documentation

## 2016-11-14 DIAGNOSIS — F419 Anxiety disorder, unspecified: Secondary | ICD-10-CM | POA: Diagnosis not present

## 2016-11-14 DIAGNOSIS — Z833 Family history of diabetes mellitus: Secondary | ICD-10-CM | POA: Insufficient documentation

## 2016-11-14 DIAGNOSIS — Z0441 Encounter for examination and observation following alleged adult rape: Secondary | ICD-10-CM | POA: Insufficient documentation

## 2016-11-14 DIAGNOSIS — F329 Major depressive disorder, single episode, unspecified: Secondary | ICD-10-CM | POA: Diagnosis not present

## 2016-11-14 DIAGNOSIS — Z79899 Other long term (current) drug therapy: Secondary | ICD-10-CM | POA: Insufficient documentation

## 2016-11-14 DIAGNOSIS — I10 Essential (primary) hypertension: Secondary | ICD-10-CM | POA: Insufficient documentation

## 2016-11-14 DIAGNOSIS — Z8249 Family history of ischemic heart disease and other diseases of the circulatory system: Secondary | ICD-10-CM | POA: Diagnosis not present

## 2016-11-14 DIAGNOSIS — F4310 Post-traumatic stress disorder, unspecified: Secondary | ICD-10-CM | POA: Diagnosis not present

## 2016-11-14 DIAGNOSIS — T7421XA Adult sexual abuse, confirmed, initial encounter: Secondary | ICD-10-CM

## 2016-11-14 LAB — I-STAT BETA HCG BLOOD, ED (MC, WL, AP ONLY)

## 2016-11-14 LAB — RAPID HIV SCREEN (HIV 1/2 AB+AG)
HIV 1/2 ANTIBODIES: NONREACTIVE
HIV-1 P24 ANTIGEN - HIV24: NONREACTIVE

## 2016-11-14 MED ORDER — CEFTRIAXONE SODIUM 250 MG IJ SOLR
250.0000 mg | Freq: Once | INTRAMUSCULAR | Status: AC
  Administered 2016-11-14: 250 mg via INTRAMUSCULAR
  Filled 2016-11-14: qty 250

## 2016-11-14 MED ORDER — METRONIDAZOLE 500 MG PO TABS
2000.0000 mg | ORAL_TABLET | Freq: Once | ORAL | Status: AC
  Administered 2016-11-14: 2000 mg via ORAL
  Filled 2016-11-14: qty 4

## 2016-11-14 MED ORDER — AMLODIPINE BESYLATE 5 MG PO TABS
10.0000 mg | ORAL_TABLET | Freq: Once | ORAL | Status: AC
  Administered 2016-11-14: 10 mg via ORAL
  Filled 2016-11-14 (×2): qty 2

## 2016-11-14 MED ORDER — AMLODIPINE BESYLATE 10 MG PO TABS: 10.0000 mg | ORAL_TABLET | Freq: Every day | ORAL | 0 refills | Status: DC

## 2016-11-14 MED ORDER — LIDOCAINE HCL (PF) 2 % IJ SOLN
INTRAMUSCULAR | Status: AC
  Administered 2016-11-14: 10 mL
  Filled 2016-11-14: qty 10

## 2016-11-14 MED ORDER — AZITHROMYCIN 250 MG PO TABS
1000.0000 mg | ORAL_TABLET | Freq: Once | ORAL | Status: AC
  Administered 2016-11-14: 1000 mg via ORAL
  Filled 2016-11-14: qty 4

## 2016-11-14 NOTE — ED Notes (Signed)
SANE nurse called. Spoke with French Ana. On her way from [facility].

## 2016-11-14 NOTE — ED Notes (Signed)
SANE nurse at patient bedside. [employer] officer outside door.
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
## 2016-11-14 NOTE — ED Notes (Signed)
EDP aware of patient and needs for SANE nurse. Orders to be placed.
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
## 2016-11-14 NOTE — ED Notes (Signed)
Discharge instructions reviewed with patient. Patient verbalizes understanding. VSS.   
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
## 2016-11-23 ENCOUNTER — Emergency Department (HOSPITAL_COMMUNITY): Payer: Medicaid Other

## 2016-11-23 ENCOUNTER — Emergency Department (HOSPITAL_COMMUNITY)
Admission: EM | Admit: 2016-11-23 | Discharge: 2016-11-23 | Disposition: A | Discharge status: Home / Self Care | Payer: Medicaid Other | Attending: Emergency Medicine | Admitting: Emergency Medicine

## 2016-11-23 ENCOUNTER — Encounter (HOSPITAL_COMMUNITY): Payer: Self-pay | Admitting: Emergency Medicine

## 2016-11-23 DIAGNOSIS — Z3201 Encounter for pregnancy test, result positive: Secondary | ICD-10-CM | POA: Diagnosis not present

## 2016-11-23 DIAGNOSIS — E039 Hypothyroidism, unspecified: Secondary | ICD-10-CM | POA: Insufficient documentation

## 2016-11-23 DIAGNOSIS — I10 Essential (primary) hypertension: Secondary | ICD-10-CM | POA: Diagnosis not present

## 2016-11-23 DIAGNOSIS — K8020 Calculus of gallbladder without cholecystitis without obstruction: Secondary | ICD-10-CM | POA: Insufficient documentation

## 2016-11-23 DIAGNOSIS — Z91040 Latex allergy status: Secondary | ICD-10-CM | POA: Diagnosis not present

## 2016-11-23 DIAGNOSIS — Z79899 Other long term (current) drug therapy: Secondary | ICD-10-CM | POA: Diagnosis not present

## 2016-11-23 DIAGNOSIS — R52 Pain, unspecified: Secondary | ICD-10-CM

## 2016-11-23 DIAGNOSIS — R112 Nausea with vomiting, unspecified: Secondary | ICD-10-CM | POA: Insufficient documentation

## 2016-11-23 DIAGNOSIS — Z3A01 Less than 8 weeks gestation of pregnancy: Secondary | ICD-10-CM

## 2016-11-23 DIAGNOSIS — R1011 Right upper quadrant pain: Secondary | ICD-10-CM | POA: Diagnosis present

## 2016-11-23 DIAGNOSIS — R110 Nausea: Secondary | ICD-10-CM

## 2016-11-23 LAB — COMPREHENSIVE METABOLIC PANEL
ALBUMIN: 3.9 g/dL (ref 3.5–5.0)
ALK PHOS: 79 U/L (ref 38–126)
ALT: 63 U/L — AB (ref 14–54)
ANION GAP: 7 (ref 5–15)
AST: 136 U/L — ABNORMAL HIGH (ref 15–41)
BILIRUBIN TOTAL: 0.6 mg/dL (ref 0.3–1.2)
BUN: 12 mg/dL (ref 6–20)
CALCIUM: 8.8 mg/dL — AB (ref 8.9–10.3)
CO2: 25 mmol/L (ref 22–32)
CREATININE: 0.49 mg/dL (ref 0.44–1.00)
Chloride: 105 mmol/L (ref 101–111)
GFR calc non Af Amer: >60 mL/min (ref >60)
GLUCOSE: 101 mg/dL — AB (ref 65–99)
Potassium: 3.5 mmol/L (ref 3.5–5.1)
Sodium: 137 mmol/L (ref 135–145)
TOTAL PROTEIN: 7.7 g/dL (ref 6.5–8.1)

## 2016-11-23 LAB — CBC
HCT: 36.1 % (ref 36.0–46.0)
HEMOGLOBIN: 11.4 g/dL — AB (ref 12.0–15.0)
MCH: 22.5 pg — AB (ref 26.0–34.0)
MCHC: 31.6 g/dL (ref 30.0–36.0)
MCV: 71.3 fL — ABNORMAL LOW (ref 78.0–100.0)
PLATELETS: 219 10*3/uL (ref 150–400)
RBC: 5.06 MIL/uL (ref 3.87–5.11)
RDW: 16 % — ABNORMAL HIGH (ref 11.5–15.5)
WBC: 8.6 10*3/uL (ref 4.0–10.5)

## 2016-11-23 LAB — URINALYSIS, ROUTINE W REFLEX MICROSCOPIC
Bilirubin Urine: NEGATIVE
GLUCOSE, UA: NEGATIVE mg/dL
HGB URINE DIPSTICK: NEGATIVE
Ketones, ur: NEGATIVE mg/dL
NITRITE: NEGATIVE
PH: 6 (ref 5.0–8.0)
Protein, ur: 30 mg/dL — AB
SPECIFIC GRAVITY, URINE: 1.02 (ref 1.005–1.030)

## 2016-11-23 LAB — LIPASE, BLOOD: Lipase: 27 U/L (ref 11–51)

## 2016-11-23 LAB — HCG, QUANTITATIVE, PREGNANCY: hCG, Beta Chain, Quant, S: 51 m[IU]/mL — ABNORMAL HIGH (ref <5)

## 2016-11-23 LAB — PREGNANCY, URINE: Preg Test, Ur: POSITIVE — AB

## 2016-11-23 MED ORDER — ONDANSETRON HCL 4 MG/2ML IJ SOLN
4.0000 mg | Freq: Once | INTRAMUSCULAR | Status: AC
  Administered 2016-11-23: 4 mg via INTRAVENOUS
  Filled 2016-11-23: qty 2

## 2016-11-23 MED ORDER — ONDANSETRON 4 MG PO TBDP
4.0000 mg | ORAL_TABLET | Freq: Once | ORAL | Status: AC | PRN
  Administered 2016-11-23: 4 mg via ORAL
  Filled 2016-11-23: qty 1

## 2016-11-23 MED ORDER — ONDANSETRON 4 MG PO TBDP: 4.0000 mg | ORAL_TABLET | Freq: Three times a day (TID) | ORAL | 0 refills | Status: DC | PRN

## 2016-11-23 MED ORDER — CONCEPT DHA 53.5-38-1 MG PO CAPS: 1.0000 | ORAL_CAPSULE | Freq: Every day | ORAL | 0 refills | Status: DC

## 2016-11-23 MED ORDER — SODIUM CHLORIDE 0.9 % IV BOLUS (SEPSIS)
1000.0000 mL | Freq: Once | INTRAVENOUS | Status: AC
  Administered 2016-11-23: 1000 mL via INTRAVENOUS

## 2016-11-23 MED ORDER — MORPHINE SULFATE (PF) 4 MG/ML IV SOLN
4.0000 mg | Freq: Once | INTRAVENOUS | Status: DC
Start: 2016-11-23 — End: 2016-11-23
  Filled 2016-11-23: qty 1
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
## 2016-11-23 NOTE — ED Notes (Signed)
Bed: WA08 Expected date:  Expected time:  Means of arrival:  Comments: 

## 2016-11-23 NOTE — ED Notes (Signed)
Ultrasound at bedside

## 2016-11-23 NOTE — ED Triage Notes (Signed)
Pt is c/o upper abd pain that started about 2340 tonight  Pt states she thinks it is her gallbadder  Pt states she has had nausea and vomiting tonight  States pain has decreased some now   Pt states she has had 2 other episodes of this pain twice in the past month

## 2016-12-10 ENCOUNTER — Inpatient Hospital Stay (HOSPITAL_COMMUNITY)
Admission: AD | Admit: 2016-12-10 | Discharge: 2016-12-10 | Discharge status: Against Medical Advice | Payer: Medicaid Other | Source: Ambulatory Visit | Attending: Obstetrics & Gynecology | Admitting: Obstetrics & Gynecology

## 2016-12-10 ENCOUNTER — Encounter (HOSPITAL_COMMUNITY): Payer: Self-pay | Admitting: *Deleted

## 2016-12-10 DIAGNOSIS — O3680X0 Pregnancy with inconclusive fetal viability, not applicable or unspecified: Secondary | ICD-10-CM

## 2016-12-10 DIAGNOSIS — Z5321 Procedure and treatment not carried out due to patient leaving prior to being seen by health care provider: Secondary | ICD-10-CM | POA: Insufficient documentation

## 2016-12-10 LAB — URINALYSIS, ROUTINE W REFLEX MICROSCOPIC
BILIRUBIN URINE: NEGATIVE
Bacteria, UA: NONE SEEN
GLUCOSE, UA: NEGATIVE mg/dL
HGB URINE DIPSTICK: NEGATIVE
KETONES UR: NEGATIVE mg/dL
LEUKOCYTES UA: NEGATIVE
Nitrite: NEGATIVE
PROTEIN: 30 mg/dL — AB
Specific Gravity, Urine: 1.02 (ref 1.005–1.030)
Squamous Epithelial / LPF: NONE SEEN
pH: 6 (ref 5.0–8.0)

## 2016-12-10 NOTE — MAU Note (Signed)
Pt not in lobby.  

## 2016-12-10 NOTE — MAU Note (Signed)
Not in lobby

## 2016-12-10 NOTE — MAU Note (Signed)
Pt presents to MAU with complaints of lower abdominal cramping since last night. Pt states she had a SANE evaluation done October the 9th and was told that with the medications she was given it may cause a yeast infection

## 2017-01-04 ENCOUNTER — Other Ambulatory Visit: Payer: Self-pay | Admitting: Obstetrics and Gynecology

## 2017-01-09 ENCOUNTER — Inpatient Hospital Stay (HOSPITAL_COMMUNITY): Payer: Medicaid Other

## 2017-01-09 ENCOUNTER — Ambulatory Visit (HOSPITAL_COMMUNITY): Admission: RE | Admit: 2017-01-09 | Payer: Medicaid Other | Source: Ambulatory Visit

## 2017-01-09 ENCOUNTER — Encounter (HOSPITAL_COMMUNITY): Payer: Self-pay | Admitting: *Deleted

## 2017-01-09 ENCOUNTER — Inpatient Hospital Stay (HOSPITAL_COMMUNITY)
Admission: AD | Admit: 2017-01-09 | Discharge: 2017-01-09 | Disposition: A | Discharge status: Home / Self Care | Payer: Medicaid Other | Source: Ambulatory Visit | Attending: Family Medicine | Admitting: Family Medicine

## 2017-01-09 DIAGNOSIS — O10011 Pre-existing essential hypertension complicating pregnancy, first trimester: Secondary | ICD-10-CM | POA: Diagnosis not present

## 2017-01-09 DIAGNOSIS — R109 Unspecified abdominal pain: Secondary | ICD-10-CM

## 2017-01-09 DIAGNOSIS — Z3A01 Less than 8 weeks gestation of pregnancy: Secondary | ICD-10-CM | POA: Diagnosis not present

## 2017-01-09 DIAGNOSIS — I10 Essential (primary) hypertension: Secondary | ICD-10-CM

## 2017-01-09 DIAGNOSIS — O209 Hemorrhage in early pregnancy, unspecified: Secondary | ICD-10-CM

## 2017-01-09 DIAGNOSIS — O26891 Other specified pregnancy related conditions, first trimester: Secondary | ICD-10-CM | POA: Diagnosis not present

## 2017-01-09 DIAGNOSIS — Z113 Encounter for screening for infections with a predominantly sexual mode of transmission: Secondary | ICD-10-CM

## 2017-01-09 DIAGNOSIS — O3680X0 Pregnancy with inconclusive fetal viability, not applicable or unspecified: Secondary | ICD-10-CM

## 2017-01-09 HISTORY — DX: Unspecified abnormal cytological findings in specimens from vagina: R87.629

## 2017-01-09 HISTORY — DX: Unspecified fracture of right lower leg, initial encounter for closed fracture: S82.91XA

## 2017-01-09 HISTORY — DX: Unspecified fracture of right foot, initial encounter for closed fracture: S92.901A

## 2017-01-09 LAB — CBC
HEMATOCRIT: 35.7 % — AB (ref 36.0–46.0)
HEMOGLOBIN: 10.9 g/dL — AB (ref 12.0–15.0)
MCH: 22.3 pg — ABNORMAL LOW (ref 26.0–34.0)
MCHC: 30.5 g/dL (ref 30.0–36.0)
MCV: 73 fL — ABNORMAL LOW (ref 78.0–100.0)
Platelets: 216 10*3/uL (ref 150–400)
RBC: 4.89 MIL/uL (ref 3.87–5.11)
RDW: 16.5 % — ABNORMAL HIGH (ref 11.5–15.5)
WBC: 6.3 10*3/uL (ref 4.0–10.5)

## 2017-01-09 LAB — URINALYSIS, ROUTINE W REFLEX MICROSCOPIC
Bacteria, UA: NONE SEEN
Bilirubin Urine: NEGATIVE
Glucose, UA: NEGATIVE mg/dL
Ketones, ur: NEGATIVE mg/dL
Leukocytes, UA: NEGATIVE
Nitrite: NEGATIVE
PROTEIN: 30 mg/dL — AB
SPECIFIC GRAVITY, URINE: 1.019 (ref 1.005–1.030)
pH: 7 (ref 5.0–8.0)

## 2017-01-09 LAB — WET PREP, GENITAL
CLUE CELLS WET PREP: NONE SEEN
Sperm: NONE SEEN
TRICH WET PREP: NONE SEEN
YEAST WET PREP: NONE SEEN

## 2017-01-09 LAB — HEPATITIS B SURFACE ANTIGEN: HEP B S AG: NEGATIVE

## 2017-01-09 LAB — HCG, QUANTITATIVE, PREGNANCY: HCG, BETA CHAIN, QUANT, S: 314 m[IU]/mL — AB (ref <5)

## 2017-01-09 LAB — ABO/RH: ABO/RH(D): O POS

## 2017-01-09 MED ORDER — TRAMADOL HCL 50 MG PO TABS: 50.0000 mg | ORAL_TABLET | Freq: Four times a day (QID) | ORAL | 0 refills | Status: DC | PRN

## 2017-01-09 MED ORDER — AMLODIPINE BESYLATE 10 MG PO TABS: 10.0000 mg | ORAL_TABLET | Freq: Every day | ORAL | 1 refills | Status: DC

## 2017-01-09 MED ORDER — ACETAMINOPHEN 500 MG PO TABS
1000.0000 mg | ORAL_TABLET | Freq: Once | ORAL | Status: AC
  Administered 2017-01-09: 1000 mg via ORAL
  Filled 2017-01-09: qty 2
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
## 2017-01-09 NOTE — MAU Note (Signed)
Pt reports vaginal bleeding and pain since yesterday.

## 2017-01-09 NOTE — Discharge Instructions (Signed)

## 2017-01-10 LAB — GC/CHLAMYDIA PROBE AMP (~~LOC~~) NOT AT ARMC
CHLAMYDIA, DNA PROBE: NEGATIVE
NEISSERIA GONORRHEA: NEGATIVE

## 2017-01-10 LAB — RPR: RPR: NONREACTIVE

## 2017-01-10 LAB — HIV ANTIBODY (ROUTINE TESTING W REFLEX): HIV SCREEN 4TH GENERATION: NONREACTIVE

## 2017-01-11 ENCOUNTER — Encounter: Payer: Self-pay | Admitting: *Deleted

## 2017-01-11 ENCOUNTER — Telehealth: Payer: Self-pay | Admitting: *Deleted

## 2017-01-11 ENCOUNTER — Ambulatory Visit: Payer: Self-pay

## 2017-01-11 NOTE — Telephone Encounter (Signed)
Danielle [name] did not keep her scheduled appointment for follow up for stat bhcg. I called and left a message notifying her she missed her appointment and it is very important she be seen and should go to MAU over the weekend as we are closed.  Will send letter.

## 2017-01-14 ENCOUNTER — Ambulatory Visit: Payer: Self-pay

## 2017-02-05 NOTE — L&D Delivery Note (Signed)
Patient: Danielle [name] MRN: [id number]  GBS status: neg, IAP given n/a  Patient is a 32 y.o. now [id number] s/p NSVD at 7533w3d, who was admitted for IOL 2/2 cholestasis, cHTN. AROM 2h 6468m prior to delivery with blood tinged fluid.    Delivery Note Head delivered ROA. No nuchal cord present. Shoulder and body delivered in usual fashion. Infant with spontaneous cry, placed on mother's abdomen, dried and bulb suctioned. Cord clamped x 2 after 1-minute delay, and cut by [name]. Cord blood drawn. Placenta delivered spontaneously with gentle cord traction. Fundus firm with massage and Pitocin. Perineum inspected and found to have no laceration.  At 6:15 PM a healthy female was delivered via Vaginal, Spontaneous (Presentation: vertex; ROA).  APGAR: 9, 9; weight pending.   Placenta status: spontaneous, intact. 3V Cord:    Anesthesia: none  Episiotomy: None Lacerations: None Suture Repair: none Est. Blood Loss (mL): 426  Mom to postpartum.  Baby to Couplet care / Skin to Skin.  Danielle [name] 10/29/2017, 6:42 PM

## 2017-02-19 ENCOUNTER — Emergency Department (HOSPITAL_COMMUNITY): Payer: Medicaid Other

## 2017-02-19 ENCOUNTER — Emergency Department (HOSPITAL_COMMUNITY)
Admission: EM | Admit: 2017-02-19 | Discharge: 2017-02-19 | Discharge status: Against Medical Advice | Payer: Medicaid Other | Attending: Emergency Medicine | Admitting: Emergency Medicine

## 2017-02-19 ENCOUNTER — Encounter: Payer: Self-pay | Admitting: Emergency Medicine

## 2017-02-19 DIAGNOSIS — Z5321 Procedure and treatment not carried out due to patient leaving prior to being seen by health care provider: Secondary | ICD-10-CM | POA: Diagnosis not present

## 2017-02-19 DIAGNOSIS — R109 Unspecified abdominal pain: Secondary | ICD-10-CM | POA: Insufficient documentation

## 2017-02-19 LAB — CBC WITH DIFFERENTIAL/PLATELET
BASOS PCT: 2 %
Basophils Absolute: 0.1 10*3/uL (ref 0.0–0.1)
Eosinophils Absolute: 0 10*3/uL (ref 0.0–0.7)
Eosinophils Relative: 0 %
HCT: 35.2 % — ABNORMAL LOW (ref 36.0–46.0)
HEMOGLOBIN: 10.9 g/dL — AB (ref 12.0–15.0)
LYMPHS PCT: 53 %
Lymphs Abs: 3.8 10*3/uL (ref 0.7–4.0)
MCH: 21.2 pg — AB (ref 26.0–34.0)
MCHC: 31 g/dL (ref 30.0–36.0)
MCV: 68.3 fL — ABNORMAL LOW (ref 78.0–100.0)
MONOS PCT: 9 %
Monocytes Absolute: 0.6 10*3/uL (ref 0.1–1.0)
NEUTROS PCT: 36 %
Neutro Abs: 2.6 10*3/uL (ref 1.7–7.7)
Platelets: 212 10*3/uL (ref 150–400)
RBC: 5.15 MIL/uL — ABNORMAL HIGH (ref 3.87–5.11)
RDW: 16.7 % — ABNORMAL HIGH (ref 11.5–15.5)
WBC: 7.1 10*3/uL (ref 4.0–10.5)

## 2017-02-19 LAB — COMPREHENSIVE METABOLIC PANEL
ALBUMIN: 3.6 g/dL (ref 3.5–5.0)
ALK PHOS: 92 U/L (ref 38–126)
ALT: 40 U/L (ref 14–54)
AST: 29 U/L (ref 15–41)
Anion gap: 9 (ref 5–15)
BUN: 9 mg/dL (ref 6–20)
CALCIUM: 8.7 mg/dL — AB (ref 8.9–10.3)
CO2: 21 mmol/L — AB (ref 22–32)
CREATININE: 0.57 mg/dL (ref 0.44–1.00)
Chloride: 105 mmol/L (ref 101–111)
GFR calc Af Amer: >60 mL/min (ref >60)
GFR calc non Af Amer: >60 mL/min (ref >60)
GLUCOSE: 113 mg/dL — AB (ref 65–99)
Potassium: 3.9 mmol/L (ref 3.5–5.1)
SODIUM: 135 mmol/L (ref 135–145)
Total Bilirubin: 0.2 mg/dL — ABNORMAL LOW (ref 0.3–1.2)
Total Protein: 6.8 g/dL (ref 6.5–8.1)

## 2017-02-19 LAB — URINALYSIS, ROUTINE W REFLEX MICROSCOPIC
Bilirubin Urine: NEGATIVE
GLUCOSE, UA: NEGATIVE mg/dL
HGB URINE DIPSTICK: NEGATIVE
Ketones, ur: 5 mg/dL — AB
LEUKOCYTES UA: NEGATIVE
NITRITE: NEGATIVE
PH: 5 (ref 5.0–8.0)
Protein, ur: NEGATIVE mg/dL
SPECIFIC GRAVITY, URINE: 1.028 (ref 1.005–1.030)

## 2017-02-19 LAB — I-STAT BETA HCG BLOOD, ED (MC, WL, AP ONLY)

## 2017-02-19 LAB — LIPASE, BLOOD: Lipase: 15 U/L (ref 11–51)

## 2017-02-19 NOTE — ED Notes (Signed)
Patient transported to ultrasound.

## 2017-02-19 NOTE — ED Provider Notes (Signed)
I evaluated patient regarding her abdominal pain during initial triage. Orders were placed. However, when patient was roomed in ED, she eloped before I could re-evaluate her.   Dietrich PatesKhatri, Dashanna Kinnamon, PA-C 02/19/17 2221    Pricilla LovelessGoldston, Scott, MD 02/19/17 [phone]

## 2017-02-19 NOTE — ED Provider Notes (Signed)
Patient placed in Quick Look pathway, seen and evaluated   Chief Complaint: RUQ abdominal pain, biliary colic  HPI:   Known history of gallbladder stones; scheduled for surgery 2/5 at [facility] but keeps having attacks of abdominal pain. Associated nausea, vomiting especially after eating certain foods. Vomiting mostly at night. Was not put on any medications to help with pain d/t being pregnant during that time. Denies urinary complaints, vaginal complaints, diarrhea, constipation. No relief with Tramadol. No fevers, chills. Feels like gallstone attack pain.  ROS: abdominal pain  Physical Exam:   Gen: No distress  Neuro: Awake and Alert  Skin: Warm    Focused Exam: RUQ and epigastric abdominal pain. Appears uncomfortable. Tachycardic.   Initiation of care has begun. The patient has been counseled on the process, plan, and necessity for staying for the completion/evaluation, and the remainder of the medical screening examination    Dietrich PatesKhatri, Vickie Ponds, PA-C 02/19/17 1525    Raeford RazorKohut, Stephen, MD 02/19/17 [phone]

## 2017-02-19 NOTE — ED Triage Notes (Signed)
Patient states she has gallbladder removal planned for February 5th but reports having gallbladder attacks. C/o nausea and vomiting. Reports symptoms worse at night. Denies fevers.

## 2017-03-12 ENCOUNTER — Ambulatory Visit (INDEPENDENT_AMBULATORY_CARE_PROVIDER_SITE_OTHER): Payer: Medicaid Other | Admitting: *Deleted

## 2017-03-12 ENCOUNTER — Encounter: Payer: Self-pay | Admitting: *Deleted

## 2017-03-12 DIAGNOSIS — Z3201 Encounter for pregnancy test, result positive: Secondary | ICD-10-CM | POA: Diagnosis present

## 2017-03-12 LAB — POCT PREGNANCY, URINE: Preg Test, Ur: POSITIVE — AB

## 2017-03-12 NOTE — Progress Notes (Signed)
Patient presented to clinic for pregnancy test which is positive. Patient already aware of results d/t having surgery cancelled when she tested positive. Patient would like to start pnc here. Unsure of LMP, as she had a miscarriage in December. Scheduled an ultrasound to access dating of pregnancy. Reviewed allergies and medications. Verification of pregnancy letter given.

## 2017-03-19 ENCOUNTER — Ambulatory Visit (HOSPITAL_COMMUNITY): Payer: Medicaid Other | Attending: Family Medicine

## 2017-03-24 ENCOUNTER — Inpatient Hospital Stay (HOSPITAL_COMMUNITY): Payer: Medicaid Other

## 2017-03-24 ENCOUNTER — Inpatient Hospital Stay (HOSPITAL_COMMUNITY)
Admission: AD | Admit: 2017-03-24 | Discharge: 2017-03-24 | Disposition: A | Discharge status: Home / Self Care | Payer: Medicaid Other | Source: Ambulatory Visit | Attending: Obstetrics and Gynecology | Admitting: Obstetrics and Gynecology

## 2017-03-24 ENCOUNTER — Encounter (HOSPITAL_COMMUNITY): Payer: Self-pay | Admitting: Obstetrics and Gynecology

## 2017-03-24 DIAGNOSIS — O99281 Endocrine, nutritional and metabolic diseases complicating pregnancy, first trimester: Secondary | ICD-10-CM | POA: Diagnosis not present

## 2017-03-24 DIAGNOSIS — O26899 Other specified pregnancy related conditions, unspecified trimester: Secondary | ICD-10-CM | POA: Diagnosis present

## 2017-03-24 DIAGNOSIS — O99341 Other mental disorders complicating pregnancy, first trimester: Secondary | ICD-10-CM | POA: Insufficient documentation

## 2017-03-24 DIAGNOSIS — R109 Unspecified abdominal pain: Secondary | ICD-10-CM | POA: Insufficient documentation

## 2017-03-24 DIAGNOSIS — O162 Unspecified maternal hypertension, second trimester: Secondary | ICD-10-CM | POA: Insufficient documentation

## 2017-03-24 DIAGNOSIS — F329 Major depressive disorder, single episode, unspecified: Secondary | ICD-10-CM | POA: Insufficient documentation

## 2017-03-24 DIAGNOSIS — Z3490 Encounter for supervision of normal pregnancy, unspecified, unspecified trimester: Secondary | ICD-10-CM

## 2017-03-24 DIAGNOSIS — Z3A01 Less than 8 weeks gestation of pregnancy: Secondary | ICD-10-CM

## 2017-03-24 DIAGNOSIS — F4310 Post-traumatic stress disorder, unspecified: Secondary | ICD-10-CM | POA: Diagnosis not present

## 2017-03-24 DIAGNOSIS — E039 Hypothyroidism, unspecified: Secondary | ICD-10-CM | POA: Diagnosis not present

## 2017-03-24 DIAGNOSIS — D573 Sickle-cell trait: Secondary | ICD-10-CM | POA: Diagnosis not present

## 2017-03-24 DIAGNOSIS — O26891 Other specified pregnancy related conditions, first trimester: Secondary | ICD-10-CM | POA: Diagnosis not present

## 2017-03-24 LAB — URINALYSIS, ROUTINE W REFLEX MICROSCOPIC
BILIRUBIN URINE: NEGATIVE
GLUCOSE, UA: NEGATIVE mg/dL
Hgb urine dipstick: NEGATIVE
KETONES UR: 5 mg/dL — AB
Leukocytes, UA: NEGATIVE
Nitrite: NEGATIVE
Protein, ur: NEGATIVE mg/dL
Specific Gravity, Urine: 1.026 (ref 1.005–1.030)
pH: 6 (ref 5.0–8.0)

## 2017-03-24 LAB — CBC
HEMATOCRIT: 31.8 % — AB (ref 36.0–46.0)
Hemoglobin: 10.1 g/dL — ABNORMAL LOW (ref 12.0–15.0)
MCH: 21 pg — ABNORMAL LOW (ref 26.0–34.0)
MCHC: 31.8 g/dL (ref 30.0–36.0)
MCV: 66 fL — AB (ref 78.0–100.0)
PLATELETS: 162 10*3/uL (ref 150–400)
RBC: 4.82 MIL/uL (ref 3.87–5.11)
RDW: 17.9 % — ABNORMAL HIGH (ref 11.5–15.5)
WBC: 5.1 10*3/uL (ref 4.0–10.5)

## 2017-03-24 LAB — WET PREP, GENITAL
CLUE CELLS WET PREP: NONE SEEN
SPERM: NONE SEEN
Trich, Wet Prep: NONE SEEN
Yeast Wet Prep HPF POC: NONE SEEN

## 2017-03-24 LAB — HCG, QUANTITATIVE, PREGNANCY: hCG, Beta Chain, Quant, S: 38261 m[IU]/mL — ABNORMAL HIGH (ref <5)

## 2017-03-24 MED ORDER — ACETAMINOPHEN 500 MG PO TABS
1000.0000 mg | ORAL_TABLET | Freq: Once | ORAL | Status: AC
  Administered 2017-03-24: 1000 mg via ORAL
  Filled 2017-03-24: qty 2

## 2017-03-24 NOTE — MAU Note (Signed)
Having some abd cramping and yellow/white vaginal d/c. Vaginal irritation. Vag d/c since THurs and cramping since Sat

## 2017-03-24 NOTE — Progress Notes (Signed)
[facility] by blind swab before u/s
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
## 2017-03-24 NOTE — Progress Notes (Addendum)
[id number]:32 year old Danielle [name] G7 [place] 8M55w5 presented to MAU with C/O abdominal cramping which patient stated started on Thursday 03/21/17. On Saturday pt stated she started having thick odorless beige vaginal discharge with some irritation.   0340: UA, pregnancy test  0408: Wet prep, GC/Chlamydia collected   [phone]: Lab at bedside,CBC drawn  0447: To US  0511: Pt returned from US  0600: Provider at bedside  0617: Tyl 1000 my administered for lower abdominal pain 5/10. We will reevaluate within 1 hr  0647: Pt denies pain.D/C'd home with self, VSS, d/c instruction reviewed, pt verbalized understanding. Ambulated off unit independently. No concerns voiced.

## 2017-03-24 NOTE — Discharge Instructions (Signed)
[facility]  ° ° °[facility]       Phone: [phone] ° °[facility] Phone: [phone] ° °[facility] [place]  Phone: [phone] ° °[facility]  Phone: [phone] ° °[facility]  Phone: [phone] ° °[facility]       Phone: [phone] ° °[facility]    Phone: [phone]  ° °[facility]    Phone: [phone] ° °[facility]    Phone: [phone] ° °[facility]    Phone: [phone] ° °[facility]    Phone: [phone] ° °[facility]       Phone: [phone]  ° °[facility]  Phone: [phone] ° °[facility]    Phone: [phone] ° °[facility]   Phone: [phone] ° °[facility]      Phone: [phone] ° °[facility]    Phone: [phone] ° °Safe Medications in Pregnancy  ° °Acne: °Benzoyl Peroxide °Salicylic Acid ° °Backache/Headache: °Tylenol: 2 regular strength every 4 hours OR °             2 Extra strength every 6 hours ° °Colds/Coughs/Allergies: °Benadryl (alcohol free) 25 mg every 6 hours as needed °Breath right strips °Claritin °Cepacol throat lozenges °Chloraseptic throat spray °Cold-Eeze- up to three times per day °Cough drops, alcohol free °Flonase (by prescription only) °Guaifenesin °Mucinex °Robitussin DM (plain only, alcohol free) °Saline nasal spray/drops °Sudafed (pseudoephedrine) & Actifed ** use only after [redacted] weeks gestation and if you do not have high blood pressure °Tylenol °Vicks Vaporub °Zinc lozenges °Zyrtec  ° °Constipation: °Colace °Ducolax suppositories °Fleet enema °Glycerin suppositories °Metamucil °Milk of  magnesia °Miralax °Senokot °Smooth move tea ° °Diarrhea: °Kaopectate °Imodium A-D ° °*NO pepto Bismol ° °Hemorrhoids: °Anusol °Anusol HC °Preparation H °Tucks ° °Indigestion: °Tums °Maalox °Mylanta °Zantac  °Pepcid ° °Insomnia: °Benadryl (alcohol free) 25mg every 6 hours as needed °Tylenol PM °Unisom, no Gelcaps ° °Leg Cramps: °Tums °MagGel ° °Nausea/Vomiting:  °Bonine °Dramamine °Emetrol °Ginger extract °Sea bands °Meclizine  °Nausea medication to take during pregnancy:  °Unisom (doxylamine succinate 25 mg tablets) Take one tablet daily at bedtime. If symptoms are not adequately controlled, the dose can be increased to a maximum recommended dose of two tablets daily (1/2 tablet in the morning, 1/2 tablet mid-afternoon and one at bedtime). °Vitamin B6 100mg tablets. Take one tablet twice a day (up to 200 mg per day). ° °Skin Rashes: °Aveeno products °Benadryl cream or 25mg every 6 hours as needed °Calamine Lotion °1% cortisone cream ° °Yeast infection: °Gyne-lotrimin 7 °Monistat 7 ° ° °**If taking multiple medications, please check labels to avoid duplicating the same active ingredients °**take medication as directed on the label °** Do not exceed 4000 mg of tylenol in 24 hours °**Do not take medications that contain aspirin or ibuprofen ° ° ° ° °

## 2017-03-25 LAB — GC/CHLAMYDIA PROBE AMP (~~LOC~~) NOT AT ARMC
CHLAMYDIA, DNA PROBE: NEGATIVE
Neisseria Gonorrhea: NEGATIVE

## 2017-04-09 ENCOUNTER — Encounter (INDEPENDENT_AMBULATORY_CARE_PROVIDER_SITE_OTHER): Payer: Self-pay

## 2017-04-10 ENCOUNTER — Telehealth: Payer: Self-pay | Admitting: *Deleted

## 2017-04-10 DIAGNOSIS — B9689 Other specified bacterial agents as the cause of diseases classified elsewhere: Secondary | ICD-10-CM

## 2017-04-10 DIAGNOSIS — N760 Acute vaginitis: Principal | ICD-10-CM

## 2017-04-10 MED ORDER — METRONIDAZOLE 500 MG PO TABS: 500.0000 mg | ORAL_TABLET | Freq: Two times a day (BID) | ORAL | 0 refills | Status: DC

## 2017-04-10 NOTE — Telephone Encounter (Signed)
Lexxie left a voicemail 04/08/17 pm stating she wants to get an antibiotic sent it. States on MyChart she has few bacteria, and she knows when it is many it is treated. States but  A few is a few too many. States yougurt not helping. States she has discharge and thinks is bacterial infection.

## 2017-04-10 NOTE — Telephone Encounter (Signed)
I called Danielle [name] and left a message we got her phone call and MyChart message and am returning her call and will send a detailed MyChart messge.  If you still have questions may call us or send another MyChart message. From chart review has had BV before and from her symptoms will send in a presciption.

## 2017-04-30 ENCOUNTER — Encounter: Payer: Self-pay | Admitting: Family Medicine

## 2017-06-06 ENCOUNTER — Encounter: Payer: Self-pay | Admitting: Obstetrics and Gynecology

## 2017-06-06 ENCOUNTER — Inpatient Hospital Stay (HOSPITAL_COMMUNITY)
Admission: AD | Admit: 2017-06-06 | Discharge: 2017-06-06 | Discharge status: Against Medical Advice | Payer: Medicaid Other | Source: Ambulatory Visit | Attending: Obstetrics & Gynecology | Admitting: Obstetrics & Gynecology

## 2017-06-06 DIAGNOSIS — O26852 Spotting complicating pregnancy, second trimester: Secondary | ICD-10-CM | POA: Diagnosis present

## 2017-06-06 DIAGNOSIS — Z3A17 17 weeks gestation of pregnancy: Secondary | ICD-10-CM | POA: Insufficient documentation

## 2017-06-06 DIAGNOSIS — R109 Unspecified abdominal pain: Secondary | ICD-10-CM

## 2017-06-06 DIAGNOSIS — Z3490 Encounter for supervision of normal pregnancy, unspecified, unspecified trimester: Secondary | ICD-10-CM

## 2017-06-06 DIAGNOSIS — O26899 Other specified pregnancy related conditions, unspecified trimester: Secondary | ICD-10-CM

## 2017-06-06 DIAGNOSIS — Z5321 Procedure and treatment not carried out due to patient leaving prior to being seen by health care provider: Secondary | ICD-10-CM | POA: Diagnosis not present

## 2017-06-06 LAB — URINALYSIS, ROUTINE W REFLEX MICROSCOPIC
BILIRUBIN URINE: NEGATIVE
Bacteria, UA: NONE SEEN
Glucose, UA: NEGATIVE mg/dL
HGB URINE DIPSTICK: NEGATIVE
Ketones, ur: 20 mg/dL — AB
NITRITE: NEGATIVE
PH: 7 (ref 5.0–8.0)
Protein, ur: 30 mg/dL — AB
SPECIFIC GRAVITY, URINE: 1.026 (ref 1.005–1.030)

## 2017-06-06 NOTE — MAU Note (Signed)
Not in lobby x 3 assume pt eloped 

## 2017-06-06 NOTE — MAU Note (Signed)
Pt reports she started spotting yesterday. Reports dull pain on left side. Not started prenatal care.

## 2017-06-06 NOTE — MAU Note (Signed)
Not in Lobby x2

## 2017-06-06 NOTE — MAU Note (Signed)
Not in lobby

## 2017-06-25 ENCOUNTER — Encounter: Payer: Self-pay | Admitting: Certified Nurse Midwife

## 2017-07-03 ENCOUNTER — Other Ambulatory Visit: Payer: Self-pay | Admitting: Certified Nurse Midwife

## 2017-07-03 ENCOUNTER — Other Ambulatory Visit (HOSPITAL_COMMUNITY)
Admission: RE | Admit: 2017-07-03 | Discharge: 2017-07-03 | Disposition: A | Discharge status: Home / Self Care | Payer: Medicaid Other | Source: Ambulatory Visit | Attending: Certified Nurse Midwife | Admitting: Certified Nurse Midwife

## 2017-07-03 ENCOUNTER — Encounter: Payer: Self-pay | Admitting: Certified Nurse Midwife

## 2017-07-03 ENCOUNTER — Ambulatory Visit (INDEPENDENT_AMBULATORY_CARE_PROVIDER_SITE_OTHER): Payer: Medicaid Other | Admitting: Certified Nurse Midwife

## 2017-07-03 VITALS — BP 133/78 | HR 90 | Wt 239.9 lb

## 2017-07-03 DIAGNOSIS — B373 Candidiasis of vulva and vagina: Secondary | ICD-10-CM | POA: Diagnosis not present

## 2017-07-03 DIAGNOSIS — N898 Other specified noninflammatory disorders of vagina: Secondary | ICD-10-CM | POA: Diagnosis present

## 2017-07-03 DIAGNOSIS — O98812 Other maternal infectious and parasitic diseases complicating pregnancy, second trimester: Secondary | ICD-10-CM | POA: Insufficient documentation

## 2017-07-03 DIAGNOSIS — O26892 Other specified pregnancy related conditions, second trimester: Secondary | ICD-10-CM

## 2017-07-03 DIAGNOSIS — O26619 Liver and biliary tract disorders in pregnancy, unspecified trimester: Secondary | ICD-10-CM

## 2017-07-03 DIAGNOSIS — O0990 Supervision of high risk pregnancy, unspecified, unspecified trimester: Secondary | ICD-10-CM | POA: Insufficient documentation

## 2017-07-03 DIAGNOSIS — Z8759 Personal history of other complications of pregnancy, childbirth and the puerperium: Secondary | ICD-10-CM

## 2017-07-03 DIAGNOSIS — Z8659 Personal history of other mental and behavioral disorders: Secondary | ICD-10-CM

## 2017-07-03 DIAGNOSIS — R109 Unspecified abdominal pain: Secondary | ICD-10-CM

## 2017-07-03 DIAGNOSIS — Z3A20 20 weeks gestation of pregnancy: Secondary | ICD-10-CM | POA: Insufficient documentation

## 2017-07-03 DIAGNOSIS — O0932 Supervision of pregnancy with insufficient antenatal care, second trimester: Secondary | ICD-10-CM

## 2017-07-03 DIAGNOSIS — Z3480 Encounter for supervision of other normal pregnancy, unspecified trimester: Secondary | ICD-10-CM

## 2017-07-03 DIAGNOSIS — N9089 Other specified noninflammatory disorders of vulva and perineum: Secondary | ICD-10-CM

## 2017-07-03 DIAGNOSIS — O219 Vomiting of pregnancy, unspecified: Secondary | ICD-10-CM

## 2017-07-03 DIAGNOSIS — Z3482 Encounter for supervision of other normal pregnancy, second trimester: Secondary | ICD-10-CM | POA: Diagnosis not present

## 2017-07-03 DIAGNOSIS — B9689 Other specified bacterial agents as the cause of diseases classified elsewhere: Secondary | ICD-10-CM | POA: Insufficient documentation

## 2017-07-03 DIAGNOSIS — Z641 Problems related to multiparity: Secondary | ICD-10-CM

## 2017-07-03 DIAGNOSIS — K8020 Calculus of gallbladder without cholecystitis without obstruction: Secondary | ICD-10-CM

## 2017-07-03 DIAGNOSIS — Z91410 Personal history of adult physical and sexual abuse: Secondary | ICD-10-CM

## 2017-07-03 DIAGNOSIS — Z3490 Encounter for supervision of normal pregnancy, unspecified, unspecified trimester: Secondary | ICD-10-CM

## 2017-07-03 DIAGNOSIS — O26899 Other specified pregnancy related conditions, unspecified trimester: Secondary | ICD-10-CM

## 2017-07-03 MED ORDER — PRENATE PIXIE 10-0.6-0.4-200 MG PO CAPS
1.0000 | ORAL_CAPSULE | Freq: Every day | ORAL | 12 refills | Status: DC
Start: 2017-07-03 — End: 2017-08-28

## 2017-07-03 MED ORDER — ASPIRIN 81 MG PO CHEW: 81.0000 mg | CHEWABLE_TABLET | Freq: Every day | ORAL | 12 refills | Status: DC

## 2017-07-03 MED ORDER — ONDANSETRON 8 MG PO TBDP: 8.0000 mg | ORAL_TABLET | Freq: Three times a day (TID) | ORAL | 0 refills | Status: DC | PRN

## 2017-07-03 MED ORDER — DOXYLAMINE-PYRIDOXINE 10-10 MG PO TBEC: DELAYED_RELEASE_TABLET | ORAL | 4 refills | Status: DC
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
## 2017-07-03 NOTE — Progress Notes (Signed)
Skin Tag Removal Procedure Note    PROCEDURE: Skin tag removal Performing Provider: Orvilla Cornwall CNM   Patient education prior to procedure, explained risk, benefits of skin tag removal, reviewed alternative options. Patient reported understanding. Gave consent to continue with procedure.   PROCEDURE:  Pregnancy Text :  not indicated       Sterile Preparation:   Betadinex3    The patient's right labia was palpated and the skin tag located. The area was prepped with Betadinex3. The area around the skin tag was anesthestized with 1.5 cc of 1% lidocaine without epinephrine was injected. A 1.5 mm incision was made and the skin tag was removed in an intact manner. Skin tag sent to pathology.  Hemostasis.     Followup: The patient tolerated the procedure well without complications.  Standard post-procedure care is explained and return precautions are given.  Orvilla Cornwall CNM

## 2017-07-04 LAB — CERVICOVAGINAL ANCILLARY ONLY
BACTERIAL VAGINITIS: NEGATIVE
CANDIDA VAGINITIS: POSITIVE — AB
Chlamydia: NEGATIVE
Neisseria Gonorrhea: NEGATIVE
TRICH (WINDOWPATH): NEGATIVE

## 2017-07-04 LAB — PROTEIN / CREATININE RATIO, URINE
Creatinine, Urine: 320.4 mg/dL
Protein, Ur: 62.2 mg/dL
Protein/Creat Ratio: 194 mg/g creat (ref 0–200)

## 2017-07-05 LAB — AFP, SERUM, OPEN SPINA BIFIDA
AFP MOM: 0.83
AFP Value: 38.2 ng/mL
Gest. Age on Collection Date: 20 weeks
Maternal Age At EDD: 33.5 yr
OSBR RISK 1 IN: 10000
TEST RESULTS AFP: NEGATIVE
Weight: 239 [lb_av]

## 2017-07-05 LAB — URINE CULTURE, OB REFLEX

## 2017-07-05 LAB — BILE ACIDS, TOTAL: BILE ACIDS TOTAL: 14.3 umol/L — AB (ref 4.7–24.5)

## 2017-07-05 LAB — CULTURE, OB URINE

## 2017-07-06 LAB — OBSTETRIC PANEL, INCLUDING HIV
ANTIBODY SCREEN: NEGATIVE
BASOS: 0 %
Basophils Absolute: 0 10*3/uL (ref 0.0–0.2)
EOS (ABSOLUTE): 0.1 10*3/uL (ref 0.0–0.4)
EOS: 1 %
HEMATOCRIT: 32.8 % — AB (ref 34.0–46.6)
HEMOGLOBIN: 10.2 g/dL — AB (ref 11.1–15.9)
HIV SCREEN 4TH GENERATION: NONREACTIVE
Hepatitis B Surface Ag: NEGATIVE
IMMATURE GRANS (ABS): 0 10*3/uL (ref 0.0–0.1)
Immature Granulocytes: 0 %
LYMPHS: 32 %
Lymphocytes Absolute: 2.4 10*3/uL (ref 0.7–3.1)
MCH: 21.7 pg — ABNORMAL LOW (ref 26.6–33.0)
MCHC: 31.1 g/dL — AB (ref 31.5–35.7)
MCV: 70 fL — ABNORMAL LOW (ref 79–97)
MONOCYTES: 8 %
Monocytes Absolute: 0.6 10*3/uL (ref 0.1–0.9)
NEUTROS ABS: 4.4 10*3/uL (ref 1.4–7.0)
Neutrophils: 59 %
Platelets: 176 10*3/uL (ref 150–450)
RBC: 4.7 x10E6/uL (ref 3.77–5.28)
RDW: 19.1 % — AB (ref 12.3–15.4)
RH TYPE: POSITIVE
RPR Ser Ql: NONREACTIVE
RUBELLA: 1.67 {index} (ref >0.99)
WBC: 7.5 10*3/uL (ref 3.4–10.8)

## 2017-07-06 LAB — HEMOGLOBINOPATHY EVALUATION
HGB A: 97.7 % (ref 96.4–98.8)
HGB C: 0 %
HGB S: 0 %
HGB VARIANT: 0 %
Hemoglobin A2 Quantitation: 2.3 % (ref 1.8–3.2)
Hemoglobin F Quantitation: 0 % (ref 0.0–2.0)

## 2017-07-06 LAB — COMPREHENSIVE METABOLIC PANEL
ALBUMIN: 3.9 g/dL (ref 3.5–5.5)
ALK PHOS: 52 IU/L (ref 39–117)
ALT: 6 IU/L (ref 0–32)
AST: 11 IU/L (ref 0–40)
Albumin/Globulin Ratio: 1.5 (ref 1.2–2.2)
BUN / CREAT RATIO: 15 (ref 9–23)
BUN: 6 mg/dL (ref 6–20)
Bilirubin Total: 0.2 mg/dL (ref 0.0–1.2)
CALCIUM: 9.4 mg/dL (ref 8.7–10.2)
CO2: 20 mmol/L (ref 20–29)
CREATININE: 0.39 mg/dL — AB (ref 0.57–1.00)
Chloride: 102 mmol/L (ref 96–106)
GFR, EST AFRICAN AMERICAN: 160 mL/min/{1.73_m2} (ref >59)
GFR, EST NON AFRICAN AMERICAN: 138 mL/min/{1.73_m2} (ref >59)
GLOBULIN, TOTAL: 2.6 g/dL (ref 1.5–4.5)
GLUCOSE: 77 mg/dL (ref 65–99)
Potassium: 3.7 mmol/L (ref 3.5–5.2)
Sodium: 137 mmol/L (ref 134–144)
TOTAL PROTEIN: 6.5 g/dL (ref 6.0–8.5)

## 2017-07-06 LAB — HEMOGLOBIN A1C
Est. average glucose Bld gHb Est-mCnc: 105 mg/dL
Hgb A1c MFr Bld: 5.3 % (ref 4.8–5.6)

## 2017-07-06 LAB — VITAMIN D 25 HYDROXY (VIT D DEFICIENCY, FRACTURES): Vit D, 25-Hydroxy: 7.8 ng/mL — ABNORMAL LOW (ref 30.0–100.0)

## 2017-07-09 ENCOUNTER — Telehealth: Payer: Self-pay | Admitting: *Deleted

## 2017-07-09 ENCOUNTER — Other Ambulatory Visit: Payer: Self-pay | Admitting: Certified Nurse Midwife

## 2017-07-09 ENCOUNTER — Ambulatory Visit (HOSPITAL_COMMUNITY)
Admission: RE | Admit: 2017-07-09 | Discharge: 2017-07-09 | Disposition: A | Discharge status: Home / Self Care | Payer: Medicaid Other | Source: Ambulatory Visit | Attending: Certified Nurse Midwife | Admitting: Certified Nurse Midwife

## 2017-07-09 ENCOUNTER — Encounter: Payer: Self-pay | Admitting: Certified Nurse Midwife

## 2017-07-09 ENCOUNTER — Encounter (HOSPITAL_COMMUNITY): Payer: Self-pay

## 2017-07-09 DIAGNOSIS — O09292 Supervision of pregnancy with other poor reproductive or obstetric history, second trimester: Secondary | ICD-10-CM | POA: Insufficient documentation

## 2017-07-09 DIAGNOSIS — O0932 Supervision of pregnancy with insufficient antenatal care, second trimester: Secondary | ICD-10-CM | POA: Insufficient documentation

## 2017-07-09 DIAGNOSIS — D573 Sickle-cell trait: Secondary | ICD-10-CM

## 2017-07-09 DIAGNOSIS — R109 Unspecified abdominal pain: Secondary | ICD-10-CM

## 2017-07-09 DIAGNOSIS — Z362 Encounter for other antenatal screening follow-up: Secondary | ICD-10-CM | POA: Diagnosis present

## 2017-07-09 DIAGNOSIS — O0930 Supervision of pregnancy with insufficient antenatal care, unspecified trimester: Secondary | ICD-10-CM

## 2017-07-09 DIAGNOSIS — O09299 Supervision of pregnancy with other poor reproductive or obstetric history, unspecified trimester: Secondary | ICD-10-CM

## 2017-07-09 DIAGNOSIS — O99212 Obesity complicating pregnancy, second trimester: Secondary | ICD-10-CM | POA: Insufficient documentation

## 2017-07-09 DIAGNOSIS — Z3A21 21 weeks gestation of pregnancy: Secondary | ICD-10-CM

## 2017-07-09 DIAGNOSIS — Z3480 Encounter for supervision of other normal pregnancy, unspecified trimester: Secondary | ICD-10-CM

## 2017-07-09 DIAGNOSIS — Z3490 Encounter for supervision of normal pregnancy, unspecified, unspecified trimester: Secondary | ICD-10-CM

## 2017-07-09 DIAGNOSIS — O26899 Other specified pregnancy related conditions, unspecified trimester: Secondary | ICD-10-CM

## 2017-07-09 DIAGNOSIS — O99012 Anemia complicating pregnancy, second trimester: Secondary | ICD-10-CM | POA: Diagnosis not present

## 2017-07-09 LAB — CYTOLOGY - PAP
Diagnosis: NEGATIVE
HPV: NOT DETECTED

## 2017-07-09 LAB — CYSTIC FIBROSIS MUTATION 97: GENE DIS ANAL CARRIER INTERP BLD/T-IMP: NOT DETECTED

## 2017-07-09 NOTE — Telephone Encounter (Signed)
error 

## 2017-07-10 ENCOUNTER — Other Ambulatory Visit: Payer: Self-pay | Admitting: Certified Nurse Midwife

## 2017-07-10 ENCOUNTER — Other Ambulatory Visit (HOSPITAL_COMMUNITY): Payer: Self-pay | Admitting: *Deleted

## 2017-07-10 DIAGNOSIS — O0990 Supervision of high risk pregnancy, unspecified, unspecified trimester: Secondary | ICD-10-CM

## 2017-07-10 DIAGNOSIS — Z362 Encounter for other antenatal screening follow-up: Secondary | ICD-10-CM

## 2017-07-10 DIAGNOSIS — E559 Vitamin D deficiency, unspecified: Secondary | ICD-10-CM

## 2017-07-10 DIAGNOSIS — B3731 Acute candidiasis of vulva and vagina: Secondary | ICD-10-CM

## 2017-07-10 DIAGNOSIS — O99012 Anemia complicating pregnancy, second trimester: Secondary | ICD-10-CM

## 2017-07-10 DIAGNOSIS — B373 Candidiasis of vulva and vagina: Secondary | ICD-10-CM

## 2017-07-10 MED ORDER — TERCONAZOLE 0.8 % VA CREA: 1.0000 | TOPICAL_CREAM | Freq: Every day | VAGINAL | 0 refills | Status: DC

## 2017-07-10 MED ORDER — FLUCONAZOLE 150 MG PO TABS: 150.0000 mg | ORAL_TABLET | Freq: Once | ORAL | 0 refills | Status: AC

## 2017-07-10 MED ORDER — CITRANATAL BLOOM 90-1 MG PO TABS: 1.0000 | ORAL_TABLET | Freq: Every day | ORAL | 12 refills | Status: DC

## 2017-07-10 MED ORDER — VITAMIN D (ERGOCALCIFEROL) 1.25 MG (50000 UNIT) PO CAPS: 50000.0000 [IU] | ORAL_CAPSULE | ORAL | 2 refills | Status: DC

## 2017-07-11 ENCOUNTER — Encounter: Payer: Self-pay | Admitting: *Deleted

## 2017-07-12 ENCOUNTER — Other Ambulatory Visit: Payer: Self-pay | Admitting: Certified Nurse Midwife

## 2017-07-12 DIAGNOSIS — O0990 Supervision of high risk pregnancy, unspecified, unspecified trimester: Secondary | ICD-10-CM

## 2017-07-17 ENCOUNTER — Other Ambulatory Visit: Payer: Self-pay | Admitting: Certified Nurse Midwife

## 2017-07-17 ENCOUNTER — Encounter: Payer: Self-pay | Admitting: Obstetrics and Gynecology

## 2017-07-17 ENCOUNTER — Encounter: Payer: Self-pay | Admitting: Certified Nurse Midwife

## 2017-07-17 DIAGNOSIS — E039 Hypothyroidism, unspecified: Secondary | ICD-10-CM | POA: Insufficient documentation

## 2017-07-17 DIAGNOSIS — O99280 Endocrine, nutritional and metabolic diseases complicating pregnancy, unspecified trimester: Secondary | ICD-10-CM

## 2017-07-17 DIAGNOSIS — O10919 Unspecified pre-existing hypertension complicating pregnancy, unspecified trimester: Secondary | ICD-10-CM | POA: Insufficient documentation

## 2017-07-23 ENCOUNTER — Encounter (INDEPENDENT_AMBULATORY_CARE_PROVIDER_SITE_OTHER): Payer: Self-pay

## 2017-08-20 ENCOUNTER — Other Ambulatory Visit (HOSPITAL_COMMUNITY): Payer: Self-pay | Admitting: Maternal and Fetal Medicine

## 2017-08-20 ENCOUNTER — Ambulatory Visit (HOSPITAL_COMMUNITY)
Admission: RE | Admit: 2017-08-20 | Discharge: 2017-08-20 | Disposition: A | Discharge status: Home / Self Care | Payer: Medicaid Other | Source: Ambulatory Visit | Attending: Certified Nurse Midwife | Admitting: Certified Nurse Midwife

## 2017-08-20 DIAGNOSIS — Z0489 Encounter for examination and observation for other specified reasons: Secondary | ICD-10-CM

## 2017-08-20 DIAGNOSIS — O09292 Supervision of pregnancy with other poor reproductive or obstetric history, second trimester: Secondary | ICD-10-CM | POA: Diagnosis not present

## 2017-08-20 DIAGNOSIS — Z3A27 27 weeks gestation of pregnancy: Secondary | ICD-10-CM | POA: Diagnosis not present

## 2017-08-20 DIAGNOSIS — O99212 Obesity complicating pregnancy, second trimester: Secondary | ICD-10-CM | POA: Diagnosis not present

## 2017-08-20 DIAGNOSIS — Z862 Personal history of diseases of the blood and blood-forming organs and certain disorders involving the immune mechanism: Secondary | ICD-10-CM | POA: Diagnosis not present

## 2017-08-20 DIAGNOSIS — O0932 Supervision of pregnancy with insufficient antenatal care, second trimester: Secondary | ICD-10-CM | POA: Diagnosis not present

## 2017-08-20 DIAGNOSIS — Z362 Encounter for other antenatal screening follow-up: Secondary | ICD-10-CM | POA: Diagnosis present

## 2017-08-20 DIAGNOSIS — IMO0002 Reserved for concepts with insufficient information to code with codable children: Secondary | ICD-10-CM

## 2017-08-28 ENCOUNTER — Other Ambulatory Visit: Payer: Self-pay

## 2017-08-28 ENCOUNTER — Encounter (HOSPITAL_COMMUNITY): Payer: Self-pay

## 2017-08-28 ENCOUNTER — Inpatient Hospital Stay (HOSPITAL_COMMUNITY)
Admission: AD | Admit: 2017-08-28 | Discharge: 2017-08-28 | Disposition: A | Discharge status: Home / Self Care | Payer: Medicaid Other | Source: Ambulatory Visit | Attending: Obstetrics and Gynecology | Admitting: Obstetrics and Gynecology

## 2017-08-28 DIAGNOSIS — O10919 Unspecified pre-existing hypertension complicating pregnancy, unspecified trimester: Secondary | ICD-10-CM

## 2017-08-28 DIAGNOSIS — R42 Dizziness and giddiness: Secondary | ICD-10-CM | POA: Diagnosis present

## 2017-08-28 DIAGNOSIS — O10913 Unspecified pre-existing hypertension complicating pregnancy, third trimester: Secondary | ICD-10-CM | POA: Diagnosis not present

## 2017-08-28 DIAGNOSIS — O99283 Endocrine, nutritional and metabolic diseases complicating pregnancy, third trimester: Secondary | ICD-10-CM | POA: Diagnosis not present

## 2017-08-28 DIAGNOSIS — F329 Major depressive disorder, single episode, unspecified: Secondary | ICD-10-CM | POA: Insufficient documentation

## 2017-08-28 DIAGNOSIS — Z8249 Family history of ischemic heart disease and other diseases of the circulatory system: Secondary | ICD-10-CM | POA: Diagnosis not present

## 2017-08-28 DIAGNOSIS — O26893 Other specified pregnancy related conditions, third trimester: Secondary | ICD-10-CM

## 2017-08-28 DIAGNOSIS — R519 Headache, unspecified: Secondary | ICD-10-CM

## 2017-08-28 DIAGNOSIS — O26899 Other specified pregnancy related conditions, unspecified trimester: Secondary | ICD-10-CM

## 2017-08-28 DIAGNOSIS — O99353 Diseases of the nervous system complicating pregnancy, third trimester: Secondary | ICD-10-CM | POA: Diagnosis not present

## 2017-08-28 DIAGNOSIS — O99343 Other mental disorders complicating pregnancy, third trimester: Secondary | ICD-10-CM | POA: Insufficient documentation

## 2017-08-28 DIAGNOSIS — Z7982 Long term (current) use of aspirin: Secondary | ICD-10-CM | POA: Insufficient documentation

## 2017-08-28 DIAGNOSIS — G43909 Migraine, unspecified, not intractable, without status migrainosus: Secondary | ICD-10-CM | POA: Diagnosis not present

## 2017-08-28 DIAGNOSIS — R51 Headache: Secondary | ICD-10-CM

## 2017-08-28 DIAGNOSIS — Z3A28 28 weeks gestation of pregnancy: Secondary | ICD-10-CM

## 2017-08-28 DIAGNOSIS — F4310 Post-traumatic stress disorder, unspecified: Secondary | ICD-10-CM | POA: Diagnosis not present

## 2017-08-28 DIAGNOSIS — O163 Unspecified maternal hypertension, third trimester: Secondary | ICD-10-CM | POA: Insufficient documentation

## 2017-08-28 LAB — COMPREHENSIVE METABOLIC PANEL
ALBUMIN: 3 g/dL — AB (ref 3.5–5.0)
ALT: 9 U/L (ref 0–44)
ANION GAP: 7 (ref 5–15)
AST: 14 U/L — AB (ref 15–41)
Alkaline Phosphatase: 56 U/L (ref 38–126)
BILIRUBIN TOTAL: 0.3 mg/dL (ref 0.3–1.2)
BUN: 7 mg/dL (ref 6–20)
CHLORIDE: 105 mmol/L (ref 98–111)
CO2: 22 mmol/L (ref 22–32)
Calcium: 8.7 mg/dL — ABNORMAL LOW (ref 8.9–10.3)
Creatinine, Ser: 0.46 mg/dL (ref 0.44–1.00)
GFR calc Af Amer: >60 mL/min (ref >60)
GFR calc non Af Amer: >60 mL/min (ref >60)
GLUCOSE: 94 mg/dL (ref 70–99)
POTASSIUM: 4.3 mmol/L (ref 3.5–5.1)
SODIUM: 134 mmol/L — AB (ref 135–145)
TOTAL PROTEIN: 6.2 g/dL — AB (ref 6.5–8.1)

## 2017-08-28 LAB — URINALYSIS, ROUTINE W REFLEX MICROSCOPIC
Bilirubin Urine: NEGATIVE
GLUCOSE, UA: NEGATIVE mg/dL
HGB URINE DIPSTICK: NEGATIVE
KETONES UR: NEGATIVE mg/dL
LEUKOCYTES UA: NEGATIVE
NITRITE: NEGATIVE
PROTEIN: 30 mg/dL — AB
SPECIFIC GRAVITY, URINE: 1.029 (ref 1.005–1.030)
pH: 5 (ref 5.0–8.0)

## 2017-08-28 LAB — CBC
HEMATOCRIT: 28.7 % — AB (ref 36.0–46.0)
Hemoglobin: 9.1 g/dL — ABNORMAL LOW (ref 12.0–15.0)
MCH: 20.9 pg — ABNORMAL LOW (ref 26.0–34.0)
MCHC: 31.7 g/dL (ref 30.0–36.0)
MCV: 65.8 fL — ABNORMAL LOW (ref 78.0–100.0)
PLATELETS: 162 10*3/uL (ref 150–400)
RBC: 4.36 MIL/uL (ref 3.87–5.11)
RDW: 17.5 % — AB (ref 11.5–15.5)
WBC: 6.7 10*3/uL (ref 4.0–10.5)

## 2017-08-28 LAB — PROTEIN / CREATININE RATIO, URINE
Creatinine, Urine: 321 mg/dL
PROTEIN CREATININE RATIO: 0.17 mg/mg{creat} — AB (ref 0.00–0.15)
Total Protein, Urine: 53 mg/dL

## 2017-08-28 MED ORDER — CYCLOBENZAPRINE HCL 10 MG PO TABS
10.0000 mg | ORAL_TABLET | Freq: Once | ORAL | Status: AC
  Administered 2017-08-28: 10 mg via ORAL
  Filled 2017-08-28: qty 1

## 2017-08-28 MED ORDER — CYCLOBENZAPRINE HCL 10 MG PO TABS: 10.0000 mg | ORAL_TABLET | Freq: Two times a day (BID) | ORAL | 0 refills | Status: DC | PRN

## 2017-08-28 MED ORDER — IBUPROFEN 600 MG PO TABS
600.0000 mg | ORAL_TABLET | Freq: Once | ORAL | Status: AC
  Administered 2017-08-28: 600 mg via ORAL
  Filled 2017-08-28: qty 1
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
## 2017-08-28 NOTE — Discharge Instructions (Signed)

## 2017-08-28 NOTE — MAU Note (Signed)
Pt presents to MAU with c/o dizziness and headache x 2 days. Pt has tried taking Tylenol and it has given her no relief. Has Hx of Preeclampsia with previous pregnancies. Pt denies blurred vision/floaters, VB and LOF. +FM

## 2017-10-08 ENCOUNTER — Ambulatory Visit (INDEPENDENT_AMBULATORY_CARE_PROVIDER_SITE_OTHER): Payer: Medicaid Other | Admitting: Family Medicine

## 2017-10-08 ENCOUNTER — Other Ambulatory Visit: Payer: Self-pay

## 2017-10-08 VITALS — BP 131/73 | HR 97 | Wt 243.1 lb

## 2017-10-08 DIAGNOSIS — O26613 Liver and biliary tract disorders in pregnancy, third trimester: Secondary | ICD-10-CM

## 2017-10-08 DIAGNOSIS — O99283 Endocrine, nutritional and metabolic diseases complicating pregnancy, third trimester: Secondary | ICD-10-CM

## 2017-10-08 DIAGNOSIS — O10919 Unspecified pre-existing hypertension complicating pregnancy, unspecified trimester: Secondary | ICD-10-CM

## 2017-10-08 DIAGNOSIS — E039 Hypothyroidism, unspecified: Secondary | ICD-10-CM

## 2017-10-08 DIAGNOSIS — O0993 Supervision of high risk pregnancy, unspecified, third trimester: Secondary | ICD-10-CM

## 2017-10-08 DIAGNOSIS — O26619 Liver and biliary tract disorders in pregnancy, unspecified trimester: Secondary | ICD-10-CM

## 2017-10-08 DIAGNOSIS — O0990 Supervision of high risk pregnancy, unspecified, unspecified trimester: Secondary | ICD-10-CM

## 2017-10-08 DIAGNOSIS — O26649 Intrahepatic cholestasis of pregnancy, unspecified trimester: Secondary | ICD-10-CM

## 2017-10-08 DIAGNOSIS — K831 Obstruction of bile duct: Secondary | ICD-10-CM

## 2017-10-08 DIAGNOSIS — O10913 Unspecified pre-existing hypertension complicating pregnancy, third trimester: Secondary | ICD-10-CM

## 2017-10-08 NOTE — Progress Notes (Signed)
Patient declined FLU & TDAP 10/08/17. No complaints today

## 2017-10-08 NOTE — Patient Instructions (Signed)
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
## 2017-10-10 ENCOUNTER — Ambulatory Visit (HOSPITAL_COMMUNITY)
Admission: RE | Admit: 2017-10-10 | Discharge: 2017-10-10 | Disposition: A | Discharge status: Home / Self Care | Payer: Medicaid Other | Source: Ambulatory Visit | Attending: Family Medicine | Admitting: Family Medicine

## 2017-10-10 ENCOUNTER — Encounter: Payer: Self-pay | Admitting: Family Medicine

## 2017-10-10 ENCOUNTER — Encounter (HOSPITAL_COMMUNITY): Payer: Self-pay

## 2017-10-10 ENCOUNTER — Other Ambulatory Visit (HOSPITAL_COMMUNITY): Payer: Self-pay | Admitting: *Deleted

## 2017-10-10 DIAGNOSIS — K831 Obstruction of bile duct: Secondary | ICD-10-CM

## 2017-10-10 DIAGNOSIS — Z3A34 34 weeks gestation of pregnancy: Secondary | ICD-10-CM | POA: Insufficient documentation

## 2017-10-10 DIAGNOSIS — O26619 Liver and biliary tract disorders in pregnancy, unspecified trimester: Secondary | ICD-10-CM

## 2017-10-10 DIAGNOSIS — O99213 Obesity complicating pregnancy, third trimester: Secondary | ICD-10-CM

## 2017-10-10 DIAGNOSIS — O409XX0 Polyhydramnios, unspecified trimester, not applicable or unspecified: Secondary | ICD-10-CM

## 2017-10-10 DIAGNOSIS — O26613 Liver and biliary tract disorders in pregnancy, third trimester: Secondary | ICD-10-CM

## 2017-10-10 DIAGNOSIS — Z362 Encounter for other antenatal screening follow-up: Secondary | ICD-10-CM

## 2017-10-10 LAB — HEMOGLOBIN A1C
Est. average glucose Bld gHb Est-mCnc: 108 mg/dL
Hgb A1c MFr Bld: 5.4 % (ref 4.8–5.6)

## 2017-10-10 LAB — BILE ACIDS, TOTAL: Bile Acids Total: 5.2 umol/L (ref 0.0–10.0)

## 2017-10-10 LAB — TSH: TSH: 2.54 u[IU]/mL (ref 0.450–4.500)

## 2017-10-15 ENCOUNTER — Other Ambulatory Visit: Payer: Medicaid Other

## 2017-10-17 ENCOUNTER — Ambulatory Visit (HOSPITAL_COMMUNITY)
Admission: RE | Admit: 2017-10-17 | Discharge: 2017-10-17 | Disposition: A | Discharge status: Home / Self Care | Payer: Medicaid Other | Source: Ambulatory Visit | Attending: Family Medicine | Admitting: Family Medicine

## 2017-10-17 ENCOUNTER — Encounter (HOSPITAL_COMMUNITY): Payer: Self-pay

## 2017-10-17 DIAGNOSIS — O409XX0 Polyhydramnios, unspecified trimester, not applicable or unspecified: Secondary | ICD-10-CM

## 2017-10-17 DIAGNOSIS — Z3A35 35 weeks gestation of pregnancy: Secondary | ICD-10-CM | POA: Diagnosis not present

## 2017-10-17 DIAGNOSIS — O09293 Supervision of pregnancy with other poor reproductive or obstetric history, third trimester: Secondary | ICD-10-CM | POA: Diagnosis not present

## 2017-10-17 DIAGNOSIS — K831 Obstruction of bile duct: Secondary | ICD-10-CM

## 2017-10-17 DIAGNOSIS — O403XX0 Polyhydramnios, third trimester, not applicable or unspecified: Secondary | ICD-10-CM | POA: Insufficient documentation

## 2017-10-17 DIAGNOSIS — O99213 Obesity complicating pregnancy, third trimester: Secondary | ICD-10-CM

## 2017-10-17 DIAGNOSIS — O26613 Liver and biliary tract disorders in pregnancy, third trimester: Secondary | ICD-10-CM | POA: Diagnosis not present

## 2017-10-18 ENCOUNTER — Other Ambulatory Visit: Payer: Self-pay

## 2017-10-21 ENCOUNTER — Other Ambulatory Visit (HOSPITAL_COMMUNITY): Payer: Self-pay | Admitting: *Deleted

## 2017-10-21 DIAGNOSIS — O409XX0 Polyhydramnios, unspecified trimester, not applicable or unspecified: Secondary | ICD-10-CM

## 2017-10-23 ENCOUNTER — Other Ambulatory Visit: Payer: Medicaid Other

## 2017-10-23 ENCOUNTER — Ambulatory Visit (HOSPITAL_COMMUNITY)
Admission: RE | Admit: 2017-10-23 | Discharge: 2017-10-23 | Disposition: A | Discharge status: Home / Self Care | Payer: Medicaid Other | Source: Ambulatory Visit | Attending: Family Medicine | Admitting: Family Medicine

## 2017-10-23 ENCOUNTER — Other Ambulatory Visit (HOSPITAL_COMMUNITY)
Admission: RE | Admit: 2017-10-23 | Discharge: 2017-10-23 | Disposition: A | Discharge status: Home / Self Care | Payer: Medicaid Other | Source: Ambulatory Visit | Attending: Obstetrics & Gynecology | Admitting: Obstetrics & Gynecology

## 2017-10-23 ENCOUNTER — Encounter: Payer: Self-pay | Admitting: Obstetrics and Gynecology

## 2017-10-23 ENCOUNTER — Encounter (HOSPITAL_COMMUNITY): Payer: Self-pay

## 2017-10-23 ENCOUNTER — Ambulatory Visit (INDEPENDENT_AMBULATORY_CARE_PROVIDER_SITE_OTHER): Payer: Medicaid Other | Admitting: Obstetrics & Gynecology

## 2017-10-23 ENCOUNTER — Telehealth (HOSPITAL_COMMUNITY): Payer: Self-pay | Admitting: *Deleted

## 2017-10-23 ENCOUNTER — Other Ambulatory Visit (HOSPITAL_COMMUNITY): Payer: Self-pay

## 2017-10-23 VITALS — BP 105/71 | HR 105 | Wt 245.5 lb

## 2017-10-23 DIAGNOSIS — O0990 Supervision of high risk pregnancy, unspecified, unspecified trimester: Secondary | ICD-10-CM | POA: Diagnosis present

## 2017-10-23 DIAGNOSIS — O26613 Liver and biliary tract disorders in pregnancy, third trimester: Secondary | ICD-10-CM

## 2017-10-23 DIAGNOSIS — D573 Sickle-cell trait: Secondary | ICD-10-CM | POA: Insufficient documentation

## 2017-10-23 DIAGNOSIS — O99213 Obesity complicating pregnancy, third trimester: Secondary | ICD-10-CM

## 2017-10-23 DIAGNOSIS — E669 Obesity, unspecified: Secondary | ICD-10-CM | POA: Diagnosis not present

## 2017-10-23 DIAGNOSIS — O09293 Supervision of pregnancy with other poor reproductive or obstetric history, third trimester: Secondary | ICD-10-CM

## 2017-10-23 DIAGNOSIS — K831 Obstruction of bile duct: Secondary | ICD-10-CM

## 2017-10-23 DIAGNOSIS — O99013 Anemia complicating pregnancy, third trimester: Secondary | ICD-10-CM | POA: Insufficient documentation

## 2017-10-23 DIAGNOSIS — O26619 Liver and biliary tract disorders in pregnancy, unspecified trimester: Secondary | ICD-10-CM

## 2017-10-23 DIAGNOSIS — Z3A36 36 weeks gestation of pregnancy: Secondary | ICD-10-CM | POA: Diagnosis not present

## 2017-10-23 DIAGNOSIS — O401XX0 Polyhydramnios, first trimester, not applicable or unspecified: Secondary | ICD-10-CM

## 2017-10-23 DIAGNOSIS — O0993 Supervision of high risk pregnancy, unspecified, third trimester: Secondary | ICD-10-CM | POA: Diagnosis not present

## 2017-10-23 DIAGNOSIS — O0933 Supervision of pregnancy with insufficient antenatal care, third trimester: Secondary | ICD-10-CM | POA: Diagnosis not present

## 2017-10-23 DIAGNOSIS — O409XX0 Polyhydramnios, unspecified trimester, not applicable or unspecified: Secondary | ICD-10-CM

## 2017-10-23 DIAGNOSIS — O10919 Unspecified pre-existing hypertension complicating pregnancy, unspecified trimester: Secondary | ICD-10-CM

## 2017-10-23 DIAGNOSIS — O403XX0 Polyhydramnios, third trimester, not applicable or unspecified: Secondary | ICD-10-CM

## 2017-10-23 DIAGNOSIS — O10913 Unspecified pre-existing hypertension complicating pregnancy, third trimester: Secondary | ICD-10-CM

## 2017-10-23 LAB — OB RESULTS CONSOLE GC/CHLAMYDIA: Gonorrhea: NEGATIVE
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
## 2017-10-23 NOTE — Progress Notes (Signed)
Patient reports fetal movement mostly at night, complains of occasional pressure.

## 2017-10-23 NOTE — Patient Instructions (Signed)
Return to clinic for any scheduled appointments or obstetric concerns, or go to [facility] for evaluation  

## 2017-10-23 NOTE — Telephone Encounter (Signed)
Preadmission screen  

## 2017-10-24 LAB — CBC
HEMATOCRIT: 32.1 % — AB (ref 34.0–46.6)
HEMOGLOBIN: 9.5 g/dL — AB (ref 11.1–15.9)
MCH: 19.9 pg — ABNORMAL LOW (ref 26.6–33.0)
MCHC: 29.6 g/dL — ABNORMAL LOW (ref 31.5–35.7)
MCV: 67 fL — ABNORMAL LOW (ref 79–97)
PLATELETS: 168 10*3/uL (ref 150–450)
RBC: 4.78 x10E6/uL (ref 3.77–5.28)
RDW: 19.1 % — AB (ref 12.3–15.4)
WBC: 5.2 10*3/uL (ref 3.4–10.8)

## 2017-10-24 LAB — RPR: RPR Ser Ql: NONREACTIVE

## 2017-10-24 LAB — GLUCOSE TOLERANCE, 2 HOURS W/ 1HR
GLUCOSE, 1 HOUR: 145 mg/dL (ref 65–179)
Glucose, 2 hour: 109 mg/dL (ref 65–152)
Glucose, Fasting: 81 mg/dL (ref 65–91)

## 2017-10-24 LAB — HIV ANTIBODY (ROUTINE TESTING W REFLEX): HIV SCREEN 4TH GENERATION: NONREACTIVE

## 2017-10-24 LAB — GC/CHLAMYDIA PROBE AMP (~~LOC~~) NOT AT ARMC
Chlamydia: NEGATIVE
Neisseria Gonorrhea: NEGATIVE

## 2017-10-25 LAB — STREP GP B NAA: Strep Gp B NAA: NEGATIVE
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
## 2017-10-29 ENCOUNTER — Inpatient Hospital Stay (HOSPITAL_COMMUNITY)
Admission: RE | Admit: 2017-10-29 | Discharge: 2017-10-31 | DRG: 805 | Disposition: A | Discharge status: Home / Self Care | Payer: Medicaid Other | Attending: Family Medicine | Admitting: Family Medicine

## 2017-10-29 ENCOUNTER — Encounter (HOSPITAL_COMMUNITY): Payer: Self-pay

## 2017-10-29 DIAGNOSIS — O26899 Other specified pregnancy related conditions, unspecified trimester: Secondary | ICD-10-CM

## 2017-10-29 DIAGNOSIS — K831 Obstruction of bile duct: Secondary | ICD-10-CM | POA: Diagnosis present

## 2017-10-29 DIAGNOSIS — O1002 Pre-existing essential hypertension complicating childbirth: Secondary | ICD-10-CM | POA: Diagnosis present

## 2017-10-29 DIAGNOSIS — Z3A37 37 weeks gestation of pregnancy: Secondary | ICD-10-CM

## 2017-10-29 DIAGNOSIS — O2662 Liver and biliary tract disorders in childbirth: Secondary | ICD-10-CM

## 2017-10-29 DIAGNOSIS — Z91040 Latex allergy status: Secondary | ICD-10-CM | POA: Diagnosis not present

## 2017-10-29 DIAGNOSIS — O1092 Unspecified pre-existing hypertension complicating childbirth: Secondary | ICD-10-CM

## 2017-10-29 DIAGNOSIS — O26619 Liver and biliary tract disorders in pregnancy, unspecified trimester: Secondary | ICD-10-CM

## 2017-10-29 DIAGNOSIS — R109 Unspecified abdominal pain: Secondary | ICD-10-CM

## 2017-10-29 LAB — CBC
HCT: 32.2 % — ABNORMAL LOW (ref 36.0–46.0)
HEMOGLOBIN: 10 g/dL — AB (ref 12.0–15.0)
MCH: 20 pg — ABNORMAL LOW (ref 26.0–34.0)
MCHC: 31.1 g/dL (ref 30.0–36.0)
MCV: 64.5 fL — AB (ref 78.0–100.0)
PLATELETS: 193 10*3/uL (ref 150–400)
RBC: 4.99 MIL/uL (ref 3.87–5.11)
RDW: 18.1 % — ABNORMAL HIGH (ref 11.5–15.5)
WBC: 7.1 10*3/uL (ref 4.0–10.5)

## 2017-10-29 LAB — TYPE AND SCREEN
ABO/RH(D): O POS
ANTIBODY SCREEN: NEGATIVE

## 2017-10-29 LAB — COMPREHENSIVE METABOLIC PANEL
ALK PHOS: 97 U/L (ref 38–126)
ALT: 7 U/L (ref 0–44)
AST: 20 U/L (ref 15–41)
Albumin: 3.4 g/dL — ABNORMAL LOW (ref 3.5–5.0)
Anion gap: 10 (ref 5–15)
BUN: 6 mg/dL (ref 6–20)
CHLORIDE: 106 mmol/L (ref 98–111)
CO2: 19 mmol/L — AB (ref 22–32)
Calcium: 8.9 mg/dL (ref 8.9–10.3)
Creatinine, Ser: 0.43 mg/dL — ABNORMAL LOW (ref 0.44–1.00)
GFR calc Af Amer: >60 mL/min (ref >60)
Glucose, Bld: 112 mg/dL — ABNORMAL HIGH (ref 70–99)
Potassium: 3.5 mmol/L (ref 3.5–5.1)
SODIUM: 135 mmol/L (ref 135–145)
Total Bilirubin: 0.6 mg/dL (ref 0.3–1.2)
Total Protein: 6.6 g/dL (ref 6.5–8.1)

## 2017-10-29 LAB — PROTEIN / CREATININE RATIO, URINE
CREATININE, URINE: 352 mg/dL
PROTEIN CREATININE RATIO: 0.19 mg/mg{creat} — AB (ref 0.00–0.15)
TOTAL PROTEIN, URINE: 67 mg/dL

## 2017-10-29 MED ORDER — FLEET ENEMA 7-19 GM/118ML RE ENEM: 1.0000 | ENEMA | Freq: Every day | RECTAL | Status: DC | PRN

## 2017-10-29 MED ORDER — ONDANSETRON HCL 4 MG PO TABS: 4.0000 mg | ORAL_TABLET | ORAL | Status: DC | PRN

## 2017-10-29 MED ORDER — LACTATED RINGERS IV SOLN
INTRAVENOUS | Status: DC
  Administered 2017-10-29: 02:00:00 via INTRAVENOUS

## 2017-10-29 MED ORDER — HYDROXYZINE HCL 50 MG PO TABS
50.0000 mg | ORAL_TABLET | Freq: Four times a day (QID) | ORAL | Status: DC | PRN
  Filled 2017-10-29: qty 1

## 2017-10-29 MED ORDER — ZOLPIDEM TARTRATE 5 MG PO TABS: 5.0000 mg | ORAL_TABLET | Freq: Every evening | ORAL | Status: DC | PRN

## 2017-10-29 MED ORDER — COCONUT OIL OIL: 1.0000 "application " | TOPICAL_OIL | Status: DC | PRN

## 2017-10-29 MED ORDER — SOD CITRATE-CITRIC ACID 500-334 MG/5ML PO SOLN
30.0000 mL | ORAL | Status: DC | PRN
  Administered 2017-10-29: 30 mL via ORAL
  Filled 2017-10-29: qty 15

## 2017-10-29 MED ORDER — MISOPROSTOL 25 MCG QUARTER TABLET
25.0000 ug | ORAL_TABLET | ORAL | Status: DC | PRN
  Administered 2017-10-29 (×2): 25 ug via VAGINAL
  Filled 2017-10-29 (×3): qty 1

## 2017-10-29 MED ORDER — TERBUTALINE SULFATE 1 MG/ML IJ SOLN: 0.2500 mg | Freq: Once | INTRAMUSCULAR | Status: DC | PRN

## 2017-10-29 MED ORDER — LACTATED RINGERS IV SOLN: 500.0000 mL | INTRAVENOUS | Status: DC | PRN

## 2017-10-29 MED ORDER — OXYCODONE-ACETAMINOPHEN 5-325 MG PO TABS: 2.0000 | ORAL_TABLET | ORAL | Status: DC | PRN

## 2017-10-29 MED ORDER — ACETAMINOPHEN 325 MG PO TABS
650.0000 mg | ORAL_TABLET | ORAL | Status: DC | PRN
  Administered 2017-10-30: 650 mg via ORAL
  Filled 2017-10-29: qty 2

## 2017-10-29 MED ORDER — DIBUCAINE 1 % RE OINT: 1.0000 "application " | TOPICAL_OINTMENT | RECTAL | Status: DC | PRN

## 2017-10-29 MED ORDER — OXYTOCIN 40 UNITS IN LACTATED RINGERS INFUSION - SIMPLE MED: 1.0000 m[IU]/min | INTRAVENOUS | Status: DC

## 2017-10-29 MED ORDER — SIMETHICONE 80 MG PO CHEW: 80.0000 mg | CHEWABLE_TABLET | ORAL | Status: DC | PRN

## 2017-10-29 MED ORDER — TERBUTALINE SULFATE 1 MG/ML IJ SOLN
0.2500 mg | Freq: Once | INTRAMUSCULAR | Status: DC | PRN
  Filled 2017-10-29: qty 1

## 2017-10-29 MED ORDER — ACETAMINOPHEN 325 MG PO TABS: 650.0000 mg | ORAL_TABLET | ORAL | Status: DC | PRN

## 2017-10-29 MED ORDER — OXYCODONE-ACETAMINOPHEN 5-325 MG PO TABS
1.0000 | ORAL_TABLET | ORAL | Status: DC | PRN
  Administered 2017-10-30 – 2017-10-31 (×5): 1 via ORAL
  Filled 2017-10-29 (×5): qty 1

## 2017-10-29 MED ORDER — BENZOCAINE-MENTHOL 20-0.5 % EX AERO
1.0000 "application " | INHALATION_SPRAY | CUTANEOUS | Status: DC | PRN
  Filled 2017-10-29: qty 56

## 2017-10-29 MED ORDER — OXYTOCIN 40 UNITS IN LACTATED RINGERS INFUSION - SIMPLE MED
1.0000 m[IU]/min | INTRAVENOUS | Status: DC
  Administered 2017-10-29: 2 m[IU]/min via INTRAVENOUS
  Filled 2017-10-29: qty 1000

## 2017-10-29 MED ORDER — DIPHENHYDRAMINE HCL 25 MG PO CAPS: 25.0000 mg | ORAL_CAPSULE | Freq: Four times a day (QID) | ORAL | Status: DC | PRN

## 2017-10-29 MED ORDER — WITCH HAZEL-GLYCERIN EX PADS: 1.0000 "application " | MEDICATED_PAD | CUTANEOUS | Status: DC | PRN

## 2017-10-29 MED ORDER — OXYTOCIN 10 UNIT/ML IJ SOLN
INTRAMUSCULAR | Status: AC
  Administered 2017-10-29: 10 [IU]
  Filled 2017-10-29: qty 1

## 2017-10-29 MED ORDER — OXYCODONE-ACETAMINOPHEN 5-325 MG PO TABS: 1.0000 | ORAL_TABLET | ORAL | Status: DC | PRN

## 2017-10-29 MED ORDER — ONDANSETRON HCL 4 MG/2ML IJ SOLN: 4.0000 mg | INTRAMUSCULAR | Status: DC | PRN

## 2017-10-29 MED ORDER — IBUPROFEN 600 MG PO TABS
600.0000 mg | ORAL_TABLET | Freq: Four times a day (QID) | ORAL | Status: DC
  Administered 2017-10-29 – 2017-10-31 (×8): 600 mg via ORAL
  Filled 2017-10-29 (×8): qty 1

## 2017-10-29 MED ORDER — FENTANYL CITRATE (PF) 100 MCG/2ML IJ SOLN
50.0000 ug | INTRAMUSCULAR | Status: DC | PRN
  Administered 2017-10-29: 100 ug via INTRAVENOUS
  Filled 2017-10-29: qty 2

## 2017-10-29 MED ORDER — LIDOCAINE HCL (PF) 1 % IJ SOLN
30.0000 mL | INTRAMUSCULAR | Status: DC | PRN
  Filled 2017-10-29: qty 30

## 2017-10-29 MED ORDER — OXYTOCIN 40 UNITS IN LACTATED RINGERS INFUSION - SIMPLE MED: 2.5000 [IU]/h | INTRAVENOUS | Status: DC

## 2017-10-29 MED ORDER — SENNOSIDES-DOCUSATE SODIUM 8.6-50 MG PO TABS
2.0000 | ORAL_TABLET | ORAL | Status: DC
  Administered 2017-10-30 – 2017-10-31 (×2): 2 via ORAL
  Filled 2017-10-29 (×2): qty 2

## 2017-10-29 MED ORDER — OXYTOCIN BOLUS FROM INFUSION: 500.0000 mL | Freq: Once | INTRAVENOUS | Status: DC

## 2017-10-29 MED ORDER — ONDANSETRON HCL 4 MG/2ML IJ SOLN: 4.0000 mg | Freq: Four times a day (QID) | INTRAMUSCULAR | Status: DC | PRN

## 2017-10-29 MED ORDER — PRENATAL MULTIVITAMIN CH
1.0000 | ORAL_TABLET | Freq: Every day | ORAL | Status: DC
  Administered 2017-10-30 – 2017-10-31 (×2): 1 via ORAL
  Filled 2017-10-29 (×2): qty 1

## 2017-10-29 MED ORDER — TETANUS-DIPHTH-ACELL PERTUSSIS 5-2.5-18.5 LF-MCG/0.5 IM SUSP: 0.5000 mL | Freq: Once | INTRAMUSCULAR | Status: DC

## 2017-10-29 NOTE — Progress Notes (Signed)
Labor Progress Note Danielle [name] is a 32 y.o. [id number] at 2518w3d presented for cholestasis.  S: Doing well. Resting peacefully. Feeling contractions approx q693min. No HA, vision changes, CP, SOB. Reports mild RUQ pain. No pruritis.   O:  BP (!) 144/75   Pulse 87   Temp 98.1 F (36.7 C)   Resp 16   Ht 5\' 7"  (1.702 m)   Wt 109.8 kg   BMI 37.92 kg/m  EFM: 140/mod var/+accels, no decels  CVE: Dilation: 3 Effacement (%): 70 Cervical Position: Posterior Station: -3, -2 Presentation: Vertex Exam by:: Kris HartmannNicole Jones, RN   A&P: 32 y.o. [id number] 3018w3d w/ IOL 2/2/ cholestasis #Labor: Progressing well. S/p cyto x2, cont Pit. Consider ORM, IUPC at next check. #Pain: defers intervention #FWB: Cat 1FHT #GBS negative CHTN:  pressures fairly well controlled. On borderline BP at 144/75. Cont to monitor.   Denzil HughesKelsey D Raidyn Breiner, MD 11:31 AM

## 2017-10-29 NOTE — Progress Notes (Signed)
Difficult to trace on monitor due to pt's activity, ambulating in room and on birthing ball.

## 2017-10-29 NOTE — Progress Notes (Signed)
Labor Progress Note Danielle [name] is a 32 y.o. [id number] at 2958w3d presented for cholestasis.  S: Doing well. Resting peacefully. Feeling contractions approx q373min, increased intensity. No HA, vision changes, CP, SOB. Reports mild RUQ pain. No pruritis.   O:  BP 138/66   Pulse 89   Temp 98.2 F (36.8 C) (Oral)   Resp 17   Ht 5\' 7"  (1.702 m)   Wt 109.8 kg   BMI 37.92 kg/m  EFM: 145/mod var/+accels, no decels. Ctx not picking up on external toco  CVE: Dilation: 4 Effacement (%): 70 Cervical Position: Posterior Station: -2 Presentation: Vertex Exam by:: Dr. Lorenza BurtonKelsey Jonel [name]   P: 32 y.o. [id number] 4158w3d w/ IOL 2/2/ cholestasis #Labor: Progressing well. S/p cyto x2, AROM@1530 . cont Pit. Consider IUPC at next check if does not progress further, ctx trace poorly. #Pain: defers intervention #FWB: Cat 1FHT #GBS negative #CHTN:  pressures slowly increase, no severe range. Max BP 153/85. Cont to monitor. PIH labs normal.  Denzil HughesKelsey D Felix Pratt, MD 3:45 PM

## 2017-10-29 NOTE — Anesthesia Pain Management Evaluation Note (Signed)
  CRNA Pain Management Visit Note  Patient: Danielle [name], 32 y.o., female  "Hello I am a member of the anesthesia team at [facility]. We have an anesthesia team available at all times to provide care throughout the hospital, including epidural management and anesthesia for C-section. I don't know your plan for the delivery whether it a natural birth, water birth, IV sedation, nitrous supplementation, doula or epidural, but we want to meet your pain goals."   1.Was your pain managed to your expectations on prior hospitalizations?   Yes   2.What is your expectation for pain management during this hospitalization?     planning natural. Possible IV pain medicine if desires.  3.How can we help you reach that goal? Pain control as desired  Record the patient's initial score and the patient's pain goal.   Pain: 3  Pain Goal: 8 [facility] wants you to be able to say your pain was always managed very well.  Mikala Podoll 10/29/2017

## 2017-10-30 LAB — RPR: RPR: NONREACTIVE

## 2017-10-30 NOTE — Lactation Note (Signed)
This note was copied from a baby's chart. Lactation Consultation Note  Patient Name: Danielle [name] ZOXWR'U Date: 10/30/2017 Reason for consult: Initial assessment;Early term 37-38.6wks P4, 11 hour female infant, ETI Per mom, BF previous last 2 children , eldest 18 months and 3rd child only 3 months due sore nipple trauma and pain from him having tongue tie. Mom hand expressed 5 ml of colostrum that was given to infant by spoon. Infant latched on left breast using cross-cradle position. BF 15 minutes, audible swallowing heard by LC and mom. LC discussed behaviors and infant feeding guidelines for ETI, mom will not exceed 3 hours without feeding infant and limit feeding 30 minutes or less. Mom encouraged to feed baby 8-12 times/24 hours and with feeding cues.  Mom know to use DEBP every 3 hours. LC discussed breast milk storage guidelines, assembly and re-assembly and cleaning of DEBP. LC discussed I&O. LC discussed : LC hotline, LC BF outpatient clinic, BF support group and BF resources within the local community.  Mom's plan: 1. BF according hunger cues, not exceed 3 hours without feeding and BF 15 mins, but not  more than 30 minutes for ETI. 2. Will hand express or use DEBP and give infant EBM back after each feeding. 3. Mom plans pump every 3 hours with DEBP as advised by LC.    Maternal Data Formula Feeding for Exclusion: No Has patient been taught Hand Expression?: Yes(Mom hand expressed 5 ml of colostrum that was spoon feed to infant.) Does the patient have breastfeeding experience prior to this delivery?: Yes  Feeding Feeding Type: Breast Fed Length of feed: 15 min  LATCH Score Latch: Grasps breast easily, tongue down, lips flanged, rhythmical sucking.  Audible Swallowing: Spontaneous and intermittent  Type of Nipple: Everted at rest and after stimulation  Comfort (Breast/Nipple): Soft / non-tender  Hold (Positioning): Assistance needed to correctly position infant at  breast and maintain latch.  LATCH Score: 9  Interventions Interventions: Breast feeding basics reviewed;Assisted with latch;Adjust position;Support pillows;Skin to skin;Breast massage;Hand express;Pre-pump if needed;Breast compression;DEBP  Lactation Tools Discussed/Used WIC Program: No Pump Review: Setup, frequency, and cleaning;Milk Storage Initiated by:: Danielle [name], IBCLC Date initiated:: 10/30/17   Consult Status Consult Status: Follow-up Date: 10/30/17 Follow-up type: In-patient    Danielle [name] 10/30/2017, 5:39 AM

## 2017-10-30 NOTE — Progress Notes (Signed)
Post Partum Day 1 Subjective: Danielle [name] is a 32 year old female who is post partum day 1 after SVD at 2822w3d. She is [id number]. She said "I am doing okay but I still am having some cramping". Patient reports that she has been up out of bed since delivery. She denies bowel movement but reports flatus. Denies headache, RUQ pain, and vision change. She reports eating without nausea. She states that her bleeding is "heavy", she reports it is more than a period but lighter than it was last night. She is currently breast feeding and would like Nexplanon for future contraception. She states that she is going to have baby circumcised outpatient.   Objective: Blood pressure 129/73, pulse 88, temperature 98 F (36.7 C), temperature source Oral, resp. rate 18, height 5\' 7"  (1.702 m), weight 109.8 kg, unknown if currently breastfeeding.  Physical Exam:  General: alert, cooperative and appears stated age  Heart: regular rate and rhythm, no murmurs noted.  Lungs: Clear to auscultation without areas of dullness or wheezing.  Lochia: appropriate Uterine Fundus: firm, no abdominal pain on palpation.  DVT Evaluation: Negative Homan's sign. No pain on palpation of bilateral lower extremities. No significant calf/ankle edema.  Recent Labs    10/29/17 0148  HGB 10.0*  HCT 32.2*    Assessment/Plan: Assessment: Danielle [name] is PPD#1 after SVD. She appears to be in stable condition. Patient is tolerating PO intake and ambulation well.  Plan: Monitor patient for increased bleeding or uncontrolled pain. Follow-up after patient has another meal to determine if she has had a bowel movement. Continue to monitor patient for any questions or concerns regarding breast feeding. Plan is to discharge to home tomorrow.    LOS: 1 day   Charyl Dancerrin Cincere Deprey 10/30/2017, 7:23 AM

## 2017-10-31 ENCOUNTER — Other Ambulatory Visit: Payer: Self-pay

## 2017-10-31 ENCOUNTER — Ambulatory Visit: Payer: Self-pay

## 2017-10-31 MED ORDER — IBUPROFEN 600 MG PO TABS: 600.0000 mg | ORAL_TABLET | Freq: Four times a day (QID) | ORAL | 0 refills | Status: DC

## 2017-10-31 MED ORDER — POLYETHYLENE GLYCOL 3350 17 G PO PACK
17.0000 g | PACK | Freq: Every day | ORAL | Status: DC
  Administered 2017-10-31: 17 g via ORAL
  Filled 2017-10-31 (×2): qty 1

## 2017-10-31 MED ORDER — ACETAMINOPHEN 325 MG PO TABS: 650.0000 mg | ORAL_TABLET | ORAL | 0 refills | Status: DC | PRN

## 2017-10-31 NOTE — Discharge Summary (Signed)
OB Discharge Summary     Patient Name: Danielle [name] DOB: [birth year] MRN: [id number]  Date of admission: 10/29/2017 Delivering MD: Lorenza Burton D   Date of discharge: 10/31/2017  Admitting diagnosis: 39 wk induction Intrauterine pregnancy: [redacted]w[redacted]d     Secondary diagnosis:  Active Problems:   Labor and delivery indication for care or intervention  Additional problems: none     Discharge diagnosis: Term Pregnancy Delivered                                                                                                Post partum procedures:none  Augmentation: AROM, Pitocin and Cytotec  Complications: None  Hospital course:  Induction of Labor With Vaginal Delivery   32 y.o. yo [id number] at [redacted]w[redacted]d was admitted to the hospital 10/29/2017 for induction of labor.  Indication for induction: Cholestasis of pregnancy.  Patient had an uncomplicated labor course as follows: Membrane Rupture Time/Date: 3:30 PM ,10/29/2017   Intrapartum Procedures: Episiotomy: None [1]                                         Lacerations:  None [1]  Patient had delivery of a Viable infant.  Information for the patient's newborn:  Teal, Raben [[id number]]  Delivery Method: Vaginal, Spontaneous(Filed from Delivery Summary)   10/29/2017  Details of delivery can be found in separate delivery note.  Patient had a routine postpartum course. Patient is discharged home 10/31/17.  Physical exam  Vitals:   10/30/17 0750 10/30/17 1715 10/30/17 2208 10/31/17 0539  BP: 130/67 134/81 128/72 139/75  Pulse: 78 89 87 85  Resp: 16 18 16 16   Temp: 98.2 F (36.8 C)  98.4 F (36.9 C) 97.8 F (36.6 C)  TempSrc: Oral  Oral Oral  SpO2:  100% 100% 100%  Weight:      Height:       General: alert, cooperative and no distress Lochia: appropriate Uterine Fundus: firm Incision: N/A DVT Evaluation: No evidence of DVT seen on physical exam. Labs: Lab Results  Component Value Date   WBC 7.1 10/29/2017   HGB 10.0 (L)  10/29/2017   HCT 32.2 (L) 10/29/2017   MCV 64.5 (L) 10/29/2017   PLT 193 10/29/2017   CMP Latest Ref Rng & Units 10/29/2017  Glucose 70 - 99 mg/dL 130(Q)  BUN 6 - 20 mg/dL 6  Creatinine 6.57 - 8.46 mg/dL 9.62(X)  Sodium 528 - 413 mmol/L 135  Potassium 3.5 - 5.1 mmol/L 3.5  Chloride 98 - 111 mmol/L 106  CO2 22 - 32 mmol/L 19(L)  Calcium 8.9 - 10.3 mg/dL 8.9  Total Protein 6.5 - 8.1 g/dL 6.6  Total Bilirubin 0.3 - 1.2 mg/dL 0.6  Alkaline Phos 38 - 126 U/L 97  AST 15 - 41 U/L 20  ALT 0 - 44 U/L 7    Discharge instruction: per After Visit Summary and "Baby and Me Booklet".  After visit meds:  Allergies as of 10/31/2017      Reactions  Latex Hives, Rash      Medication List    STOP taking these medications   aspirin 81 MG chewable tablet     TAKE these medications   acetaminophen 325 MG tablet Commonly known as:  TYLENOL Take 2 tablets (650 mg total) by mouth every 4 (four) hours as needed (for pain scale < 4).   CITRANATAL BLOOM 90-1 MG Tabs Take 1 tablet by mouth daily.   cyclobenzaprine 10 MG tablet Commonly known as:  FLEXERIL Take 1 tablet (10 mg total) by mouth 2 (two) times daily as needed for muscle spasms. As needed for headaches   ibuprofen 600 MG tablet Commonly known as:  ADVIL,MOTRIN Take 1 tablet (600 mg total) by mouth every 6 (six) hours.   Vitamin D (Ergocalciferol) 50000 units Caps capsule Commonly known as:  DRISDOL Take 1 capsule (50,000 Units total) by mouth every 7 (seven) days.       Diet: routine diet  Activity: Advance as tolerated. Pelvic rest for 6 weeks.   Outpatient follow up:1 week BP check Follow up Appt: Future Appointments  Date Time Provider Department Center  11/06/2017  3:15 PM [facility] NURSE [facility] None  11/27/2017  1:00 PM Adam Phenix, MD [facility] None   Follow up Visit:No follow-ups on file.  Postpartum contraception: Nexplanon  Newborn Data: Live born female  Birth Weight: 6 lb 12.3 oz (3070 g) APGAR: 9,  9  Newborn Delivery   Birth date/time:  10/29/2017 18:15:00 Delivery type:  Vaginal, Spontaneous     Baby Feeding: Breast Disposition:home with mother   10/31/2017 Mirian Mo, MD

## 2017-10-31 NOTE — Progress Notes (Signed)
Initiated [employer] My Journey app with patient due to a history of CHTN.  Able to successfully load app and take first BP reading.  Discussed use and to check BP readings twice a day.  Pt acknowledged directions and instructed to call [employer] customer service line if she has any problems with the app.

## 2017-10-31 NOTE — Lactation Note (Signed)
This note was copied from a baby's chart. Lactation Consultation Note  Patient Name: Danielle [name] ZOXWR'U Date: 10/31/2017  Infant at 45 hours of life & no stool, yet. Per Shanda Bumps, RN, formula is in Mom's room.   Lurline Hare [facility] 10/31/2017, 3:25 PM

## 2017-10-31 NOTE — Lactation Note (Addendum)
This note was copied from a baby's chart. Lactation Consultation Note  Patient Name: Boy Jazlynne Milliner ZOXWR'U Date: 10/31/2017 Reason for consult: Follow-up assessment  Mom is pumping with size 27 flanges. Mom reports that, historically, she does not "let down well" with a pump. In the past she has only been able to pump a maximum of 1.5oz/session, but fed one of her children well (and he gained weight without difficulty). Mom did report that it took 4-5 days for her milk to come to volume with her last 2 children & she needed to give formula supplementation.   Hand expression was taught to Mom. Mom is noted to have bilateral compression stripes on her nipples. Mom has my # to call for latch assessment.  Lurline Hare [facility] 10/31/2017, 11:58 AM

## 2017-10-31 NOTE — Discharge Instructions (Signed)
Vaginal Delivery, Care After °Refer to this sheet in the next few weeks. These instructions provide you with information about caring for yourself after vaginal delivery. Your health care provider may also give you more specific instructions. Your treatment has been planned according to current medical practices, but problems sometimes occur. Call your health care provider if you have any problems or questions. °What can I expect after the procedure? °After vaginal delivery, it is common to have: °· Some bleeding from your vagina. °· Soreness in your abdomen, your vagina, and the area of skin between your vaginal opening and your anus (perineum). °· Pelvic cramps. °· Fatigue. ° °Follow these instructions at home: °Medicines °· Take over-the-counter and prescription medicines only as told by your health care provider. °· If you were prescribed an antibiotic medicine, take it as told by your health care provider. Do not stop taking the antibiotic until it is finished. °Driving ° °· Do not drive or operate heavy machinery while taking prescription pain medicine. °· Do not drive for 24 hours if you received a sedative. °Lifestyle °· Do not drink alcohol. This is especially important if you are breastfeeding or taking medicine to relieve pain. °· Do not use tobacco products, including cigarettes, chewing tobacco, or e-cigarettes. If you need help quitting, ask your health care provider. °Eating and drinking °· Drink at least 8 eight-ounce glasses of water every day unless you are told not to by your health care provider. If you choose to breastfeed your baby, you may need to drink more water than this. °· Eat high-fiber foods every day. These foods may help prevent or relieve constipation. High-fiber foods include: °? Whole grain cereals and breads. °? Brown rice. °? Beans. °? Fresh fruits and vegetables. °Activity °· Return to your normal activities as told by your health care provider. Ask your health care provider  what activities are safe for you. °· Rest as much as possible. Try to rest or take a nap when your baby is sleeping. °· Do not lift anything that is heavier than your baby or 10 lb (4.5 kg) until your health care provider says that it is safe. °· Talk with your health care provider about when you can engage in sexual activity. This may depend on your: °? Risk of infection. °? Rate of healing. °? Comfort and desire to engage in sexual activity. °Vaginal Care °· If you have an episiotomy or a vaginal tear, check the area every day for signs of infection. Check for: °? More redness, swelling, or pain. °? More fluid or blood. °? Warmth. °? Pus or a bad smell. °· Do not use tampons or douches until your health care provider says this is safe. °· Watch for any blood clots that may pass from your vagina. These may look like clumps of dark red, brown, or black discharge. °General instructions °· Keep your perineum clean and dry as told by your health care provider. °· Wear loose, comfortable clothing. °· Wipe from front to back when you use the toilet. °· Ask your health care provider if you can shower or take a bath. If you had an episiotomy or a perineal tear during labor and delivery, your health care provider may tell you not to take baths for a certain length of time. °· Wear a bra that supports your breasts and fits you well. °· If possible, have someone help you with household activities and help care for your baby for at least a few days after   you leave the hospital. °· Keep all follow-up visits for you and your baby as told by your health care provider. This is important. °Contact a health care provider if: °· You have: °? Vaginal discharge that has a bad smell. °? Difficulty urinating. °? Pain when urinating. °? A sudden increase or decrease in the frequency of your bowel movements. °? More redness, swelling, or pain around your episiotomy or vaginal tear. °? More fluid or blood coming from your episiotomy or  vaginal tear. °? Pus or a bad smell coming from your episiotomy or vaginal tear. °? A fever. °? A rash. °? Little or no interest in activities you used to enjoy. °? Questions about caring for yourself or your baby. °· Your episiotomy or vaginal tear feels warm to the touch. °· Your episiotomy or vaginal tear is separating or does not appear to be healing. °· Your breasts are painful, hard, or turn red. °· You feel unusually sad or worried. °· You feel nauseous or you vomit. °· You pass large blood clots from your vagina. If you pass a blood clot from your vagina, save it to show to your health care provider. Do not flush blood clots down the toilet without having your health care provider look at them. °· You urinate more than usual. °· You are dizzy or light-headed. °· You have not breastfed at all and you have not had a menstrual period for 12 weeks after delivery. °· You have stopped breastfeeding and you have not had a menstrual period for 12 weeks after you stopped breastfeeding. °Get help right away if: °· You have: °? Pain that does not go away or does not get better with medicine. °? Chest pain. °? Difficulty breathing. °? Blurred vision or spots in your vision. °? Thoughts about hurting yourself or your baby. °· You develop pain in your abdomen or in one of your legs. °· You develop a severe headache. °· You faint. °· You bleed from your vagina so much that you fill two sanitary pads in one hour. °This information is not intended to replace advice given to you by your health care provider. Make sure you discuss any questions you have with your health care provider. °Document Released: 01/20/2000 Document Revised: 07/06/2015 Document Reviewed: 02/06/2015 °Elsevier Interactive Patient Education © 2018 Elsevier Inc. ° °
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
## 2017-11-01 ENCOUNTER — Ambulatory Visit: Payer: Self-pay

## 2017-11-01 NOTE — Lactation Note (Signed)
This note was copied from a baby's chart. Lactation Consultation Note  Patient Name: Danielle [name] Date: 11/01/2017 Reason for consult: Follow-up assessment   P3, Baby 64 hours old. Mother's milk is transitioning.  She is pumping 20 ml.  Breasts feel full. Infant's stools per mother are yellow and seedy. Mother is breastfeeding, supplementing w/ formula and breastmilk. Encouraged mother as her breastmilk transitions to give more breastmilk and less formula. Mom encouraged to feed baby 8-12 times/24 hours and with feeding cues.  Reviewed engorgement care and monitoring voids/stools. Discussed how to convert DEBP to manual.   Maternal Data    Feeding Feeding Type: Breast Fed Length of feed: 20 min  LATCH Score                   Interventions    Lactation Tools Discussed/Used     Consult Status Consult Status: Complete Date: 11/01/17    Dahlia Byes [facility] 11/01/2017, 10:18 AM

## 2017-11-05 ENCOUNTER — Other Ambulatory Visit: Payer: Self-pay | Admitting: Family Medicine

## 2017-11-05 DIAGNOSIS — O10919 Unspecified pre-existing hypertension complicating pregnancy, unspecified trimester: Secondary | ICD-10-CM

## 2017-11-05 MED ORDER — ENALAPRIL MALEATE 5 MG PO TABS: 5.0000 mg | ORAL_TABLET | Freq: Every day | ORAL | 0 refills | Status: DC

## 2017-11-10 ENCOUNTER — Other Ambulatory Visit: Payer: Self-pay | Admitting: Family Medicine

## 2017-11-10 DIAGNOSIS — O10919 Unspecified pre-existing hypertension complicating pregnancy, unspecified trimester: Secondary | ICD-10-CM

## 2017-11-10 MED ORDER — ENALAPRIL MALEATE 10 MG PO TABS: 5.0000 mg | ORAL_TABLET | Freq: Every day | ORAL | 0 refills | Status: DC

## 2017-11-10 NOTE — Progress Notes (Signed)
BabyScripts shows BP is still elevated. Will increase dose--MyChart message sent to her.

## 2017-11-16 ENCOUNTER — Inpatient Hospital Stay (HOSPITAL_COMMUNITY): Admit: 2017-11-16 | Payer: Medicaid Other

## 2017-11-27 ENCOUNTER — Ambulatory Visit: Payer: Medicaid Other | Admitting: Obstetrics & Gynecology

## 2017-12-01 ENCOUNTER — Encounter (HOSPITAL_COMMUNITY): Payer: Self-pay | Admitting: *Deleted

## 2017-12-01 ENCOUNTER — Other Ambulatory Visit: Payer: Self-pay

## 2017-12-01 ENCOUNTER — Emergency Department (HOSPITAL_COMMUNITY): Payer: Medicaid Other

## 2017-12-01 ENCOUNTER — Emergency Department (HOSPITAL_COMMUNITY)
Admission: EM | Admit: 2017-12-01 | Discharge: 2017-12-01 | Disposition: A | Discharge status: Home / Self Care | Payer: Medicaid Other | Attending: Emergency Medicine | Admitting: Emergency Medicine

## 2017-12-01 DIAGNOSIS — Y92410 Unspecified street and highway as the place of occurrence of the external cause: Secondary | ICD-10-CM | POA: Diagnosis not present

## 2017-12-01 DIAGNOSIS — S20219A Contusion of unspecified front wall of thorax, initial encounter: Secondary | ICD-10-CM | POA: Diagnosis not present

## 2017-12-01 DIAGNOSIS — Z91040 Latex allergy status: Secondary | ICD-10-CM | POA: Diagnosis not present

## 2017-12-01 DIAGNOSIS — S9031XA Contusion of right foot, initial encounter: Secondary | ICD-10-CM | POA: Diagnosis not present

## 2017-12-01 DIAGNOSIS — I10 Essential (primary) hypertension: Secondary | ICD-10-CM | POA: Diagnosis not present

## 2017-12-01 DIAGNOSIS — Y9301 Activity, walking, marching and hiking: Secondary | ICD-10-CM | POA: Diagnosis not present

## 2017-12-01 DIAGNOSIS — S299XXA Unspecified injury of thorax, initial encounter: Secondary | ICD-10-CM | POA: Diagnosis present

## 2017-12-01 DIAGNOSIS — S9032XA Contusion of left foot, initial encounter: Secondary | ICD-10-CM | POA: Diagnosis not present

## 2017-12-01 DIAGNOSIS — E039 Hypothyroidism, unspecified: Secondary | ICD-10-CM | POA: Diagnosis not present

## 2017-12-01 DIAGNOSIS — Z79899 Other long term (current) drug therapy: Secondary | ICD-10-CM | POA: Diagnosis not present

## 2017-12-01 DIAGNOSIS — S9030XA Contusion of unspecified foot, initial encounter: Secondary | ICD-10-CM

## 2017-12-01 DIAGNOSIS — R52 Pain, unspecified: Secondary | ICD-10-CM

## 2017-12-01 DIAGNOSIS — Y998 Other external cause status: Secondary | ICD-10-CM | POA: Insufficient documentation

## 2017-12-01 MED ORDER — ACETAMINOPHEN 325 MG PO TABS: 650.0000 mg | ORAL_TABLET | Freq: Once | ORAL | Status: DC

## 2017-12-01 MED ORDER — CYCLOBENZAPRINE HCL 10 MG PO TABS: 10.0000 mg | ORAL_TABLET | Freq: Two times a day (BID) | ORAL | 0 refills | Status: DC | PRN
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
## 2017-12-01 NOTE — Discharge Instructions (Signed)
Return to ED for worsening symptoms, trouble breathing or trouble swallowing, vomiting or coughing up blood, numbness in arms or legs.
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
## 2017-12-11 ENCOUNTER — Other Ambulatory Visit: Payer: Self-pay

## 2017-12-11 ENCOUNTER — Encounter (HOSPITAL_COMMUNITY): Payer: Self-pay

## 2017-12-11 ENCOUNTER — Emergency Department (HOSPITAL_COMMUNITY)
Admission: EM | Admit: 2017-12-11 | Discharge: 2017-12-12 | Disposition: A | Discharge status: Other Acute Inpatient Hospital | Payer: Medicaid Other | Attending: Emergency Medicine | Admitting: Emergency Medicine

## 2017-12-11 ENCOUNTER — Ambulatory Visit: Payer: Medicaid Other | Admitting: Advanced Practice Midwife

## 2017-12-11 DIAGNOSIS — E039 Hypothyroidism, unspecified: Secondary | ICD-10-CM | POA: Insufficient documentation

## 2017-12-11 DIAGNOSIS — F321 Major depressive disorder, single episode, moderate: Secondary | ICD-10-CM | POA: Insufficient documentation

## 2017-12-11 DIAGNOSIS — Z046 Encounter for general psychiatric examination, requested by authority: Secondary | ICD-10-CM

## 2017-12-11 DIAGNOSIS — F4310 Post-traumatic stress disorder, unspecified: Secondary | ICD-10-CM | POA: Insufficient documentation

## 2017-12-11 DIAGNOSIS — I10 Essential (primary) hypertension: Secondary | ICD-10-CM | POA: Insufficient documentation

## 2017-12-11 DIAGNOSIS — R4689 Other symptoms and signs involving appearance and behavior: Secondary | ICD-10-CM | POA: Insufficient documentation

## 2017-12-11 DIAGNOSIS — Z79899 Other long term (current) drug therapy: Secondary | ICD-10-CM | POA: Insufficient documentation

## 2017-12-11 LAB — ETHANOL: Alcohol, Ethyl (B): <10 mg/dL (ref <10)

## 2017-12-11 LAB — CBC WITH DIFFERENTIAL/PLATELET
Abs Immature Granulocytes: 0.01 10*3/uL (ref 0.00–0.07)
Basophils Absolute: 0 10*3/uL (ref 0.0–0.1)
Basophils Relative: 0 %
EOS ABS: 0.1 10*3/uL (ref 0.0–0.5)
EOS PCT: 2 %
HEMATOCRIT: 35.6 % — AB (ref 36.0–46.0)
HEMOGLOBIN: 10.1 g/dL — AB (ref 12.0–15.0)
Immature Granulocytes: 0 %
LYMPHS ABS: 2.3 10*3/uL (ref 0.7–4.0)
LYMPHS PCT: 38 %
MCH: 20.4 pg — AB (ref 26.0–34.0)
MCHC: 28.4 g/dL — AB (ref 30.0–36.0)
MCV: 71.9 fL — AB (ref 80.0–100.0)
MONO ABS: 0.6 10*3/uL (ref 0.1–1.0)
Monocytes Relative: 10 %
Neutro Abs: 3.1 10*3/uL (ref 1.7–7.7)
Neutrophils Relative %: 50 %
Platelets: 224 10*3/uL (ref 150–400)
RBC: 4.95 MIL/uL (ref 3.87–5.11)
RDW: 20.8 % — ABNORMAL HIGH (ref 11.5–15.5)
WBC: 6.2 10*3/uL (ref 4.0–10.5)
nRBC: 0 % (ref 0.0–0.2)

## 2017-12-11 LAB — URINALYSIS, ROUTINE W REFLEX MICROSCOPIC
BACTERIA UA: NONE SEEN
BILIRUBIN URINE: NEGATIVE
Glucose, UA: NEGATIVE mg/dL
Hgb urine dipstick: NEGATIVE
KETONES UR: 5 mg/dL — AB
Leukocytes, UA: NEGATIVE
Nitrite: NEGATIVE
PH: 5 (ref 5.0–8.0)
PROTEIN: 30 mg/dL — AB
Specific Gravity, Urine: 1.035 — ABNORMAL HIGH (ref 1.005–1.030)

## 2017-12-11 LAB — COMPREHENSIVE METABOLIC PANEL
ALBUMIN: 3.7 g/dL (ref 3.5–5.0)
ALT: 34 U/L (ref 0–44)
AST: 35 U/L (ref 15–41)
Alkaline Phosphatase: 73 U/L (ref 38–126)
Anion gap: 5 (ref 5–15)
BILIRUBIN TOTAL: 0.4 mg/dL (ref 0.3–1.2)
BUN: 10 mg/dL (ref 6–20)
CO2: 28 mmol/L (ref 22–32)
CREATININE: 0.48 mg/dL (ref 0.44–1.00)
Calcium: 8.9 mg/dL (ref 8.9–10.3)
Chloride: 109 mmol/L (ref 98–111)
GFR calc Af Amer: >60 mL/min (ref >60)
GLUCOSE: 102 mg/dL — AB (ref 70–99)
Potassium: 3.9 mmol/L (ref 3.5–5.1)
Sodium: 142 mmol/L (ref 135–145)
TOTAL PROTEIN: 6.7 g/dL (ref 6.5–8.1)

## 2017-12-11 LAB — RAPID URINE DRUG SCREEN, HOSP PERFORMED
AMPHETAMINES: NOT DETECTED
Barbiturates: NOT DETECTED
Benzodiazepines: POSITIVE — AB
Cocaine: NOT DETECTED
Opiates: NOT DETECTED
TETRAHYDROCANNABINOL: POSITIVE — AB

## 2017-12-11 LAB — I-STAT BETA HCG BLOOD, ED (MC, WL, AP ONLY): I-stat hCG, quantitative: <5 m[IU]/mL (ref <5)

## 2017-12-11 MED ORDER — LORAZEPAM 2 MG/ML IJ SOLN
1.0000 mg | Freq: Once | INTRAMUSCULAR | Status: AC
  Administered 2017-12-11: 1 mg via INTRAVENOUS
  Filled 2017-12-11: qty 1
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
## 2017-12-11 NOTE — ED Notes (Addendum)
After obtaining blood, patient became upset, stating she needed to be treated at a [facility]. Informed patient she was brought to the nearest emergency department since she was IVC. Patient became irriate and requesting to speak with someone beside this Clinical research associate. Tim, Charity fundraiser (Press photographer) came to speak with patient. Also, called security and off duty officer for assistance. Notified provider, Ativan ordered and administered. When given the Ativan, noticed patient had a purple slip/tank top on. She reports she is breast feeding. Verified she had a delivery at the end of September. Nelly Laurence RN and I came to assess patient contining to keep slip on. Nelly Laurence RN reported she could continue to have it the garment on. Patient ambulated to room 34 with Morrie Sheldon, Charity fundraiser and security.
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
## 2017-12-11 NOTE — ED Notes (Signed)
TTS at bedside. 

## 2017-12-11 NOTE — ED Notes (Signed)
Bed: [id number] Expected date:  Expected time:  Means of arrival:  Comments: 28 

## 2017-12-11 NOTE — ED Notes (Signed)
Pt denies any SI/HI and visual and auditory  Hallucinations.

## 2017-12-12 ENCOUNTER — Encounter (HOSPITAL_COMMUNITY): Payer: Self-pay

## 2017-12-12 ENCOUNTER — Inpatient Hospital Stay (HOSPITAL_COMMUNITY)
Admission: AD | Admit: 2017-12-12 | Discharge: 2017-12-16 | DRG: 885 | Disposition: A | Discharge status: Home / Self Care | Payer: Medicaid Other | Attending: Psychiatry | Admitting: Psychiatry

## 2017-12-12 ENCOUNTER — Other Ambulatory Visit: Payer: Self-pay

## 2017-12-12 DIAGNOSIS — F4310 Post-traumatic stress disorder, unspecified: Secondary | ICD-10-CM | POA: Diagnosis not present

## 2017-12-12 DIAGNOSIS — I10 Essential (primary) hypertension: Secondary | ICD-10-CM | POA: Diagnosis present

## 2017-12-12 DIAGNOSIS — F259 Schizoaffective disorder, unspecified: Secondary | ICD-10-CM | POA: Diagnosis present

## 2017-12-12 DIAGNOSIS — Z91410 Personal history of adult physical and sexual abuse: Secondary | ICD-10-CM | POA: Diagnosis not present

## 2017-12-12 DIAGNOSIS — Z79899 Other long term (current) drug therapy: Secondary | ICD-10-CM

## 2017-12-12 DIAGNOSIS — Z915 Personal history of self-harm: Secondary | ICD-10-CM | POA: Diagnosis not present

## 2017-12-12 DIAGNOSIS — E039 Hypothyroidism, unspecified: Secondary | ICD-10-CM | POA: Diagnosis present

## 2017-12-12 DIAGNOSIS — Z046 Encounter for general psychiatric examination, requested by authority: Secondary | ICD-10-CM

## 2017-12-12 DIAGNOSIS — Z818 Family history of other mental and behavioral disorders: Secondary | ICD-10-CM

## 2017-12-12 DIAGNOSIS — Z91411 Personal history of adult psychological abuse: Secondary | ICD-10-CM | POA: Diagnosis not present

## 2017-12-12 DIAGNOSIS — F321 Major depressive disorder, single episode, moderate: Secondary | ICD-10-CM | POA: Diagnosis present

## 2017-12-12 DIAGNOSIS — F1910 Other psychoactive substance abuse, uncomplicated: Secondary | ICD-10-CM | POA: Diagnosis not present

## 2017-12-12 DIAGNOSIS — F419 Anxiety disorder, unspecified: Secondary | ICD-10-CM | POA: Diagnosis not present

## 2017-12-12 MED ORDER — ONDANSETRON HCL 4 MG PO TABS: 4.0000 mg | ORAL_TABLET | Freq: Three times a day (TID) | ORAL | Status: DC | PRN

## 2017-12-12 MED ORDER — CYCLOBENZAPRINE HCL 10 MG PO TABS
10.0000 mg | ORAL_TABLET | Freq: Two times a day (BID) | ORAL | Status: DC | PRN
  Administered 2017-12-12: 10 mg via ORAL
  Filled 2017-12-12: qty 1

## 2017-12-12 MED ORDER — AMLODIPINE BESYLATE 5 MG PO TABS
10.0000 mg | ORAL_TABLET | Freq: Every day | ORAL | Status: DC
  Administered 2017-12-12: 10 mg via ORAL
  Filled 2017-12-12: qty 2

## 2017-12-12 MED ORDER — GABAPENTIN 100 MG PO CAPS: 100.0000 mg | ORAL_CAPSULE | Freq: Three times a day (TID) | ORAL | Status: DC

## 2017-12-12 MED ORDER — ALUM & MAG HYDROXIDE-SIMETH 200-200-20 MG/5ML PO SUSP: 30.0000 mL | ORAL | Status: DC | PRN

## 2017-12-12 MED ORDER — GABAPENTIN 300 MG PO CAPS
300.0000 mg | ORAL_CAPSULE | Freq: Three times a day (TID) | ORAL | Status: DC
  Administered 2017-12-12 (×2): 300 mg via ORAL
  Filled 2017-12-12 (×2): qty 1

## 2017-12-12 MED ORDER — AMLODIPINE BESYLATE 10 MG PO TABS
10.0000 mg | ORAL_TABLET | Freq: Every day | ORAL | Status: DC
  Administered 2017-12-13 – 2017-12-16 (×4): 10 mg via ORAL
  Filled 2017-12-12 (×6): qty 1

## 2017-12-12 MED ORDER — HYDROXYZINE HCL 25 MG PO TABS: 25.0000 mg | ORAL_TABLET | Freq: Three times a day (TID) | ORAL | Status: DC

## 2017-12-12 MED ORDER — HYDROXYZINE HCL 25 MG PO TABS
25.0000 mg | ORAL_TABLET | Freq: Three times a day (TID) | ORAL | Status: DC | PRN
  Administered 2017-12-12: 25 mg via ORAL
  Filled 2017-12-12 (×2): qty 1

## 2017-12-12 MED ORDER — ACETAMINOPHEN 325 MG PO TABS
650.0000 mg | ORAL_TABLET | Freq: Four times a day (QID) | ORAL | Status: DC | PRN
  Administered 2017-12-13 – 2017-12-14 (×4): 650 mg via ORAL
  Filled 2017-12-12 (×5): qty 2

## 2017-12-12 MED ORDER — IBUPROFEN 200 MG PO TABS
600.0000 mg | ORAL_TABLET | Freq: Three times a day (TID) | ORAL | Status: DC | PRN
  Administered 2017-12-12 (×2): 600 mg via ORAL
  Filled 2017-12-12 (×2): qty 3

## 2017-12-12 MED ORDER — MAGNESIUM HYDROXIDE 400 MG/5ML PO SUSP
30.0000 mL | Freq: Every day | ORAL | Status: DC | PRN
  Administered 2017-12-13: 30 mL via ORAL
  Filled 2017-12-12: qty 30

## 2017-12-12 MED ORDER — IBUPROFEN 600 MG PO TABS
600.0000 mg | ORAL_TABLET | Freq: Three times a day (TID) | ORAL | Status: DC | PRN
  Administered 2017-12-13 – 2017-12-16 (×4): 600 mg via ORAL
  Filled 2017-12-12 (×4): qty 1

## 2017-12-12 MED ORDER — TRAZODONE HCL 50 MG PO TABS
50.0000 mg | ORAL_TABLET | Freq: Every evening | ORAL | Status: DC | PRN
  Administered 2017-12-13: 50 mg via ORAL
  Filled 2017-12-12: qty 1

## 2017-12-12 MED ORDER — HYDROXYZINE HCL 25 MG PO TABS: 25.0000 mg | ORAL_TABLET | Freq: Three times a day (TID) | ORAL | Status: DC | PRN

## 2017-12-12 MED ORDER — CYCLOBENZAPRINE HCL 10 MG PO TABS
10.0000 mg | ORAL_TABLET | Freq: Two times a day (BID) | ORAL | Status: DC | PRN
  Administered 2017-12-13: 10 mg via ORAL
  Filled 2017-12-12: qty 1

## 2017-12-12 MED ORDER — ZOLPIDEM TARTRATE 5 MG PO TABS
5.0000 mg | ORAL_TABLET | Freq: Every evening | ORAL | Status: DC | PRN
  Administered 2017-12-12: 5 mg via ORAL
  Filled 2017-12-12: qty 1

## 2017-12-12 MED ORDER — HYDROXYZINE HCL 25 MG PO TABS
25.0000 mg | ORAL_TABLET | Freq: Three times a day (TID) | ORAL | Status: DC | PRN
  Administered 2017-12-13: 25 mg via ORAL
  Filled 2017-12-12: qty 1

## 2017-12-12 MED ORDER — GABAPENTIN 300 MG PO CAPS
300.0000 mg | ORAL_CAPSULE | Freq: Three times a day (TID) | ORAL | Status: DC
  Administered 2017-12-13: 300 mg via ORAL
  Filled 2017-12-12 (×4): qty 1
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
## 2017-12-12 NOTE — ED Notes (Signed)
Pt transferred to [facility] via gpd, pt stable.
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
## 2017-12-12 NOTE — BH Assessment (Signed)
[facility] Assessment Progress Note  Per Juanetta Beets, DO, this pt requires psychiatric hospitalization.  Berneice Heinrich, RN, [facility] has assigned pt to [facility] Rm 305-2; [facility] will be ready to receive pt at 21:00.  Pt presents under IVC initiated by pt's grandmother, however, IVC expired before First Examination was completed.  Dr Sharma Covert finds that pt continues to meet criteria for IVC, which she has initiated.  IVC documents have been faxed to [facility], and at 15:06 Hart Carwin confirms receipt.  She has since faxed Findings and Custody Order to this Clinical research associate.  At 15:14 I called [employer] and spoke to [employer], who took demographic information, agreeing to dispatch law enforcement to fill out Return of Service.  [place] police then presented at [facility], completing Return of Service, after which IVC documents were faxed to [facility].  Pt's nurse, Diane, has been notified, and agrees to call report to [phone].  Pt is to be transported via Patent examiner.   Doylene Canning, [place] Behavioral Health Coordinator [phone]

## 2017-12-12 NOTE — ED Notes (Signed)
Patient continues to deny SI/HI/AVh at this time. Patient has pumped breast twice on this shift. Patient has been calm and cooperative during this shift. Patient has interact with staff and peers appropriately. Q 15 min safety checks remain in place and video monitoring.

## 2017-12-12 NOTE — [facility] Counselor (Signed)
TTS counselor left a voice mail for patient's collateral contact, Crissie Figures [phone].

## 2017-12-13 DIAGNOSIS — F4310 Post-traumatic stress disorder, unspecified: Secondary | ICD-10-CM

## 2017-12-13 DIAGNOSIS — F419 Anxiety disorder, unspecified: Secondary | ICD-10-CM

## 2017-12-13 MED ORDER — HYDROXYZINE HCL 50 MG PO TABS
50.0000 mg | ORAL_TABLET | Freq: Four times a day (QID) | ORAL | Status: DC | PRN
  Administered 2017-12-13 – 2017-12-16 (×4): 50 mg via ORAL
  Filled 2017-12-13 (×4): qty 1

## 2017-12-13 MED ORDER — ARIPIPRAZOLE 10 MG PO TABS
10.0000 mg | ORAL_TABLET | Freq: Every day | ORAL | Status: DC
  Administered 2017-12-13 – 2017-12-16 (×4): 10 mg via ORAL
  Filled 2017-12-13 (×7): qty 1

## 2017-12-13 NOTE — Progress Notes (Signed)
Patient did not attend the evening speaker AA meeting. Pt was notified that group was beginning but remained in bed.   
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
## 2017-12-13 NOTE — Plan of Care (Signed)
  Problem: Education: Goal: Verbalization of understanding the information provided will improve Outcome: Progressing   Problem: Coping: Goal: Ability to demonstrate self-control will improve Outcome: Progressing  D: Pt alert and oriented on the unit. Pt engaging with RN staff and other pts. Pt denies SI/HI, A/VH. Pt is cooperative, but has been talking loudly during interactions with other pts on the unit. A: Education, support and encouragement provided, q15 minute safety checks remain in effect. Medications administered per MD orders. R: No reactions/side effects to medicine noted. Pt denies any concerns at this time, and verbally contracts for safety. Pt ambulating on the unit with no issues. Pt remains safe on and off the unit.

## 2017-12-13 NOTE — Progress Notes (Signed)
Recreation Therapy Notes  Date: 11.8.19 Time: 0930 Location: 300 Hall Dayroom  Group Topic: Stress Management  Goal Area(s) Addresses:  Patient will verbalize importance of using healthy stress management.  Patient will identify positive emotions associated with healthy stress management.   Behavioral Response: Engaged  Intervention: Stress Management  Activity :  Meditation.  LRT introduced the stress management technique of meditation.  LRT played a meditation that allowed patients to embody the stillness of a mountain.  Patients were to listen and follow along as the meditation played.  Education:  Stress Management, Discharge Planning.   Education Outcome: Acknowledges edcuation/In group clarification offered/Needs additional education  Clinical Observations/Feedback: Pt attended and participated in group.    Caroll Rancher, LRT/CTRS         Caroll Rancher A 12/13/2017 11:16 AM

## 2017-12-13 NOTE — Tx Team (Signed)
Interdisciplinary Treatment and Diagnostic Plan Update  12/13/2017 Time of Session: [place] MRN: [id number]  Principal Diagnosis: MDD  Secondary Diagnoses: Active Problems:   MDD (major depressive disorder), single episode, severe , no psychosis ([place])   Current Medications:  Current Facility-Administered Medications  Medication Dose Route Frequency Provider Last Rate Last Dose  . acetaminophen (TYLENOL) tablet 650 mg  650 mg Oral Q6H PRN Ethelene Hal, NP   650 mg at 12/13/17 0251  . alum & mag hydroxide-simeth (MAALOX/MYLANTA) 200-200-20 MG/5ML suspension 30 mL  30 mL Oral Q4H PRN Ethelene Hal, NP      . amLODipine (NORVASC) tablet 10 mg  10 mg Oral Daily Ethelene Hal, NP   10 mg at 12/13/17 0759  . cyclobenzaprine (FLEXERIL) tablet 10 mg  10 mg Oral BID PRN Ethelene Hal, NP   10 mg at 12/13/17 0234  . gabapentin (NEURONTIN) capsule 300 mg  300 mg Oral TID Ethelene Hal, NP   300 mg at 12/13/17 0759  . hydrOXYzine (ATARAX/VISTARIL) tablet 25 mg  25 mg Oral TID PRN Ethelene Hal, NP   25 mg at 12/13/17 0234  . ibuprofen (ADVIL,MOTRIN) tablet 600 mg  600 mg Oral Q8H PRN Ethelene Hal, NP      . magnesium hydroxide (MILK OF MAGNESIA) suspension 30 mL  30 mL Oral Daily PRN Ethelene Hal, NP   30 mL at 12/13/17 0252  . ondansetron (ZOFRAN) tablet 4 mg  4 mg Oral Q8H PRN Ethelene Hal, NP      . traZODone (DESYREL) tablet 50 mg  50 mg Oral QHS PRN Ethelene Hal, NP       PTA Medications: Medications Prior to Admission  Medication Sig Dispense Refill Last Dose  . acetaminophen (TYLENOL) 325 MG tablet Take 2 tablets (650 mg total) by mouth every 4 (four) hours as needed (for pain scale < 4). (Patient not taking: Reported on 12/01/2017) 60 tablet 0 Completed Course at Unknown time  . amLODipine (NORVASC) 10 MG tablet Take 10 mg by mouth daily.   Past Week at Unknown time  . cyclobenzaprine (FLEXERIL)  10 MG tablet Take 1 tablet (10 mg total) by mouth 2 (two) times daily as needed for muscle spasms. 20 tablet 0 unknown  . enalapril (VASOTEC) 10 MG tablet Take 0.5 tablets (5 mg total) by mouth daily. 30 tablet 0   . ibuprofen (ADVIL,MOTRIN) 600 MG tablet Take 1 tablet (600 mg total) by mouth every 6 (six) hours. (Patient taking differently: Take 600 mg by mouth every 6 (six) hours as needed for moderate pain. ) 60 tablet 0 unknown  . Prenatal-DSS-FeCb-FeGl-FA (CITRANATAL BLOOM) 90-1 MG TABS Take 1 tablet by mouth daily. 30 tablet 12 Past Week at Unknown time  . Vitamin D, Ergocalciferol, (DRISDOL) 50000 units CAPS capsule Take 1 capsule (50,000 Units total) by mouth every 7 (seven) days. 30 capsule 2 Past Month at Unknown time    Patient Stressors: Loss of fiance and newborn in [place] custody Traumatic event  Patient Strengths: Ability for insight Active sense of humor Capable of independent living Communication skills  Treatment Modalities: Medication Management, Group therapy, Case management,  1 to 1 session with clinician, Psychoeducation, Recreational therapy.   Physician Treatment Plan for Primary Diagnosis: MDD  Medication Management: Evaluate patient's response, side effects, and tolerance of medication regimen.  Therapeutic Interventions: 1 to 1 sessions, Unit Group sessions and Medication administration.  Evaluation of Outcomes: Not Met  Physician  Treatment Plan for Secondary Diagnosis: Active Problems:   MDD (major depressive disorder), single episode, severe , no psychosis ([place])  Medication Management: Evaluate patient's response, side effects, and tolerance of medication regimen.  Therapeutic Interventions: 1 to 1 sessions, Unit Group sessions and Medication administration.  Evaluation of Outcomes: Not Met   RN Treatment Plan for Primary Diagnosis: MDD Long Term Goal(s): Knowledge of disease and therapeutic regimen to maintain health will improve  Short Term Goals:  Ability to remain free from injury will improve, Ability to disclose and discuss suicidal ideas and Ability to identify and develop effective coping behaviors will improve  Medication Management: RN will administer medications as ordered by provider, will assess and evaluate patient's response and provide education to patient for prescribed medication. RN will report any adverse and/or side effects to prescribing provider.  Therapeutic Interventions: 1 on 1 counseling sessions, Psychoeducation, Medication administration, Evaluate responses to treatment, Monitor vital signs and CBGs as ordered, Perform/monitor CIWA, COWS, AIMS and Fall Risk screenings as ordered, Perform wound care treatments as ordered.  Evaluation of Outcomes: Not Met   LCSW Treatment Plan for Primary Diagnosis: MDD Long Term Goal(s): Safe transition to appropriate next level of care at discharge, Engage patient in therapeutic group addressing interpersonal concerns.  Short Term Goals: Engage patient in aftercare planning with referrals and resources, Facilitate patient progression through stages of change regarding substance use diagnoses and concerns and Identify triggers associated with mental health/substance abuse issues  Therapeutic Interventions: Assess for all discharge needs, 1 to 1 time with Social worker, Explore available resources and support systems, Assess for adequacy in community support network, Educate family and significant other(s) on suicide prevention, Complete Psychosocial Assessment, Interpersonal group therapy.  Evaluation of Outcomes: Not Met   Progress in Treatment: Attending groups: No. Pt new to unit. Continuing to assess.  Participating in groups: No. Taking medication as prescribed: Yes. Toleration medication: Yes. Family/Significant other contact made: No, will contact:  family member if pt consents to collateral contact. Chart flag indicates that pt has legal guardian. CSW assessing.   Patient understands diagnosis: Yes. Discussing patient identified problems/goals with staff: Yes. Medical problems stabilized or resolved: Yes. Denies suicidal/homicidal ideation: Yes. Issues/concerns per patient self-inventory: No. Other: n/a   New problem(s) identified: No, Describe:  n/a  New Short Term/Long Term Goal(s): , medication management for mood stabilization; elimination of SI thoughts; development of comprehensive mental wellness/sobriety plan.   Patient Goals:  "To get myself better so that I can get my kids back."   Discharge Plan or Barriers: CSW assessing for appropriate referrals. [place] pamphlet, [facility] Crisis information, and AA/NA information provided to patient for additional community support and resources.   Reason for Continuation of Hospitalization: Anxiety Depression Medication stabilization Suicidal ideation  Estimated Length of Stay: Tuesday, 12/17/17  Attendees: Patient: 12/13/2017 9:41 AM  Physician: Dr. Nancy Fetter MD 12/13/2017 9:41 AM  Nursing: Yetta Flock RN; Elberta Fortis RN 12/13/2017 9:41 AM  RN Care Manager:x 12/13/2017 9:41 AM  Social Worker: Janice Norrie LCSW 12/13/2017 9:41 AM  Recreational Therapist: x 12/13/2017 9:41 AM  Other: Lindell Spar NP 12/13/2017 9:41 AM  Other:  12/13/2017 9:41 AM  Other: 12/13/2017 9:41 AM    Scribe for Treatment Team: Avelina Laine, LCSW 12/13/2017 9:41 AM

## 2017-12-13 NOTE — Progress Notes (Signed)
Pt admitted IVC  to [facility] unit @ 2145. Pt was able to ambulate onto the unit with out issue. Pt presents with some irritability but pleasant mood. Pt sated that she is upset that anyone can IVC someone and they have to come to the hospital. Pt stated that she recently lost her fiance and was arrested at his funeral. As a result of that arrest, DSS has custody of her 49 wk old infant. She doesn't know why she is here. Pt very cooperative with admission.  Assessment completed without issue and consents signed. Pt's belongings searched and placed in locker by staff. Skin assessment completed by this RN and Taffney MHT Per protocol with no contraband found. Pt has a darkened area on Left middle toe and complains of losing nail on Right great toe. Multiple tattoos noted.  Pt oriented to room and unit routine, provided unit handbook, toiletries, and folder. Pt provided snack and drink. Pt complained of pain in R ribcage but denied need for medication at this time. Pt denies further need or concern at this time. Encouraged to express questions, needs, or concerns.  Pt educated on falls and encouraged to wear nonskid socks when ambulating in the halls. Pt also encouraged to call for help if feeling weak or dizzy. Pt verbalized understanding of all education provided. No signs or symptoms of pain or distress noted. Pt in currently in room, verbalizes understanding of education provided. Pt continues to remain safe on the unit, q 15 min observations initiated and in place. RN will continue to monitor. Patient ID: Danielle [name], female   DOB: 08-18-1985, 32 y.o.   MRN: [id number]
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
## 2017-12-13 NOTE — Progress Notes (Signed)
[facility] Group Notes:  (Nursing/MHT/Case Management/Adjunct)  Date:  12/13/2017  Time:  3:00 PM  Type of Therapy:  Nurse Education  Participation Level:  Active  Participation Quality:  Appropriate  Affect:  Appropriate  Cognitive:  Alert and Appropriate  Insight:  Appropriate and Improving  Engagement in Group:  Engaged  Modes of Intervention:  Activity and Discussion  Summary of Progress/Problems: Patient appropriately participated in group.  Shakeera Rightmyer 12/13/2017, 7:06 PM 

## 2017-12-13 NOTE — Progress Notes (Signed)
D.  Pt pleasant on approach, no complaints voiced.  Pt did not get up for evening AA group as she had been medicated for anxiety shortly before group and was sleeping.  Pt did get up later for snack and medications.  Pt denies SI/HI/AVH at this time.  A.   Support and encouragement offered, medication given as ordered  R.  Pt remains safe on the unit, will continue to monitor.

## 2017-12-14 DIAGNOSIS — F321 Major depressive disorder, single episode, moderate: Principal | ICD-10-CM

## 2017-12-14 DIAGNOSIS — F1910 Other psychoactive substance abuse, uncomplicated: Secondary | ICD-10-CM

## 2017-12-14 LAB — RPR: RPR Ser Ql: NONREACTIVE

## 2017-12-14 LAB — HIV ANTIBODY (ROUTINE TESTING W REFLEX): HIV Screen 4th Generation wRfx: NONREACTIVE

## 2017-12-14 MED ORDER — CYCLOBENZAPRINE HCL 10 MG PO TABS
10.0000 mg | ORAL_TABLET | Freq: Two times a day (BID) | ORAL | Status: DC | PRN
  Administered 2017-12-14 – 2017-12-16 (×6): 10 mg via ORAL
  Filled 2017-12-14 (×6): qty 1

## 2017-12-14 NOTE — Progress Notes (Signed)
D.  Pt pleasant on approach, no complaints voiced.  Pt was positive for evening wrap up group, ;has been appropriately engaged with peers on the unit today.  Went to bed directly after last group.  Pt denies SI/HI/AVH at this time.  A.  Support and encouragement offered  R.  Pt remains safe on the unit, will continue to monitor.
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
## 2017-12-14 NOTE — [facility] Group Notes (Signed)
[facility] Group Notes:  (Nursing)  Date:  12/14/2017  Time: 1 15 pm Type of Therapy:  Nurse Education  Participation Level:  Minimal  Participation Quality:  Inattentive  Affect:  Appropriate  Cognitive:  Alert  Insight:  Improving  Engagement in Group:  Improving  Modes of Intervention:  Discussion and Education  Summary of Progress/Problems: Nurse led group played a non competitive learning/communication board game that fosters listening skills as well as self expression  Shela Nevin 12/14/2017, 4:48 PM

## 2017-12-14 NOTE — Progress Notes (Signed)
Adult Psychoeducational Group Note  Date:  12/14/2017 Time:  6:38 PM  Group Topic/Focus:  Goals Group:   The focus of this group is to help patients establish daily goals to achieve during treatment and discuss how the patient can incorporate goal setting into their daily lives to aide in recovery.  Participation Level:  Active  Participation Quality:  Appropriate  Affect:  Appropriate  Cognitive:  Alert  Insight: Appropriate  Engagement in Group:  Engaged  Modes of Intervention:  Discussion  Additional Comments:  Pt attended group and participated in group discussion.  Tanyla Stege R Consepcion Utt 12/14/2017, 6:38 PM

## 2017-12-14 NOTE — Progress Notes (Signed)
Adult Psychoeducational Group Note  Date:  12/14/2017 Time:  9:26 PM  Group Topic/Focus:  Wrap-Up Group:   The focus of this group is to help patients review their daily goal of treatment and discuss progress on daily workbooks.  Participation Level:  Active  Participation Quality:  Appropriate  Affect:  Appropriate  Cognitive:  Appropriate  Insight: Appropriate  Engagement in Group:  Engaged  Modes of Intervention:  Discussion  Additional Comments: The patient expressed that she rates today a 8.The patient also said that her goal is attended groups.  Octavio Manns 12/14/2017, 9:26 PM
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
## 2017-12-14 NOTE — Progress Notes (Signed)
D. Pt presents with an anxious affect and mood- has been calm and cooperative- friendly during interactions. Pt visible on the unit interacting appropriately with peers. Per pt's self inventory, pt rates her depression, hopelessness and anxiety a 0/0/5, respectively. Pt writes that her most important goal today is " eat, participate in groups, smile,press forward despite situation" Pt currently denies SI/HI and AV hallucinations A. Labs and vitals monitored. Pt compliant with medications. Pt supported emotionally and encouraged to express concerns and ask questions.   R. Pt remains safe with 15 minute checks. Will continue POC.

## 2017-12-15 LAB — HEPATITIS C ANTIBODY: HCV Ab: <0.1 s/co ratio (ref 0.0–0.9)

## 2017-12-15 NOTE — [facility] Group Notes (Signed)
[facility] LCSW Group Therapy Note  12/15/2017  9:00-10:00AM  Type of Therapy and Topic:  Group Therapy:  Adding Supports Including Being Your Own Support  Participation Level:  Active   Description of Group:  Patients in this group were introduced to the concept that additional supports including self-support are an essential part of recovery.  A song entitled "I Need Help!" was played and a group discussion was held in reaction to the idea of needing to add supports.  A song entitled "My Own Hero" was played and a group discussion ensued in which patients stated they could relate to the song and it inspired them to realize they have be willing to help themselves in order to succeed, because other people cannot achieve sobriety or stability for them.  We discussed adding a variety of healthy supports to address the various needs in their lives.  A song was played called "I Know Where I've Been" toward the end of group and used to conduct an inspirational wrap-up to group of remembering how far they have already come in their journey.  Therapeutic Goals: 1)  demonstrate the importance of being a part of one's own support system 2)  discuss reasons people in one's life may eventually be unable to be continually supportive  3)  identify the patient's current support system and   4)  elicit commitments to add healthy supports and to become more conscious of being self-supportive   Summary of Patient Progress:  The patient expressed that her co-workers are healthy supports, and she shared how her therapist of 3 years died suddenly, so she felt that everybody around her dies and leaves her.  She continued to hold side conversations while others were sharing, and when CSW pointed this out and asked for each speaker to be respected, she left the room abruptly stating "If I stay I'm gonna cuss you out because you don't even know, these was happy tears."  When she returned to group she interacted appropriately and  was encouraged by CSW to provide positive ideas and feedback to other patients, which she did.  She remains convinced that she is in the hospital because her family told lies about her in order to have access to each of her 5 childrens' trust funds, and said this is the 17th time she has been either hospitalized or incarcerated in such a manner because of her family members.  CSW was able to provide positive strokes for appropriate interactions.  Therapeutic Modalities:   Motivational Interviewing Activity  Lynnell Chad

## 2017-12-15 NOTE — Progress Notes (Signed)
Patient did not attend the evening speaker AA meeting. Pt was notified that group was gong to be at 8 but pt returned to room and went to sleep.

## 2017-12-15 NOTE — Progress Notes (Signed)
D.  Pt pleasant on approach, minimal interaction.  Pt was positive for evening AA group, went to bed immediately after.  Pt denies complaints at this time.  Pt denies SI/HI/AVH on unit.  A. Support and encouragement offered, medication given as ordered  R.  Pt remains safe on the unit, will continue to monitor.

## 2017-12-15 NOTE — [facility] Group Notes (Signed)
[facility] Group Notes:  (Nursing)  Date:  12/15/2017  Time:130 PM Type of Therapy:  Nurse Education  Participation Level:  Active  Participation Quality:  Appropriate and Attentive  Affect:  Appropriate  Cognitive:  Alert and Appropriate  Insight:  Appropriate and Good  Engagement in Group:  Engaged  Modes of Intervention:  Discussion and Education  Summary of Progress/Problems: Nurse led psycho-educational group: Self hygiene  Shela Nevin 12/15/2017, 2:45 PM

## 2017-12-15 NOTE — Progress Notes (Signed)
D. Pt observed in the milieu interacting appropriately with peers and staff. Pt observed attending group led by SW. Per pt's self inventory, pt rates her depression, hopelessness and anxiety a 0/0/7, respectively.Pt writes that her most important goal today is "remain calm, cool and collected". Pt currently denies SI/HI and AV hallucinations. A. Labs and vitals monitored. Pt compliant with  medications. Pt supported emotionally and encouraged to express concerns and ask questions.   R. Pt remains safe with 15 minute checks. Will continue POC.
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
## 2017-12-16 LAB — GC/CHLAMYDIA PROBE AMP (~~LOC~~) NOT AT ARMC
Chlamydia: NEGATIVE
Neisseria Gonorrhea: NEGATIVE

## 2017-12-16 MED ORDER — ARIPIPRAZOLE ER 400 MG IM SRER
400.0000 mg | INTRAMUSCULAR | Status: DC
  Administered 2017-12-16: 400 mg via INTRAMUSCULAR

## 2017-12-16 MED ORDER — ARIPIPRAZOLE 10 MG PO TABS: 10.0000 mg | ORAL_TABLET | Freq: Every day | ORAL | 0 refills | Status: DC

## 2017-12-16 MED ORDER — AMLODIPINE BESYLATE 10 MG PO TABS: 10.0000 mg | ORAL_TABLET | Freq: Every day | ORAL | 0 refills | Status: DC

## 2017-12-16 MED ORDER — TRAZODONE HCL 50 MG PO TABS: 50.0000 mg | ORAL_TABLET | Freq: Every evening | ORAL | 0 refills | Status: DC | PRN

## 2017-12-16 MED ORDER — ARIPIPRAZOLE ER 400 MG IM SRER: 400.0000 mg | INTRAMUSCULAR | 0 refills | Status: DC

## 2017-12-16 MED ORDER — HYDROXYZINE HCL 50 MG PO TABS: 50.0000 mg | ORAL_TABLET | Freq: Four times a day (QID) | ORAL | 0 refills | Status: DC | PRN

## 2017-12-16 NOTE — [facility] Group Notes (Signed)
Pt attended spiritual care group on grief and loss facilitated by chaplain Hikeem Andersson   Group goal of establishing open and affirming space for members to engage grief, normalize and support grief experience and provide psycho social education and grief support. Group opened with brief discussion and psycho-social ed around grief and loss in relationships and in relation to self - identifying life patterns, circumstances, changes connected to grief response. Group participated in facilitated process group around topic of grief.   
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
## 2017-12-16 NOTE — Plan of Care (Signed)
Patient adheres to medication regimen

## 2017-12-16 NOTE — Progress Notes (Signed)
Patient ID: Danielle [name], female   DOB: 1985-05-14, 32 y.o.   MRN: [id number]  D. Pt presents with a masked affect and anxious behavior. Pt seen pacing at the nurses station this morning. Pt reports that "I want to go home today. I have 5 kids and I want to see them." Pt currently denies SI/HI and AVH and agrees to contact staff before acting on any harmful thoughts.   A. Labs and vitals monitored. Pt educated on hypertension and blood pressure medications. Pt given and educated on medications. Pt supported emotionally and encouraged to express concerns and ask questions. MD and SW consulted about patients request for discharge.   R. Pt remains safe with 15 minute checks. Pt adheres to medication regimen. Pt to be discharged per MD.  Will continue POC.

## 2017-12-16 NOTE — Progress Notes (Signed)
CSW attempted to reach CPS case worker to notify them of pt's discharge today. Office closed in observance of veterans day and unable to leave voicemail.  CSW to attempt again tomorrow.  Mahitha Hickling S. Alan Ripper, MSW, LCSW Clinical Social Worker 12/16/2017 3:13 PM
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
## 2017-12-16 NOTE — [facility] Group Notes (Signed)
Adult Psychoeducational Group Note  Date:  12/16/2017 Time:  9:21 AM  Group Topic/Focus:  Orientation:   The focus of this group is to educate the patient on the purpose and policies of crisis stabilization and provide a format to answer questions about their admission.  The group details unit policies and expectations of patients while admitted.  Participation Level:  Active  Participation Quality:  Appropriate  Affect:  Appropriate  Cognitive:  Alert  Insight: Appropriate  Engagement in Group:  Engaged  Modes of Intervention:  Orientation  Additional Comments:  Pt attended orientation group facilitated by Jacquelyne Balint 12/16/2017, 9:21 AM

## 2017-12-16 NOTE — [facility] Suicide Risk Assessment (Signed)
[facility] INPATIENT:  Family/Significant Other Suicide Prevention Education  Suicide Prevention Education:  Patient Refusal for Family/Significant Other Suicide Prevention Education: The patient Tahiri Shareef has refused to provide written consent for family/significant other to be provided Family/Significant Other Suicide Prevention Education during admission and/or prior to discharge.  Physician notified.  SPE completed with pt, as pt refused to consent to family contact. SPI pamphlet provided to pt and pt was encouraged to share information with support network, ask questions, and talk about any concerns relating to SPE. Pt denies access to guns/firearms and verbalized understanding of information provided. Mobile Crisis information also provided to pt.   Pt refused to consent to collateral contact with any family.   Rona Ravens LCSW 12/16/2017, 9:48 AM

## 2017-12-16 NOTE — Progress Notes (Signed)
Recreation Therapy Notes  Date: 11.11.19 Time: 0930 Location: [address] Dayroom  Group Topic: Stress Management  Goal Area(s) Addresses:  Patient will verbalize importance of using healthy stress management.  Patient will identify positive emotions associated with healthy stress management.   Behavioral Response: Engaged  Intervention: Stress Management  Activity :  Progressive Muscle Relaxation.  LRT introduced the stress management technique of progressive muscle relaxation.  LRT read a script to lead patients through a series of exercises that allowed them to tense and then relax each muscle group individually.    Education:  Stress Management, Discharge Planning.   Education Outcome: Acknowledges edcuation/In group clarification offered/Needs additional education  Clinical Observations/Feedback: Pt attended and participated in group.    Danielle [name], LRT/CTRS         Danielle [name] A 12/16/2017 10:56 AM 

## 2017-12-16 NOTE — Progress Notes (Signed)
Patient ID: Danielle [name], female   DOB: 12/12/1985, 32 y.o.   MRN: [id number]   Pt discharged to lobby. Pt was stable and appreciative at that time. All papers and prescriptions were given and valuables returned. Verbal understanding expressed. Denies SI/HI and A/VH. Pt given opportunity to express concerns and ask questions.

## 2017-12-16 NOTE — Progress Notes (Addendum)
  [facility] Adult Case Management Discharge Plan :  Will you be returning to the same living situation after discharge:  Yes,  home to her apt. At discharge, do you have transportation home?: Yes,  bus Do you have the ability to pay for your medications: Yes,  [facility]  Release of information consent forms completed and submitted to medical records by CSW.   Patient to Follow up at: Follow-up Information    [place] Follow up.   Specialty:  Professional Counselor Why:  Please resume your regularly scheduled group therapy. [facility] also has a walk in clinic (for medication management/therapy). Walk in hours: 8am-12pm. Thank you.  Contact information: [address] [place] [address] [phone]        [facility] Follow up.   Why:  Referral faxed: 12/14/17 at the recommendation of [facility]. Social worker left message for Delores to check status of referral. Please call Delores on Tuesday to check status of referral--[phone].  Contact information: [address] [place] [address] [phone]           Next level of care provider has access to [facility] Link:no  Safety Planning and Suicide Prevention discussed: Yes,  SPE completed with pt; pt declined to consent to collateral contact. SPI pamphlet and mobile Crisis informatoin provided to pt.   Have you used any form of tobacco in the last 30 days? (Cigarettes, Smokeless Tobacco, Cigars, and/or Pipes): No  Has patient been referred to the Quitline?: N/A patient is not a smoker  Patient has been referred for addiction treatment: Yes  Rona Ravens, LCSW 12/16/2017, 3:17 PM

## 2018-01-04 ENCOUNTER — Emergency Department (HOSPITAL_COMMUNITY): Payer: Medicaid Other

## 2018-01-04 ENCOUNTER — Emergency Department (HOSPITAL_COMMUNITY)
Admission: EM | Admit: 2018-01-04 | Discharge: 2018-01-04 | Disposition: A | Discharge status: Home / Self Care | Payer: Medicaid Other | Attending: Emergency Medicine | Admitting: Emergency Medicine

## 2018-01-04 ENCOUNTER — Encounter (HOSPITAL_COMMUNITY): Payer: Self-pay | Admitting: Emergency Medicine

## 2018-01-04 DIAGNOSIS — R109 Unspecified abdominal pain: Secondary | ICD-10-CM | POA: Diagnosis present

## 2018-01-04 DIAGNOSIS — I10 Essential (primary) hypertension: Secondary | ICD-10-CM | POA: Insufficient documentation

## 2018-01-04 DIAGNOSIS — K8020 Calculus of gallbladder without cholecystitis without obstruction: Secondary | ICD-10-CM | POA: Diagnosis not present

## 2018-01-04 DIAGNOSIS — B9689 Other specified bacterial agents as the cause of diseases classified elsewhere: Secondary | ICD-10-CM | POA: Diagnosis not present

## 2018-01-04 DIAGNOSIS — Z79899 Other long term (current) drug therapy: Secondary | ICD-10-CM | POA: Insufficient documentation

## 2018-01-04 DIAGNOSIS — N898 Other specified noninflammatory disorders of vagina: Secondary | ICD-10-CM | POA: Insufficient documentation

## 2018-01-04 DIAGNOSIS — R112 Nausea with vomiting, unspecified: Secondary | ICD-10-CM | POA: Insufficient documentation

## 2018-01-04 DIAGNOSIS — N760 Acute vaginitis: Secondary | ICD-10-CM | POA: Insufficient documentation

## 2018-01-04 LAB — LIPASE, BLOOD: Lipase: 27 U/L (ref 11–51)

## 2018-01-04 LAB — CBC
HCT: 35.9 % — ABNORMAL LOW (ref 36.0–46.0)
Hemoglobin: 10.4 g/dL — ABNORMAL LOW (ref 12.0–15.0)
MCH: 20.9 pg — ABNORMAL LOW (ref 26.0–34.0)
MCHC: 29 g/dL — AB (ref 30.0–36.0)
MCV: 72.1 fL — ABNORMAL LOW (ref 80.0–100.0)
Platelets: 247 10*3/uL (ref 150–400)
RBC: 4.98 MIL/uL (ref 3.87–5.11)
RDW: 19.8 % — ABNORMAL HIGH (ref 11.5–15.5)
WBC: 6.6 10*3/uL (ref 4.0–10.5)
nRBC: 0 % (ref 0.0–0.2)

## 2018-01-04 LAB — URINALYSIS, ROUTINE W REFLEX MICROSCOPIC
Bacteria, UA: NONE SEEN
Bilirubin Urine: NEGATIVE
Glucose, UA: NEGATIVE mg/dL
Hgb urine dipstick: NEGATIVE
Ketones, ur: NEGATIVE mg/dL
Nitrite: NEGATIVE
Protein, ur: NEGATIVE mg/dL
Specific Gravity, Urine: 1.025 (ref 1.005–1.030)
pH: 6 (ref 5.0–8.0)

## 2018-01-04 LAB — WET PREP, GENITAL
Sperm: NONE SEEN
Trich, Wet Prep: NONE SEEN
Yeast Wet Prep HPF POC: NONE SEEN

## 2018-01-04 LAB — COMPREHENSIVE METABOLIC PANEL
ALT: 19 U/L (ref 0–44)
AST: 21 U/L (ref 15–41)
Albumin: 3.5 g/dL (ref 3.5–5.0)
Alkaline Phosphatase: 73 U/L (ref 38–126)
Anion gap: 6 (ref 5–15)
BUN: 12 mg/dL (ref 6–20)
CALCIUM: 8.6 mg/dL — AB (ref 8.9–10.3)
CO2: 25 mmol/L (ref 22–32)
Chloride: 108 mmol/L (ref 98–111)
Creatinine, Ser: 0.5 mg/dL (ref 0.44–1.00)
GFR calc Af Amer: >60 mL/min (ref >60)
GFR calc non Af Amer: >60 mL/min (ref >60)
Glucose, Bld: 90 mg/dL (ref 70–99)
Potassium: 3.8 mmol/L (ref 3.5–5.1)
Sodium: 139 mmol/L (ref 135–145)
Total Bilirubin: 0.6 mg/dL (ref 0.3–1.2)
Total Protein: 6.8 g/dL (ref 6.5–8.1)

## 2018-01-04 LAB — I-STAT BETA HCG BLOOD, ED (MC, WL, AP ONLY): I-stat hCG, quantitative: <5 m[IU]/mL (ref <5)

## 2018-01-04 MED ORDER — MORPHINE SULFATE (PF) 4 MG/ML IV SOLN
4.0000 mg | Freq: Once | INTRAVENOUS | Status: AC
  Administered 2018-01-04: 4 mg via INTRAVENOUS
  Filled 2018-01-04: qty 1

## 2018-01-04 MED ORDER — KETOROLAC TROMETHAMINE 30 MG/ML IJ SOLN
30.0000 mg | Freq: Once | INTRAMUSCULAR | Status: AC
  Administered 2018-01-04: 30 mg via INTRAVENOUS
  Filled 2018-01-04: qty 1

## 2018-01-04 MED ORDER — METRONIDAZOLE 500 MG PO TABS: 500.0000 mg | ORAL_TABLET | Freq: Two times a day (BID) | ORAL | 0 refills | Status: DC

## 2018-01-04 MED ORDER — ONDANSETRON 8 MG PO TBDP: 8.0000 mg | ORAL_TABLET | Freq: Three times a day (TID) | ORAL | 0 refills | Status: DC | PRN

## 2018-01-04 MED ORDER — SODIUM CHLORIDE 0.9 % IV BOLUS: 1000.0000 mL | Freq: Once | INTRAVENOUS | Status: DC

## 2018-01-04 MED ORDER — ONDANSETRON HCL 4 MG/2ML IJ SOLN
4.0000 mg | Freq: Once | INTRAMUSCULAR | Status: AC
  Administered 2018-01-04: 4 mg via INTRAVENOUS
  Filled 2018-01-04: qty 2

## 2018-01-04 NOTE — ED Notes (Signed)
US at bedside

## 2018-01-04 NOTE — ED Triage Notes (Signed)
Patient here from home with complaints of abd pain. Reports "I know it is my gallbladder". Denies surgery because "I was pregnant at the time". Nausea no vomiting.
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
## 2018-01-06 LAB — GC/CHLAMYDIA PROBE AMP (~~LOC~~) NOT AT ARMC
Chlamydia: POSITIVE — AB
Neisseria Gonorrhea: NEGATIVE

## 2018-01-07 ENCOUNTER — Telehealth: Payer: Self-pay

## 2018-01-07 NOTE — Telephone Encounter (Signed)
Patient called in stating that [facility], pt states that no one has contacted her about treatment Routed to provider for review.

## 2018-01-08 ENCOUNTER — Other Ambulatory Visit: Payer: Self-pay | Admitting: Obstetrics

## 2018-01-08 ENCOUNTER — Telehealth: Payer: Self-pay

## 2018-01-08 DIAGNOSIS — A749 Chlamydial infection, unspecified: Secondary | ICD-10-CM

## 2018-01-08 MED ORDER — CEFIXIME 400 MG PO CAPS: 400.0000 mg | ORAL_CAPSULE | Freq: Once | ORAL | 0 refills | Status: AC

## 2018-01-08 MED ORDER — AZITHROMYCIN 500 MG PO TABS: 1000.0000 mg | ORAL_TABLET | Freq: Once | ORAL | 0 refills | Status: AC

## 2018-01-08 NOTE — Telephone Encounter (Signed)
Azithromycin / Suprax Rx for positive Chlamydia.

## 2018-01-08 NOTE — Telephone Encounter (Signed)
Called to make sure that pt is aware that rx was sent by provider, no answer, left vm

## 2018-01-08 NOTE — Telephone Encounter (Signed)
Pt called, advised that rx was sent by provider. Pt wanted rx for anxiety [facility], advised pt that she will have to follow up with PCP.

## 2018-01-08 NOTE — Telephone Encounter (Signed)
Returned call, no answer, left vm 

## 2018-01-16 ENCOUNTER — Encounter: Payer: Self-pay | Admitting: Certified Nurse Midwife

## 2018-01-16 ENCOUNTER — Ambulatory Visit (INDEPENDENT_AMBULATORY_CARE_PROVIDER_SITE_OTHER): Payer: Medicaid Other | Admitting: Certified Nurse Midwife

## 2018-01-16 VITALS — BP 139/81 | HR 91 | Wt 242.0 lb

## 2018-01-16 DIAGNOSIS — Z1389 Encounter for screening for other disorder: Secondary | ICD-10-CM

## 2018-01-16 DIAGNOSIS — Z3201 Encounter for pregnancy test, result positive: Secondary | ICD-10-CM | POA: Diagnosis not present

## 2018-01-16 DIAGNOSIS — O3680X0 Pregnancy with inconclusive fetal viability, not applicable or unspecified: Secondary | ICD-10-CM

## 2018-01-16 DIAGNOSIS — Z3009 Encounter for other general counseling and advice on contraception: Secondary | ICD-10-CM

## 2018-01-16 DIAGNOSIS — A749 Chlamydial infection, unspecified: Secondary | ICD-10-CM | POA: Diagnosis not present

## 2018-01-16 LAB — POCT URINE PREGNANCY: Preg Test, Ur: POSITIVE — AB

## 2018-01-16 NOTE — Progress Notes (Signed)
Post Partum Exam  Danielle [name] is a 32 y.o. [phone] female who presents for a postpartum visit. She is 11 weeks postpartum following a spontaneous vaginal delivery. I have fully reviewed the prenatal and intrapartum course. The delivery was at 3856w3d gestational weeks- IOL due to cholestasis.  Anesthesia: none. Postpartum course has been UNREMARKABLE per patient, Chart review notes IVC due to manic bipolar behavior in November- patient reports feeling better and denies currently having SI . Baby's course has been UNREMARKABLE. Baby is feeding by bottle - Enfamil Gentle Ease . Bleeding no bleeding. Bowel function is normal. Bladder function is normal. Patient is sexually active. Contraception method is none, request Nexplanon placement today. Postpartum depression screening:neg. EPDS = 3   The following portions of the patient's history were reviewed and updated as appropriate: allergies, current medications, past family history, past medical history and problem list. Last pap smear done 06/2017 and was Normal  Review of Systems Pertinent items noted in HPI and remainder of comprehensive ROS otherwise negative.    Objective:  currently breastfeeding.  General:  alert, cooperative and no distress   Breasts:  inspection negative, no nipple discharge or bleeding, no masses or nodularity palpable  Lungs: clear to auscultation bilaterally  Heart:  regular rate and rhythm and S1, S2 normal  Abdomen: soft, non-tender; bowel sounds normal; no masses,  no organomegaly   Vulva:  not evaluated  Vagina: not evaluated  Cervix:  not evaluated  Corpus: not examined  Adnexa:  not evaluated  Rectal Exam: Not performed.        Assessment/Plan:  1. Postpartum care and examination -  Abnormal postpartum exam- positive UPT. Pap smear not done at today's visit. - HCG and NOB labs obtained  - Patient reports not having period since end of October, unsure of dating   2. Birth control counseling - Educated and  discussed options for birth control after delivery, BTL vs LARC - POCT urine pregnancy  3. Positive urine pregnancy test - Beta hCG quant (ref lab) - Obstetric Panel, Including HIV - US OB LESS THAN 14 WEEKS WITH OB TRANSVAGINAL; Future  4. Chlamydia infection - Positive CH on 11/30, completed medication first week of December, [facility]    Follow up for dating US on 12/23, will schedule NOB appointment based on dating.  Sharyon CableVeronica C Dyllen Menning, CNM

## 2018-01-17 LAB — OBSTETRIC PANEL, INCLUDING HIV
Antibody Screen: NEGATIVE
Basophils Absolute: 0 10*3/uL (ref 0.0–0.2)
Basos: 0 %
EOS (ABSOLUTE): 0.2 10*3/uL (ref 0.0–0.4)
Eos: 3 %
HIV Screen 4th Generation wRfx: NONREACTIVE
Hematocrit: 36.5 % (ref 34.0–46.6)
Hemoglobin: 10.7 g/dL — ABNORMAL LOW (ref 11.1–15.9)
Hepatitis B Surface Ag: NEGATIVE
Immature Grans (Abs): 0 10*3/uL (ref 0.0–0.1)
Immature Granulocytes: 0 %
Lymphocytes Absolute: 2.3 10*3/uL (ref 0.7–3.1)
Lymphs: 35 %
MCH: 20.6 pg — ABNORMAL LOW (ref 26.6–33.0)
MCHC: 29.3 g/dL — ABNORMAL LOW (ref 31.5–35.7)
MCV: 70 fL — ABNORMAL LOW (ref 79–97)
Monocytes Absolute: 0.7 10*3/uL (ref 0.1–0.9)
Monocytes: 11 %
Neutrophils Absolute: 3.5 10*3/uL (ref 1.4–7.0)
Neutrophils: 51 %
Platelets: 313 10*3/uL (ref 150–450)
RBC: 5.2 x10E6/uL (ref 3.77–5.28)
RDW: 18.9 % — ABNORMAL HIGH (ref 12.3–15.4)
RPR Ser Ql: NONREACTIVE
Rh Factor: POSITIVE
Rubella Antibodies, IGG: 1.68 index (ref >0.99)
WBC: 6.8 10*3/uL (ref 3.4–10.8)

## 2018-01-17 LAB — BETA HCG QUANT (REF LAB): hCG Quant: 102 m[IU]/mL

## 2018-01-19 ENCOUNTER — Encounter: Payer: Self-pay | Admitting: Certified Nurse Midwife

## 2018-01-27 ENCOUNTER — Ambulatory Visit (HOSPITAL_COMMUNITY)
Admission: RE | Admit: 2018-01-27 | Discharge: 2018-01-27 | Disposition: A | Discharge status: Home / Self Care | Payer: Medicaid Other | Source: Ambulatory Visit | Attending: Certified Nurse Midwife | Admitting: Certified Nurse Midwife

## 2018-01-27 ENCOUNTER — Inpatient Hospital Stay (HOSPITAL_COMMUNITY)
Admission: AD | Admit: 2018-01-27 | Discharge: 2018-01-27 | Disposition: A | Discharge status: Home / Self Care | Payer: Medicaid Other | Attending: Obstetrics & Gynecology | Admitting: Obstetrics & Gynecology

## 2018-01-27 ENCOUNTER — Encounter (HOSPITAL_COMMUNITY): Payer: Self-pay | Admitting: *Deleted

## 2018-01-27 ENCOUNTER — Other Ambulatory Visit: Payer: Self-pay

## 2018-01-27 DIAGNOSIS — Z91040 Latex allergy status: Secondary | ICD-10-CM | POA: Diagnosis not present

## 2018-01-27 DIAGNOSIS — F4310 Post-traumatic stress disorder, unspecified: Secondary | ICD-10-CM | POA: Diagnosis not present

## 2018-01-27 DIAGNOSIS — B379 Candidiasis, unspecified: Secondary | ICD-10-CM | POA: Diagnosis not present

## 2018-01-27 DIAGNOSIS — O26899 Other specified pregnancy related conditions, unspecified trimester: Secondary | ICD-10-CM | POA: Insufficient documentation

## 2018-01-27 DIAGNOSIS — N898 Other specified noninflammatory disorders of vagina: Secondary | ICD-10-CM | POA: Diagnosis not present

## 2018-01-27 DIAGNOSIS — O99280 Endocrine, nutritional and metabolic diseases complicating pregnancy, unspecified trimester: Secondary | ICD-10-CM | POA: Diagnosis not present

## 2018-01-27 DIAGNOSIS — O98519 Other viral diseases complicating pregnancy, unspecified trimester: Secondary | ICD-10-CM | POA: Diagnosis not present

## 2018-01-27 DIAGNOSIS — O99340 Other mental disorders complicating pregnancy, unspecified trimester: Secondary | ICD-10-CM | POA: Diagnosis not present

## 2018-01-27 DIAGNOSIS — F329 Major depressive disorder, single episode, unspecified: Secondary | ICD-10-CM | POA: Insufficient documentation

## 2018-01-27 DIAGNOSIS — Z3A01 Less than 8 weeks gestation of pregnancy: Secondary | ICD-10-CM | POA: Diagnosis not present

## 2018-01-27 DIAGNOSIS — K828 Other specified diseases of gallbladder: Secondary | ICD-10-CM | POA: Insufficient documentation

## 2018-01-27 DIAGNOSIS — Z202 Contact with and (suspected) exposure to infections with a predominantly sexual mode of transmission: Secondary | ICD-10-CM | POA: Diagnosis present

## 2018-01-27 DIAGNOSIS — O10919 Unspecified pre-existing hypertension complicating pregnancy, unspecified trimester: Secondary | ICD-10-CM | POA: Diagnosis not present

## 2018-01-27 DIAGNOSIS — E039 Hypothyroidism, unspecified: Secondary | ICD-10-CM | POA: Diagnosis not present

## 2018-01-27 DIAGNOSIS — Z8249 Family history of ischemic heart disease and other diseases of the circulatory system: Secondary | ICD-10-CM | POA: Diagnosis not present

## 2018-01-27 DIAGNOSIS — Z3201 Encounter for pregnancy test, result positive: Secondary | ICD-10-CM | POA: Insufficient documentation

## 2018-01-27 DIAGNOSIS — O3680X0 Pregnancy with inconclusive fetal viability, not applicable or unspecified: Secondary | ICD-10-CM | POA: Diagnosis not present

## 2018-01-27 DIAGNOSIS — Z3A00 Weeks of gestation of pregnancy not specified: Secondary | ICD-10-CM | POA: Diagnosis not present

## 2018-01-27 DIAGNOSIS — R102 Pelvic and perineal pain: Secondary | ICD-10-CM | POA: Diagnosis present

## 2018-01-27 LAB — WET PREP, GENITAL
CLUE CELLS WET PREP: NONE SEEN
Sperm: NONE SEEN
Trich, Wet Prep: NONE SEEN

## 2018-01-27 MED ORDER — TERCONAZOLE 0.4 % VA CREA: 1.0000 | TOPICAL_CREAM | Freq: Every day | VAGINAL | 0 refills | Status: DC

## 2018-01-27 MED ORDER — IBUPROFEN 800 MG PO TABS
800.0000 mg | ORAL_TABLET | Freq: Once | ORAL | Status: DC
  Filled 2018-01-27: qty 1

## 2018-01-27 NOTE — MAU Provider Note (Signed)
History     CSN: [id number]  Arrival date and time: 01/27/18 1150   First Provider Initiated Contact with Patient 01/27/18 1228      Chief Complaint  Patient presents with  . Abdominal Pain  . Vaginal Pain   HPI Wakisha Alberts is a 32 y.o. [id number] with pregnancy of unknown GA who presents to MAU with chief complaint of Chlamydia exposure and treatment as well as pain associated with gallbladder attacks.   Chlamydia exposure Patient was diagnosed with and treated for Chlamydia 11/30 but is concerned because she is pregnant again and has not had a test of cure. She states her vaginal discharge has never completely gone away and she is concerned her treatment was not effective.  Gallbladder attacks This is a recurring problem. Patient has a surgical consult scheduled for 02/17/2018 but states she is not sure she can tolerate the pain until then. She endorses pain relief via IV analgesia during previous episodes and is requesting that today.  Patient signed in the MAU at 1150 and is concerned about the time required to implement care. She states she has a viability scan today at 1pm and "I figured they could just come get me from here since its the same hospital".   OB History    Gravida  8   Para  5   Term  5   Preterm  0   AB  2   Living  5     SAB  2   TAB  0   Ectopic  0   Multiple  0   Live Births  5        Obstetric Comments  Pt states she was induced at 80w4dfor pre-eclampsia for last pregnacy        Past Medical History:  Diagnosis Date  . Anxiety   . Depression   . Dizzy spells   . Endometriosis   . Foot fracture, right 2011  . Headache(784.0)   . Hypertension    on meds now  . Hypothyroidism   . Leg fracture, right 1998  . Pregnancy induced hypertension 2016  . PTSD (post-traumatic stress disorder)   . Trauma   . Vaginal Pap smear, abnormal 2017   did not f/u as instructed    Past Surgical History:  Procedure Laterality Date  .  DILATION AND CURETTAGE OF UTERUS  2008  . TOOTH EXTRACTION      Family History  Problem Relation Age of Onset  . Healthy Mother   . Diabetes Father   . Hypertension Father   . Diabetes Maternal Grandmother   . Cancer Maternal Grandmother   . Diabetes Maternal Grandfather   . Diabetes Paternal Grandmother   . Diabetes Paternal Grandfather   . Other Cousin        sickle cell disease  . Arthritis Other   . Asthma Other   . Early death Other        uncle-suicide  . Cataracts Other   . Congestive Heart Failure Other   . Hyperlipidemia Other   . Hypertension Other   . Stroke Other   . Migraines Other   . Anesthesia problems Neg Hx     Social History   Tobacco Use  . Smoking status: Never Smoker  . Smokeless tobacco: Never Used  Substance Use Topics  . Alcohol use: Not Currently    Alcohol/week: 0.0 standard drinks    Comment: occas.   . Drug use: Not Currently  Comment: no longer, last was first wk Nov    Allergies:  Allergies  Allergen Reactions  . Latex Hives and Rash    No medications prior to admission.    Review of Systems  Constitutional: Negative for chills, fatigue and fever.  Gastrointestinal: Negative for abdominal pain.       Endorses intermittent gallbladder pain, resolved upon arrival in MAU  Genitourinary: Positive for vaginal discharge. Negative for vaginal bleeding and vaginal pain.  Musculoskeletal: Negative for back pain.  All other systems reviewed and are negative.  Physical Exam   Blood pressure (!) 150/71, pulse 93, temperature 97.7 F (36.5 C), temperature source Oral, resp. rate 20, height _0  (1.727 m), weight 111.5 kg, SpO2 100 %, currently breastfeeding.  Physical Exam  Nursing note and vitals reviewed. Constitutional: She is oriented to person, place, and time. She appears well-developed and well-nourished.  Cardiovascular: Normal rate.  Respiratory: Effort normal.  GI: Soft. She exhibits no distension. There is no  abdominal tenderness. There is no rebound and no guarding.  Neurological: She is oriented to person, place, and time.  Skin: Skin is warm.  Psychiatric: She has a normal mood and affect. Her behavior is normal. Judgment and thought content normal.    MAU Course/MDM   --Patient marked "ready for provider" at about 12:25pm. Explained to patient that I cannot guarantee she will be assessed, pain medication administered and reevaluated before her appointment. Offered to work with Agricultural consultant and ultrasound to have patient met in MAU for viability scan. Patient declined, also declined further workup and medication. Patient collected vaginal swabs and requested immediate discharge from MAU  Patient Vitals for the past 24 hrs:  BP Temp Temp src Pulse Resp SpO2 Height Weight  01/27/18 1213 (!) 150/71 97.7 F (36.5 C) Oral 93 20 100 % - -  01/27/18 1200 - - - - - - _1  (1.727 m) 111.5 kg     Results for orders placed or performed during the hospital encounter of 01/27/18 (from the past 24 hour(s))  Wet prep, genital     Status: Abnormal   Collection Time: 01/27/18 12:40 PM  Result Value Ref Range   Yeast Wet Prep HPF POC PRESENT (A) NONE SEEN   Trich, Wet Prep NONE SEEN NONE SEEN   Clue Cells Wet Prep HPF POC NONE SEEN NONE SEEN   WBC, Wet Prep HPF POC FEW (A) NONE SEEN   Sperm NONE SEEN     Meds ordered this encounter  Medications  . DISCONTD: ibuprofen (ADVIL,MOTRIN) tablet 800 mg  . terconazole (TERAZOL 7) 0.4 % vaginal cream    Sig: Place 1 applicator vaginally at bedtime. Use for seven days    Dispense:  45 g    Refill:  0    Order Specific Question:   Supervising Provider    Answer:   Donnamae Jude [[id number]]     Assessment and Plan  --32 y.o.  [id number] at unknown GA --Yeast infection, patient called to notify of results and medication --Per patient request, discharge in stable condition without evaluation of reported gallbladder pain  F/U [place] [facility] OB  Darlina Rumpf, CNM 01/27/2018, 2:12 PM

## 2018-01-27 NOTE — Addendum Note (Signed)
Addended by: Sharyon CableOGERS, Catera Hankins C on: 01/27/2018 03:40 PM   Modules accepted: Orders

## 2018-01-27 NOTE — Progress Notes (Addendum)
Pt self swabbed for GC/Chlamydia & Wet prep screening.

## 2018-01-27 NOTE — MAU Note (Signed)
Presents with c/o 3 gallbladder attacks since the weekend and vaginal pain.  States has known gallstones with surgical consult scheduled for February 17, 2018.  Also states treated for Chlamydia earlier this month but continues to have vaginal pain, denies vaginal discharge or itching.  Hasn't had TOC yet.

## 2018-01-28 LAB — GC/CHLAMYDIA PROBE AMP (~~LOC~~) NOT AT ARMC
Chlamydia: NEGATIVE
Neisseria Gonorrhea: NEGATIVE

## 2018-02-05 NOTE — L&D Delivery Note (Addendum)
OB/GYN Faculty Practice Delivery Note  Danielle [name] is a 33 y.o. [id number] s/p uncomplicated at [redacted]w[redacted]d. She was admitted for IOL-cHTN.   ROM: 5h 24m with clear fluid (AROM @2035 ) GBS Status: positive, adequate ppx Maximum Maternal Temperature: 99.74F  Delivery Date/Time: 09/11/2018 @0225  Delivery: Called to room and patient was complete and pushing. Head delivered LOA. No nuchal cord present. Shoulder and body delivered in usual fashion. Infant with spontaneous cry, placed on mother's abdomen, dried and stimulated. Cord clamped x 2 after 1-minute delay, and cut by baby's aunt. Cord blood drawn. Placenta delivered spontaneously with gentle cord traction. Fundus firm with massage and Pitocin. Labia, perineum, vagina, and cervix inspected inspected with no lacerations noted.  Placenta: spontaneous, intact, 3v Complications: none Lacerations: none EBL: 100c, QBL pending Analgesia: CSE  Postpartum Planning [x]  message to sent to schedule follow-up  [x]  vaccines UTD  Infant: vigorous boy  APGARs 8/9  weight pending  Corliss Blacker, PGY-III Family Medicine  Attestation:  I confirm that I have verified the information documented in the resident's note and that I have also personally reperformed the physical exam and all medical decision making activities.   I was gloved and present for entire delivery SVD without incident No difficulty with shoulders No lacerations  Seabron Spates, CNM  Please schedule this patient for Postpartum visit in: 1 week with the following provider: Any provider For C/S patients schedule nurse incision check in weeks 2 weeks: no High risk pregnancy complicated by: HTN Delivery mode:  SVD Anticipated Birth Control:  Plans Interval BTL PP Procedures needed: BP check  Schedule Integrated [facility] visit: no

## 2018-02-06 ENCOUNTER — Ambulatory Visit (HOSPITAL_COMMUNITY): Payer: Medicaid Other

## 2018-03-24 ENCOUNTER — Encounter (HOSPITAL_COMMUNITY): Payer: Self-pay | Admitting: *Deleted

## 2018-03-24 ENCOUNTER — Other Ambulatory Visit: Payer: Self-pay

## 2018-03-24 ENCOUNTER — Inpatient Hospital Stay (HOSPITAL_COMMUNITY)
Admission: AD | Admit: 2018-03-24 | Discharge: 2018-03-24 | Disposition: A | Discharge status: Home / Self Care | Payer: Medicaid Other | Attending: Obstetrics and Gynecology | Admitting: Obstetrics and Gynecology

## 2018-03-24 DIAGNOSIS — Z7982 Long term (current) use of aspirin: Secondary | ICD-10-CM | POA: Diagnosis not present

## 2018-03-24 DIAGNOSIS — O10011 Pre-existing essential hypertension complicating pregnancy, first trimester: Secondary | ICD-10-CM | POA: Diagnosis not present

## 2018-03-24 DIAGNOSIS — O10911 Unspecified pre-existing hypertension complicating pregnancy, first trimester: Secondary | ICD-10-CM | POA: Diagnosis not present

## 2018-03-24 DIAGNOSIS — O98811 Other maternal infectious and parasitic diseases complicating pregnancy, first trimester: Secondary | ICD-10-CM | POA: Diagnosis not present

## 2018-03-24 DIAGNOSIS — Z3A13 13 weeks gestation of pregnancy: Secondary | ICD-10-CM

## 2018-03-24 DIAGNOSIS — O209 Hemorrhage in early pregnancy, unspecified: Secondary | ICD-10-CM | POA: Insufficient documentation

## 2018-03-24 DIAGNOSIS — B3731 Acute candidiasis of vulva and vagina: Secondary | ICD-10-CM

## 2018-03-24 DIAGNOSIS — O26891 Other specified pregnancy related conditions, first trimester: Secondary | ICD-10-CM | POA: Diagnosis not present

## 2018-03-24 DIAGNOSIS — B373 Candidiasis of vulva and vagina: Secondary | ICD-10-CM

## 2018-03-24 LAB — WET PREP, GENITAL
Clue Cells Wet Prep HPF POC: NONE SEEN
Sperm: NONE SEEN
Trich, Wet Prep: NONE SEEN

## 2018-03-24 MED ORDER — LABETALOL HCL 100 MG PO TABS: 100.0000 mg | ORAL_TABLET | Freq: Two times a day (BID) | ORAL | 0 refills | Status: DC

## 2018-03-24 MED ORDER — TERCONAZOLE 0.8 % VA CREA: 1.0000 | TOPICAL_CREAM | Freq: Every day | VAGINAL | 0 refills | Status: DC

## 2018-03-24 MED ORDER — ASPIRIN 81 MG PO CHEW: 81.0000 mg | CHEWABLE_TABLET | Freq: Every day | ORAL | 6 refills | Status: DC

## 2018-03-24 NOTE — Discharge Instructions (Signed)
Hypertension During Pregnancy ° °Hypertension, commonly called high blood pressure, is when the force of blood pumping through your arteries is too strong. Arteries are blood vessels that carry blood from the heart throughout the body. Hypertension during pregnancy can cause problems for you and your baby. Your baby may be born early (prematurely) or may not weigh as much as he or she should at birth. Very bad cases of hypertension during pregnancy can be life-threatening. °Different types of hypertension can occur during pregnancy. These include: °· Chronic hypertension. This happens when: °? You have hypertension before pregnancy and it continues during pregnancy. °? You develop hypertension before you are [redacted] weeks pregnant, and it continues during pregnancy. °· Gestational hypertension. This is hypertension that develops after the 20th week of pregnancy. °· Preeclampsia, also called toxemia of pregnancy. This is a very serious type of hypertension that develops during pregnancy. It can be very dangerous for you and your baby. °? In rare cases, you may develop preeclampsia after giving birth (postpartum preeclampsia). This usually occurs within 48 hours after childbirth but may occur up to 6 weeks after giving birth. °Gestational hypertension and preeclampsia usually go away within 6 weeks after your baby is born. Women who have hypertension during pregnancy have a greater chance of developing hypertension later in life or during future pregnancies. °What are the causes? °The exact cause of hypertension during pregnancy is not known. °What increases the risk? °There are certain factors that make it more likely for you to develop hypertension during pregnancy. These include: °· Having hypertension during a previous pregnancy or prior to pregnancy. °· Being overweight. °· Being age 35 or older. °· Being pregnant for the first time. °· Being pregnant with more than one baby. °· Becoming pregnant using fertilization  methods such as IVF (in vitro fertilization). °· Having diabetes, kidney problems, or systemic lupus erythematosus. °· Having a family history of hypertension. °What are the signs or symptoms? °Chronic hypertension and gestational hypertension rarely cause symptoms. Preeclampsia causes symptoms, which may include: °· Increased protein in your urine. Your health care provider will check for this at every visit before you give birth (prenatal visit). °· Severe headaches. °· Sudden weight gain. °· Swelling of the hands, face, legs, and feet. °· Nausea and vomiting. °· Vision problems, such as blurred or double vision. °· Numbness in the face, arms, legs, and feet. °· Dizziness. °· Slurred speech. °· Sensitivity to bright lights. °· Abdominal pain. °· Convulsions or seizures. °How is this diagnosed? °You may be diagnosed with hypertension during a routine prenatal exam. At each prenatal visit, you may: °· Have a urine test to check for high amounts of protein in your urine. °· Have your blood pressure checked. A blood pressure reading is given as two numbers, such as "120 over 80" (or 120/80). The first ("top") number is a measure of the pressure in your arteries when your heart beats (systolic pressure). The second ("bottom") number is a measure of the pressure in your arteries as your heart relaxes between beats (diastolic pressure). Blood pressure is measured in a unit called mm Hg. For most women, a normal blood pressure reading is: °? Systolic: below 120. °? Diastolic: below 80. °The type of hypertension that you are diagnosed with depends on your test results and when your symptoms developed. °· Chronic hypertension is usually diagnosed before 20 weeks of pregnancy. °· Gestational hypertension is usually diagnosed after 20 weeks of pregnancy. °· Hypertension with high amounts of protein in   the urine is diagnosed as preeclampsia. °· Blood pressure measurements that stay above 160 systolic, or above 110 diastolic,  are signs of severe preeclampsia. °How is this treated? °Treatment for hypertension during pregnancy varies depending on the type of hypertension you have and how serious it is. °· If you take medicines called ACE inhibitors to treat chronic hypertension, you may need to switch medicines. ACE inhibitors should not be taken during pregnancy. °· If you have gestational hypertension, you may need to take blood pressure medicine. °· If you are at risk for preeclampsia, your health care provider may recommend that you take a low-dose aspirin during your pregnancy. °· If you have severe preeclampsia, you may need to be hospitalized so you and your baby can be monitored closely. You may also need to take medicine (magnesium sulfate) to prevent seizures and to lower blood pressure. This medicine may be given as an injection or through an IV. °· In some cases, if your condition gets worse, you may need to deliver your baby early. °Follow these instructions at home: °Eating and drinking ° °· Drink enough fluid to keep your urine pale yellow. °· Avoid caffeine. °Lifestyle °· Do not use any products that contain nicotine or tobacco, such as cigarettes and e-cigarettes. If you need help quitting, ask your health care provider. °· Do not use alcohol or drugs. °· Avoid stress as much as possible. Rest and get plenty of sleep. °General instructions °· Take over-the-counter and prescription medicines only as told by your health care provider. °· While lying down, lie on your left side. This keeps pressure off your major blood vessels. °· While sitting or lying down, raise (elevate) your feet. Try putting some pillows under your lower legs. °· Exercise regularly. Ask your health care provider what kinds of exercise are best for you. °· Keep all prenatal and follow-up visits as told by your health care provider. This is important. °Contact a health care provider if: °· You have symptoms that your health care provider told you may  require more treatment or monitoring, such as: °? Nausea or vomiting. °? Headache. °Get help right away if you have: °· Severe abdominal pain that does not get better with treatment. °· A severe headache that does not get better. °· Vomiting that does not get better. °· Sudden, rapid weight gain. °· Sudden swelling in your hands, ankles, or face. °· Vaginal bleeding. °· Blood in your urine. °· Fewer movements from your baby than usual. °· Blurred or double vision. °· Muscle twitching or sudden muscle tightening (spasms). °· Shortness of breath. °· Blue fingernails or lips. °Summary °· Hypertension, commonly called high blood pressure, is when the force of blood pumping through your arteries is too strong. °· Hypertension during pregnancy can cause problems for you and your baby. °· Treatment for hypertension during pregnancy varies depending on the type of hypertension you have and how serious it is. °· Get help right away if you have symptoms that your health care provider told you to watch for. °This information is not intended to replace advice given to you by your health care provider. Make sure you discuss any questions you have with your health care provider. °Document Released: 10/10/2010 Document Revised: 01/08/2017 Document Reviewed: 07/08/2015 °Elsevier Interactive Patient Education © 2019 [employer] ° °

## 2018-03-24 NOTE — MAU Note (Signed)
Was spotting yesterday, didn't have childcare, so couldn't come.  Little bit this morning, none since.no pain.

## 2018-03-24 NOTE — MAU Provider Note (Signed)
Chief Complaint: Vaginal Bleeding   First Provider Initiated Contact with Patient 03/24/18 1543     SUBJECTIVE HPI: Danielle [name] is a 33 y.o. [id number] at [redacted]w[redacted]d who presents to [facility] reporting vaginal bleeding. Bright red spotting started yesterday. Is not saturating pads or passing clots. Denies abdominal pain, recent intercourse, or vaginal irritation.  Hx of CHTN. Used to be on meds but is between doctors & ran out of medication.   Past Medical History:  Diagnosis Date  . Anxiety   . Depression   . Dizzy spells   . Endometriosis   . Foot fracture, right 2011  . Headache(784.0)   . Hypertension    on meds now  . Hypothyroidism   . Leg fracture, right 1998  . Pregnancy induced hypertension 2016  . PTSD (post-traumatic stress disorder)   . Trauma   . Vaginal Pap smear, abnormal 2017   did not f/u as instructed   OB History  Gravida Para Term Preterm AB Living  8 5 5  0 2 5  SAB TAB Ectopic Multiple Live Births  2 0 0 0 5    # Outcome Date GA Lbr Len/2nd Weight Sex Delivery Anes PTL Lv  8 Current           7 Term 10/29/17 [redacted]w[redacted]d 02:11 / 00:04 3070 g M Vag-Spont None  LIV  6 Term 08/21/16 [redacted]w[redacted]d 09:13 / 00:03 3396 g M Vag-Spont None  LIV  5 Term 08/16/14 [redacted]w[redacted]d 04:10 / 00:13 3480 g M Vag-Spont None  LIV  4 Term 08/19/13 107w0d  3515 g M Vag-Spont None  LIV  3 SAB 2013          2 Term 10/05/03 [redacted]w[redacted]d  3515 g M Vag-Spont None  LIV  1 SAB             Obstetric Comments  Pt states she was induced at [redacted]w[redacted]d for pre-eclampsia for last pregnacy   Past Surgical History:  Procedure Laterality Date  . DILATION AND CURETTAGE OF UTERUS  2008  . TOOTH EXTRACTION     Social History   Socioeconomic History  . Marital status: Divorced    Spouse name: Not on file  . Number of children: Not on file  . Years of education: Not on file  . Highest education level: Not on file  Occupational History  . Not on file  Social Needs  . Financial resource strain: Not on file  .  Food insecurity:    Worry: Not on file    Inability: Not on file  . Transportation needs:    Medical: Not on file    Non-medical: Not on file  Tobacco Use  . Smoking status: Never Smoker  . Smokeless tobacco: Never Used  Substance and Sexual Activity  . Alcohol use: Not Currently    Alcohol/week: 0.0 standard drinks    Comment: occas.   . Drug use: Not Currently    Comment: no longer, last was first wk Nov  . Sexual activity: Yes    Birth control/protection: None  Lifestyle  . Physical activity:    Days per week: Not on file    Minutes per session: Not on file  . Stress: Not on file  Relationships  . Social connections:    Talks on phone: Not on file    Gets together: Not on file    Attends religious service: Not on file    Active member of club or organization: Not on file  Attends meetings of clubs or organizations: Not on file    Relationship status: Not on file  . Intimate partner violence:    Fear of current or ex partner: Not on file    Emotionally abused: Not on file    Physically abused: Not on file    Forced sexual activity: Not on file  Other Topics Concern  . Not on file  Social History Narrative  . Not on file   Family History  Problem Relation Age of Onset  . Healthy Mother   . Diabetes Father   . Hypertension Father   . Diabetes Maternal Grandmother   . Cancer Maternal Grandmother   . Diabetes Maternal Grandfather   . Diabetes Paternal Grandmother   . Diabetes Paternal Grandfather   . Other Cousin        sickle cell disease  . Arthritis Other   . Asthma Other   . Early death Other        uncle-suicide  . Cataracts Other   . Congestive Heart Failure Other   . Hyperlipidemia Other   . Hypertension Other   . Stroke Other   . Migraines Other   . Anesthesia problems Neg Hx    No current facility-administered medications on file prior to encounter.    Current Outpatient Medications on File Prior to Encounter  Medication Sig Dispense Refill   . ARIPiprazole (ABILIFY) 10 MG tablet Take 1 tablet (10 mg total) by mouth daily. 30 tablet 0   Allergies  Allergen Reactions  . Latex Hives and Rash    I have reviewed patient's Past Medical Hx, Surgical Hx, Family Hx, Social Hx, medications and allergies.   Review of Systems  Constitutional: Negative.   Gastrointestinal: Negative.   Genitourinary: Positive for vaginal bleeding. Negative for dysuria and vaginal discharge.    OBJECTIVE Patient Vitals for the past 24 hrs:  BP Temp Temp src Pulse Resp SpO2 Weight  03/24/18 1624 140/88 - - 93 16 - -  03/24/18 1528 (!) 145/74 - - - - - -  03/24/18 1458 (!) 155/69 99 F (37.2 C) Oral 98 18 100 % 108.3 kg   Constitutional: Well-developed, well-nourished female in no acute distress.  Cardiovascular: normal rate & rhythm, no murmur Respiratory: normal rate and effort. Lung sounds clear throughout GI: Abd soft, non-tender, Pos BS x 4. No guarding or rebound tenderness MS: Extremities nontender, no edema, normal ROM Neurologic: Alert and oriented x 4.  GU:     SPECULUM EXAM: NEFG, No blood. Small amount of clumpy white discharge adherent to vaginal walls. Cervix pink/smooth/not friable.   BIMANUAL: No CMT. cervix closed; uterus normal size, no adnexal tenderness or masses.    LAB RESULTS Results for orders placed or performed during the hospital encounter of 03/24/18 (from the past 24 hour(s))  Wet prep, genital     Status: Abnormal   Collection Time: 03/24/18  3:48 PM  Result Value Ref Range   Yeast Wet Prep HPF POC PRESENT (A) NONE SEEN   Trich, Wet Prep NONE SEEN NONE SEEN   Clue Cells Wet Prep HPF POC NONE SEEN NONE SEEN   WBC, Wet Prep HPF POC MANY (A) NONE SEEN   Sperm NONE SEEN     IMAGING No results found.  MAU COURSE Orders Placed This Encounter  Procedures  . Wet prep, genital  . Discharge patient   Meds ordered this encounter  Medications  . aspirin 81 MG chewable tablet    Sig: Chew 1  tablet (81 mg total)  by mouth daily.    Dispense:  30 tablet    Refill:  6    Order Specific Question:   Supervising Provider    Answer:   Conan BowensAVIS, KELLY M [[id number]]  . labetalol (NORMODYNE) 100 MG tablet    Sig: Take 1 tablet (100 mg total) by mouth 2 (two) times daily.    Dispense:  60 tablet    Refill:  0    Order Specific Question:   Supervising Provider    Answer:   Conan BowensDAVIS, KELLY M [[id number]]  . terconazole (TERAZOL 3) 0.8 % vaginal cream    Sig: Place 1 applicator vaginally at bedtime.    Dispense:  20 g    Refill:  0    Order Specific Question:   Supervising Provider    Answer:   Conan BowensDAVIS, KELLY M [[id number]]    MDM FHT present via doppler  Pt with elevated BPs & known CHTN. Will start on labetalol 100 mg BID & baby ASA. Pt plans on going to [facility] for prenatal care.   On exam, no blood seen & cervix closed. Wet prep positive for yeast. Pt prescribed terazol for yeast tx. GC/CT pending  ASSESSMENT 1. Vaginal yeast infection   2. Chronic hypertension complicating or reason for care during pregnancy, first trimester   3. [redacted] weeks gestation of pregnancy     PLAN Discharge home in stable condition. Start prenatal care Discussed reasons to return to [facility]U  Follow-up Information    [facility] Schedule an appointment as soon as possible for a visit.   Specialty:  Obstetrics and Gynecology Contact information: [address] [place] [id number] [phone]         Allergies as of 03/24/2018      Reactions   Latex Hives, Rash      Medication List    STOP taking these medications   amLODipine 10 MG tablet Commonly known as:  NORVASC   cyclobenzaprine 10 MG tablet Commonly known as:  FLEXERIL   enalapril 10 MG tablet Commonly known as:  VASOTEC   hydrOXYzine 50 MG tablet Commonly known as:  ATARAX/VISTARIL   metroNIDAZOLE 500 MG tablet Commonly known as:  FLAGYL   ondansetron 8 MG disintegrating tablet Commonly known as:  ZOFRAN ODT    propranolol 10 MG tablet Commonly known as:  INDERAL   terconazole 0.4 % vaginal cream Commonly known as:  TERAZOL 7 Replaced by:  terconazole 0.8 % vaginal cream   traZODone 50 MG tablet Commonly known as:  DESYREL     TAKE these medications   ARIPiprazole 10 MG tablet Commonly known as:  ABILIFY Take 1 tablet (10 mg total) by mouth daily. What changed:  Another medication with the same name was removed. Continue taking this medication, and follow the directions you see here.   aspirin 81 MG chewable tablet Chew 1 tablet (81 mg total) by mouth daily.   labetalol 100 MG tablet Commonly known as:  NORMODYNE Take 1 tablet (100 mg total) by mouth 2 (two) times daily.   terconazole 0.8 % vaginal cream Commonly known as:  TERAZOL 3 Place 1 applicator vaginally at bedtime. Replaces:  terconazole 0.4 % vaginal cream        Judeth HornLawrence, Lemar Bakos, NP 03/24/2018  7:35 PM

## 2018-03-24 NOTE — MAU Note (Signed)
?   Due date,  No period after del in Sept.  Was breast feeding.  Did not f/u in Dec.  Has not been seen since then

## 2018-03-25 LAB — GC/CHLAMYDIA PROBE AMP (~~LOC~~) NOT AT ARMC
Chlamydia: NEGATIVE
Neisseria Gonorrhea: NEGATIVE

## 2018-04-24 ENCOUNTER — Encounter (HOSPITAL_COMMUNITY): Payer: Self-pay | Admitting: *Deleted

## 2018-04-24 ENCOUNTER — Other Ambulatory Visit: Payer: Self-pay

## 2018-04-24 ENCOUNTER — Emergency Department (HOSPITAL_COMMUNITY): Payer: Medicaid Other

## 2018-04-24 ENCOUNTER — Inpatient Hospital Stay (HOSPITAL_COMMUNITY)
Admission: EM | Admit: 2018-04-24 | Discharge: 2018-04-27 | DRG: 818 | Disposition: A | Discharge status: Home / Self Care | Payer: Medicaid Other | Attending: General Surgery | Admitting: General Surgery

## 2018-04-24 DIAGNOSIS — Z8249 Family history of ischemic heart disease and other diseases of the circulatory system: Secondary | ICD-10-CM | POA: Diagnosis not present

## 2018-04-24 DIAGNOSIS — K08409 Partial loss of teeth, unspecified cause, unspecified class: Secondary | ICD-10-CM | POA: Diagnosis not present

## 2018-04-24 DIAGNOSIS — F418 Other specified anxiety disorders: Secondary | ICD-10-CM | POA: Diagnosis not present

## 2018-04-24 DIAGNOSIS — O99012 Anemia complicating pregnancy, second trimester: Secondary | ICD-10-CM | POA: Diagnosis not present

## 2018-04-24 DIAGNOSIS — O99612 Diseases of the digestive system complicating pregnancy, second trimester: Secondary | ICD-10-CM | POA: Diagnosis not present

## 2018-04-24 DIAGNOSIS — O99342 Other mental disorders complicating pregnancy, second trimester: Secondary | ICD-10-CM | POA: Diagnosis not present

## 2018-04-24 DIAGNOSIS — F4310 Post-traumatic stress disorder, unspecified: Secondary | ICD-10-CM | POA: Diagnosis not present

## 2018-04-24 DIAGNOSIS — K810 Acute cholecystitis: Secondary | ICD-10-CM | POA: Diagnosis not present

## 2018-04-24 DIAGNOSIS — R109 Unspecified abdominal pain: Secondary | ICD-10-CM

## 2018-04-24 DIAGNOSIS — K8012 Calculus of gallbladder with acute and chronic cholecystitis without obstruction: Secondary | ICD-10-CM | POA: Diagnosis present

## 2018-04-24 DIAGNOSIS — R1011 Right upper quadrant pain: Secondary | ICD-10-CM | POA: Diagnosis not present

## 2018-04-24 DIAGNOSIS — Z91040 Latex allergy status: Secondary | ICD-10-CM

## 2018-04-24 DIAGNOSIS — O10912 Unspecified pre-existing hypertension complicating pregnancy, second trimester: Secondary | ICD-10-CM | POA: Diagnosis not present

## 2018-04-24 DIAGNOSIS — Z79899 Other long term (current) drug therapy: Secondary | ICD-10-CM

## 2018-04-24 DIAGNOSIS — Z3A18 18 weeks gestation of pregnancy: Secondary | ICD-10-CM | POA: Diagnosis not present

## 2018-04-24 DIAGNOSIS — D649 Anemia, unspecified: Secondary | ICD-10-CM | POA: Diagnosis not present

## 2018-04-24 DIAGNOSIS — Z7982 Long term (current) use of aspirin: Secondary | ICD-10-CM

## 2018-04-24 DIAGNOSIS — K8020 Calculus of gallbladder without cholecystitis without obstruction: Secondary | ICD-10-CM

## 2018-04-24 LAB — URINALYSIS, ROUTINE W REFLEX MICROSCOPIC
Glucose, UA: NEGATIVE mg/dL
Hgb urine dipstick: NEGATIVE
Ketones, ur: NEGATIVE mg/dL
Leukocytes,Ua: NEGATIVE
Nitrite: NEGATIVE
Protein, ur: NEGATIVE mg/dL
SPECIFIC GRAVITY, URINE: 1.024 (ref 1.005–1.030)
pH: 7 (ref 5.0–8.0)

## 2018-04-24 LAB — COMPREHENSIVE METABOLIC PANEL
ALT: 65 U/L — ABNORMAL HIGH (ref 0–44)
AST: 90 U/L — ABNORMAL HIGH (ref 15–41)
Albumin: 3.4 g/dL — ABNORMAL LOW (ref 3.5–5.0)
Alkaline Phosphatase: 92 U/L (ref 38–126)
Anion gap: 7 (ref 5–15)
BUN: 7 mg/dL (ref 6–20)
CO2: 23 mmol/L (ref 22–32)
Calcium: 9 mg/dL (ref 8.9–10.3)
Chloride: 109 mmol/L (ref 98–111)
Creatinine, Ser: 0.51 mg/dL (ref 0.44–1.00)
GFR calc Af Amer: >60 mL/min (ref >60)
GFR calc non Af Amer: >60 mL/min (ref >60)
Glucose, Bld: 115 mg/dL — ABNORMAL HIGH (ref 70–99)
POTASSIUM: 3.6 mmol/L (ref 3.5–5.1)
Sodium: 139 mmol/L (ref 135–145)
Total Bilirubin: 1.6 mg/dL — ABNORMAL HIGH (ref 0.3–1.2)
Total Protein: 7.1 g/dL (ref 6.5–8.1)

## 2018-04-24 LAB — CBC
HCT: 35.3 % — ABNORMAL LOW (ref 36.0–46.0)
HEMOGLOBIN: 10.4 g/dL — AB (ref 12.0–15.0)
MCH: 21.4 pg — ABNORMAL LOW (ref 26.0–34.0)
MCHC: 29.5 g/dL — ABNORMAL LOW (ref 30.0–36.0)
MCV: 72.6 fL — ABNORMAL LOW (ref 80.0–100.0)
Platelets: 179 10*3/uL (ref 150–400)
RBC: 4.86 MIL/uL (ref 3.87–5.11)
RDW: 20.1 % — ABNORMAL HIGH (ref 11.5–15.5)
WBC: 5.9 10*3/uL (ref 4.0–10.5)
nRBC: 0 % (ref 0.0–0.2)

## 2018-04-24 LAB — LIPASE, BLOOD: Lipase: 30 U/L (ref 11–51)

## 2018-04-24 LAB — I-STAT BETA HCG BLOOD, ED (MC, WL, AP ONLY): I-stat hCG, quantitative: >2000 m[IU]/mL — ABNORMAL HIGH (ref <5)

## 2018-04-24 MED ORDER — LACTATED RINGERS IV SOLN
INTRAVENOUS | Status: DC
  Administered 2018-04-24 – 2018-04-26 (×3): via INTRAVENOUS

## 2018-04-24 MED ORDER — SODIUM CHLORIDE 0.9% FLUSH
3.0000 mL | Freq: Once | INTRAVENOUS | Status: AC
  Administered 2018-04-24: 3 mL via INTRAVENOUS

## 2018-04-24 MED ORDER — LABETALOL HCL 100 MG PO TABS
100.0000 mg | ORAL_TABLET | Freq: Two times a day (BID) | ORAL | Status: DC
  Administered 2018-04-24 – 2018-04-26 (×4): 100 mg via ORAL
  Filled 2018-04-24 (×4): qty 1

## 2018-04-24 MED ORDER — ASPIRIN 81 MG PO CHEW: 81.0000 mg | CHEWABLE_TABLET | Freq: Every day | ORAL | Status: DC

## 2018-04-24 MED ORDER — ARIPIPRAZOLE 10 MG PO TABS
10.0000 mg | ORAL_TABLET | Freq: Every day | ORAL | Status: DC
  Filled 2018-04-24 (×2): qty 1

## 2018-04-24 MED ORDER — HEPARIN SODIUM (PORCINE) 5000 UNIT/ML IJ SOLN
5000.0000 [IU] | Freq: Three times a day (TID) | INTRAMUSCULAR | Status: DC
  Administered 2018-04-24 – 2018-04-26 (×3): 5000 [IU] via SUBCUTANEOUS
  Filled 2018-04-24 (×4): qty 1

## 2018-04-24 MED ORDER — OXYCODONE HCL 5 MG PO TABS
5.0000 mg | ORAL_TABLET | Freq: Four times a day (QID) | ORAL | Status: DC | PRN
  Administered 2018-04-24 – 2018-04-25 (×2): 5 mg via ORAL
  Filled 2018-04-24 (×2): qty 1

## 2018-04-24 MED ORDER — HYDROMORPHONE HCL 1 MG/ML IJ SOLN
0.5000 mg | INTRAMUSCULAR | Status: DC | PRN
  Administered 2018-04-25 – 2018-04-26 (×3): 0.5 mg via INTRAVENOUS
  Filled 2018-04-24 (×3): qty 0.5

## 2018-04-24 MED ORDER — ONDANSETRON HCL 4 MG/2ML IJ SOLN
4.0000 mg | Freq: Four times a day (QID) | INTRAMUSCULAR | Status: DC | PRN
  Administered 2018-04-25 (×2): 4 mg via INTRAVENOUS
  Filled 2018-04-24 (×2): qty 2

## 2018-04-24 MED ORDER — LACTATED RINGERS IV BOLUS
1000.0000 mL | Freq: Once | INTRAVENOUS | Status: AC
  Administered 2018-04-24: 1000 mL via INTRAVENOUS

## 2018-04-24 MED ORDER — MORPHINE SULFATE (PF) 4 MG/ML IV SOLN
4.0000 mg | Freq: Once | INTRAVENOUS | Status: AC
  Administered 2018-04-24: 4 mg via INTRAVENOUS
  Filled 2018-04-24: qty 1

## 2018-04-24 MED ORDER — ACETAMINOPHEN 500 MG PO TABS
1000.0000 mg | ORAL_TABLET | Freq: Four times a day (QID) | ORAL | Status: DC
  Administered 2018-04-25 – 2018-04-26 (×2): 1000 mg via ORAL
  Filled 2018-04-24 (×2): qty 2

## 2018-04-24 MED ORDER — ONDANSETRON 4 MG PO TBDP: 4.0000 mg | ORAL_TABLET | Freq: Four times a day (QID) | ORAL | Status: DC | PRN

## 2018-04-24 MED ORDER — SODIUM CHLORIDE 0.9 % IV SOLN
Freq: Once | INTRAVENOUS | Status: AC
  Administered 2018-04-24: 21:00:00 via INTRAVENOUS

## 2018-04-24 MED ORDER — DIPHENHYDRAMINE HCL 50 MG/ML IJ SOLN: 12.5000 mg | Freq: Four times a day (QID) | INTRAMUSCULAR | Status: DC | PRN

## 2018-04-24 MED ORDER — DIPHENHYDRAMINE HCL 12.5 MG/5ML PO ELIX: 12.5000 mg | ORAL_SOLUTION | Freq: Four times a day (QID) | ORAL | Status: DC | PRN

## 2018-04-24 MED ORDER — SIMETHICONE 80 MG PO CHEW: 40.0000 mg | CHEWABLE_TABLET | Freq: Four times a day (QID) | ORAL | Status: DC | PRN

## 2018-04-24 MED ORDER — PIPERACILLIN-TAZOBACTAM 3.375 G IVPB
3.3750 g | Freq: Three times a day (TID) | INTRAVENOUS | Status: DC
  Administered 2018-04-24 – 2018-04-26 (×5): 3.375 g via INTRAVENOUS
  Filled 2018-04-24 (×7): qty 50

## 2018-04-24 NOTE — H&P (Signed)
CC: Consult by Dr. Madilyn Hook for possible cholecystitis  HPI: Danielle [name] is an 33 y.o. female currently [redacted]wks pregnant (G8P5) whom presented to the ED today complaining of MEG + RUQ pain that began 24hrs ago. The pain is nonradiating and persistent. Sharp. Denies f/c but has had nausea with the pain. She has had similar pain before - saw Dr. Derrell Lolling in our practice 03/2017 but was pregnant at that time and elected to wait until she had her child. She was supposed to follow-up 03/2018 she said but did no attend that appointment as she was pregnant again.  PSH: Reports diagnostic laparoscopy for endometriosis; she denies any other abdominal operations  Past Medical History:  Diagnosis Date  . Anxiety   . Depression   . Dizzy spells   . Endometriosis   . Foot fracture, right 2011  . Headache(784.0)   . Hypertension    on meds now  . Hypothyroidism   . Leg fracture, right 1998  . Pregnancy induced hypertension 2016  . PTSD (post-traumatic stress disorder)   . Trauma   . Vaginal Pap smear, abnormal 2017   did not f/u as instructed    Past Surgical History:  Procedure Laterality Date  . DILATION AND CURETTAGE OF UTERUS  2008  . TOOTH EXTRACTION      Family History  Problem Relation Age of Onset  . Healthy Mother   . Diabetes Father   . Hypertension Father   . Diabetes Maternal Grandmother   . Cancer Maternal Grandmother   . Diabetes Maternal Grandfather   . Diabetes Paternal Grandmother   . Diabetes Paternal Grandfather   . Other Cousin        sickle cell disease  . Arthritis Other   . Asthma Other   . Early death Other        uncle-suicide  . Cataracts Other   . Congestive Heart Failure Other   . Hyperlipidemia Other   . Hypertension Other   . Stroke Other   . Migraines Other   . Anesthesia problems Neg Hx     Social:  reports that she has never smoked. She has never used smokeless tobacco. She reports previous alcohol use. She reports previous drug use.   Allergies:  Allergies  Allergen Reactions  . Latex Hives and Rash    Medications: I have reviewed the patient's current medications.  Results for orders placed or performed during the hospital encounter of 04/24/18 (from the past 48 hour(s))  Lipase, blood     Status: None   Collection Time: 04/24/18  5:01 PM  Result Value Ref Range   Lipase 30 11 - 51 U/L    Comment: Performed at [facility], [address] [place], [place] [address]  Comprehensive metabolic panel     Status: Abnormal   Collection Time: 04/24/18  5:01 PM  Result Value Ref Range   Sodium 139 135 - 145 mmol/L   Potassium 3.6 3.5 - 5.1 mmol/L   Chloride 109 98 - 111 mmol/L   CO2 23 22 - 32 mmol/L   Glucose, Bld 115 (H) 70 - 99 mg/dL   BUN 7 6 - 20 mg/dL   Creatinine, Ser 8.29 0.44 - 1.00 mg/dL   Calcium 9.0 8.9 - 56.2 mg/dL   Total Protein 7.1 6.5 - 8.1 g/dL   Albumin 3.4 (L) 3.5 - 5.0 g/dL   AST 90 (H) 15 - 41 U/L   ALT 65 (H) 0 - 44 U/L  Alkaline Phosphatase 92 38 - 126 U/L   Total Bilirubin 1.6 (H) 0.3 - 1.2 mg/dL   GFR calc non Af Amer >60 >60 mL/min   GFR calc Af Amer >60 >60 mL/min   Anion gap 7 5 - 15    Comment: Performed at [facility], [address] [place], [place] [address]  CBC     Status: Abnormal   Collection Time: 04/24/18  5:01 PM  Result Value Ref Range   WBC 5.9 4.0 - 10.5 K/uL   RBC 4.86 3.87 - 5.11 MIL/uL   Hemoglobin 10.4 (L) 12.0 - 15.0 g/dL   HCT 84.6 (L) 96.2 - 95.2 %   MCV 72.6 (L) 80.0 - 100.0 fL   MCH 21.4 (L) 26.0 - 34.0 pg   MCHC 29.5 (L) 30.0 - 36.0 g/dL   RDW 84.1 (H) 32.4 - 40.1 %   Platelets 179 150 - 400 K/uL   nRBC 0.0 0.0 - 0.2 %    Comment: Performed at [facility], [address] [place], [place] [address]  I-Stat beta hCG blood, ED     Status: Abnormal   Collection Time: 04/24/18  5:07 PM  Result Value Ref Range   I-stat hCG, quantitative >2,000.0 (H) <5 mIU/mL   Comment 3            Comment:    GEST. AGE      CONC.  (mIU/mL)   <=1 WEEK        5 - 50     2 WEEKS       50 - 500     3 WEEKS       100 - 10,000     4 WEEKS     1,000 - 30,000        FEMALE AND NON-PREGNANT FEMALE:     LESS THAN 5 mIU/mL   Urinalysis, Routine w reflex microscopic     Status: Abnormal   Collection Time: 04/24/18  7:16 PM  Result Value Ref Range   Color, Urine AMBER (A) YELLOW    Comment: BIOCHEMICALS MAY BE AFFECTED BY COLOR   APPearance CLEAR CLEAR   Specific Gravity, Urine 1.024 1.005 - 1.030   pH 7.0 5.0 - 8.0   Glucose, UA NEGATIVE NEGATIVE mg/dL   Hgb urine dipstick NEGATIVE NEGATIVE   Bilirubin Urine SMALL (A) NEGATIVE   Ketones, ur NEGATIVE NEGATIVE mg/dL   Protein, ur NEGATIVE NEGATIVE mg/dL   Nitrite NEGATIVE NEGATIVE   Leukocytes,Ua NEGATIVE NEGATIVE    Comment: Performed at [facility], [address] [place], [place] [address]    US Abdomen Limited Ruq  Result Date: 04/24/2018 CLINICAL DATA:  Right upper quadrant pain EXAM: ULTRASOUND ABDOMEN LIMITED RIGHT UPPER QUADRANT COMPARISON:  Ultrasound abdomen 01/04/2018 FINDINGS: Gallbladder: Gallbladder is better distended on the current study compared to the prior study. Numerous gallstones measuring up to 9 mm. Gallbladder wall non thickened. Mild tenderness over the gallbladder during scanning. Common bile duct: Diameter: 7.0 mm, upper normal Liver: No focal lesion identified. Within normal limits in parenchymal echogenicity. Portal vein is patent on color Doppler imaging with normal direction of blood flow towards the liver. IMPRESSION: Cholelithiasis. No gallbladder wall thickening but mild tenderness over the gallbladder. Common bile duct 7 mm Electronically Signed   By: Marlan Palau M.D.   On: 04/24/2018 18:30    ROS - all of the below systems have been reviewed with the patient and positives are indicated  with bold text General: chills, fever or night sweats Eyes: blurry vision or double vision ENT: epistaxis or  sore throat Allergy/Immunology: itchy/watery eyes or nasal congestion Hematologic/Lymphatic: bleeding problems, blood clots or swollen lymph nodes Endocrine: temperature intolerance or unexpected weight changes Breast: new or changing breast lumps or nipple discharge Resp: cough, shortness of breath, or wheezing CV: chest pain or dyspnea on exertion GI: as per HPI GU: dysuria, trouble voiding, or hematuria MSK: joint pain or joint stiffness Neuro: TIA or stroke symptoms Derm: pruritus and skin lesion changes Psych: anxiety and depression  PE Blood pressure 138/77, pulse 95, temperature 98.6 F (37 C), temperature source Oral, resp. rate 16, SpO2 100 %, not currently breastfeeding. Constitutional: NAD; conversant; no deformities Eyes: Moist conjunctiva; no lid lag; anicteric; PERRL Neck: Trachea midline; no thyromegaly Lungs: Normal respiratory effort; no tactile fremitus CV: RRR; no palpable thrills; no pitting edema GI: Abd soft, gravid inferior to umbilicus; moderately ttp in RUQ; no palpable hepatosplenomegaly MSK: Normal gait; no clubbing/cyanosis Psychiatric: Appropriate affect; alert and oriented x3 Lymphatic: No palpable cervical or axillary lymphadenopathy  Results for orders placed or performed during the hospital encounter of 04/24/18 (from the past 48 hour(s))  Lipase, blood     Status: None   Collection Time: 04/24/18  5:01 PM  Result Value Ref Range   Lipase 30 11 - 51 U/L    Comment: Performed at [facility], [address], [place], [place] [address]  Comprehensive metabolic panel     Status: Abnormal   Collection Time: 04/24/18  5:01 PM  Result Value Ref Range   Sodium 139 135 - 145 mmol/L   Potassium 3.6 3.5 - 5.1 mmol/L   Chloride 109 98 - 111 mmol/L   CO2 23 22 - 32 mmol/L   Glucose, Bld 115 (H) 70 - 99 mg/dL   BUN 7 6 - 20 mg/dL   Creatinine, Ser 6.96 0.44 - 1.00 mg/dL   Calcium 9.0 8.9 - 29.5 mg/dL   Total Protein 7.1 6.5 - 8.1 g/dL    Albumin 3.4 (L) 3.5 - 5.0 g/dL   AST 90 (H) 15 - 41 U/L   ALT 65 (H) 0 - 44 U/L   Alkaline Phosphatase 92 38 - 126 U/L   Total Bilirubin 1.6 (H) 0.3 - 1.2 mg/dL   GFR calc non Af Amer >60 >60 mL/min   GFR calc Af Amer >60 >60 mL/min   Anion gap 7 5 - 15    Comment: Performed at [facility], [address], [place], [place] [address]  CBC     Status: Abnormal   Collection Time: 04/24/18  5:01 PM  Result Value Ref Range   WBC 5.9 4.0 - 10.5 K/uL   RBC 4.86 3.87 - 5.11 MIL/uL   Hemoglobin 10.4 (L) 12.0 - 15.0 g/dL   HCT 24.4 (L) 01.0 - 27.2 %   MCV 72.6 (L) 80.0 - 100.0 fL   MCH 21.4 (L) 26.0 - 34.0 pg   MCHC 29.5 (L) 30.0 - 36.0 g/dL   RDW 53.6 (H) 64.4 - 03.4 %   Platelets 179 150 - 400 K/uL   nRBC 0.0 0.0 - 0.2 %    Comment: Performed at [facility], [address] [place], [place] [address]  I-Stat beta hCG blood, ED     Status: Abnormal   Collection Time: 04/24/18  5:07 PM  Result Value Ref Range   I-stat hCG, quantitative >2,000.0 (H) <5 mIU/mL  Comment 3            Comment:   GEST. AGE      CONC.  (mIU/mL)   <=1 WEEK        5 - 50     2 WEEKS       50 - 500     3 WEEKS       100 - 10,000     4 WEEKS     1,000 - 30,000        FEMALE AND NON-PREGNANT FEMALE:     LESS THAN 5 mIU/mL   Urinalysis, Routine w reflex microscopic     Status: Abnormal   Collection Time: 04/24/18  7:16 PM  Result Value Ref Range   Color, Urine AMBER (A) YELLOW    Comment: BIOCHEMICALS MAY BE AFFECTED BY COLOR   APPearance CLEAR CLEAR   Specific Gravity, Urine 1.024 1.005 - 1.030   pH 7.0 5.0 - 8.0   Glucose, UA NEGATIVE NEGATIVE mg/dL   Hgb urine dipstick NEGATIVE NEGATIVE   Bilirubin Urine SMALL (A) NEGATIVE   Ketones, ur NEGATIVE NEGATIVE mg/dL   Protein, ur NEGATIVE NEGATIVE mg/dL   Nitrite NEGATIVE NEGATIVE   Leukocytes,Ua NEGATIVE NEGATIVE    Comment: Performed at [facility], [address] [place], [place] [address]     US Abdomen Limited Ruq  Result Date: 04/24/2018 CLINICAL DATA:  Right upper quadrant pain EXAM: ULTRASOUND ABDOMEN LIMITED RIGHT UPPER QUADRANT COMPARISON:  Ultrasound abdomen 01/04/2018 FINDINGS: Gallbladder: Gallbladder is better distended on the current study compared to the prior study. Numerous gallstones measuring up to 9 mm. Gallbladder wall non thickened. Mild tenderness over the gallbladder during scanning. Common bile duct: Diameter: 7.0 mm, upper normal Liver: No focal lesion identified. Within normal limits in parenchymal echogenicity. Portal vein is patent on color Doppler imaging with normal direction of blood flow towards the liver. IMPRESSION: Cholelithiasis. No gallbladder wall thickening but mild tenderness over the gallbladder. Common bile duct 7 mm Electronically Signed   By: Marlan Palau M.D.   On: 04/24/2018 18:30   Danielle [name] is an 33 y.o. female currently [redacted]wks pregnant with acute cholecystitis; possible choledocholithiasis given CBD 7mm and mild elevation of LFTs; could also be 2/2 pregnancy  -Admit to surgery -NPO after midnight tonight -Repeat labs in AM -We discussed her diagnosis, plan of care moving forward and the likely surgery this admission to remove her gallbladder. We discussed the acute care surgery model and fact that one of my partners will be performing her surgery as well but will discuss all with her as well -The anatomy and physiology of the hepatobiliary system was discussed at length with the patient with associated pictures. The pathophysiology of gallbladder disease was discussed at length as well. -Depending on lab work, we discussed potentially proceeding with cholecystectomy, possible IOC -The procedure, material risks (including, but not limited to, pain, bleeding, infection, scarring, need for blood transfusion, damage to surrounding structures- blood vessels/nerves/viscus/organs, damage to bile duct, bile leak, need for additional  procedures, hernia, worsening of pre-existing medical conditions, pancreatitis, pneumonia, heart attack, stroke, death) benefits and alternatives to surgery were discussed at length. The patient's questions were answered to her satisfaction, she voiced understanding and they elected to proceed with surgery. Additionally, we discussed typical postoperative expectations and the recovery process.  Stephanie Coup. Cliffton Asters, M.D. [facility]

## 2018-04-24 NOTE — ED Triage Notes (Signed)
Pt reports RUQ and epigastric pain since last night.  Hx of gallstones.  She also endorses n/v.

## 2018-04-24 NOTE — ED Notes (Signed)
ED TO INPATIENT HANDOFF REPORT  ED Nurse Name and Phone #: Elonda Husky [phone]  S Name/Age/Gender Danielle [name] 33 y.o. female Room/Bed: WA15/WA15  Code Status   Code Status: Prior  Home/SNF/Other Home Patient oriented to: self, place, time and situation Is this baseline? Yes   Triage Complete: Triage complete  Chief Complaint Abd pain  Triage Note Pt reports RUQ and epigastric pain since last night.  Hx of gallstones.  She also endorses n/v.     Allergies Allergies  Allergen Reactions  . Latex Hives and Rash    Level of Care/Admitting Diagnosis ED Disposition    ED Disposition Condition Comment   Admit  Hospital [facility]: [facility] [[id number]]  Level of Care: Med-Surg [16]  Diagnosis: Acute cholecystitis [575.0.ICD-9-CM]  Admitting Physician: CCS, MD [phone]  Attending Physician: CCS, MD [[id number]]  Estimated length of stay: past midnight tomorrow  Certification:: I certify this patient will need inpatient services for at least 2 midnights  PT Class (Do Not Modify): Inpatient [101]  PT Acc Code (Do Not Modify): Private [1]       B Medical/Surgery History Past Medical History:  Diagnosis Date  . Anxiety   . Depression   . Dizzy spells   . Endometriosis   . Foot fracture, right 2011  . Headache(784.0)   . Hypertension    on meds now  . Hypothyroidism   . Leg fracture, right 1998  . Pregnancy induced hypertension 2016  . PTSD (post-traumatic stress disorder)   . Trauma   . Vaginal Pap smear, abnormal 2017   did not f/u as instructed   Past Surgical History:  Procedure Laterality Date  . DILATION AND CURETTAGE OF UTERUS  2008  . TOOTH EXTRACTION       A IV Location/Drains/Wounds Patient Lines/Drains/Airways Status   Active Line/Drains/Airways    Name:   Placement date:   Placement time:   Site:   Days:   Peripheral IV 04/24/18 Left Hand   04/24/18    1732    Hand   less than 1          Intake/Output Last 24 hours No intake or  output data in the 24 hours ending 04/24/18 2131  Labs/Imaging Results for orders placed or performed during the hospital encounter of 04/24/18 (from the past 48 hour(s))  Lipase, blood     Status: None   Collection Time: 04/24/18  5:01 PM  Result Value Ref Range   Lipase 30 11 - 51 U/L    Comment: Performed at [facility], [address] [place], [place] [address]  Comprehensive metabolic panel     Status: Abnormal   Collection Time: 04/24/18  5:01 PM  Result Value Ref Range   Sodium 139 135 - 145 mmol/L   Potassium 3.6 3.5 - 5.1 mmol/L   Chloride 109 98 - 111 mmol/L   CO2 23 22 - 32 mmol/L   Glucose, Bld 115 (H) 70 - 99 mg/dL   BUN 7 6 - 20 mg/dL   Creatinine, Ser 8.10 0.44 - 1.00 mg/dL   Calcium 9.0 8.9 - 17.5 mg/dL   Total Protein 7.1 6.5 - 8.1 g/dL   Albumin 3.4 (L) 3.5 - 5.0 g/dL   AST 90 (H) 15 - 41 U/L   ALT 65 (H) 0 - 44 U/L   Alkaline Phosphatase 92 38 - 126 U/L   Total Bilirubin 1.6 (H) 0.3 - 1.2 mg/dL   GFR calc non Af Amer >60 >  60 mL/min   GFR calc Af Amer >60 >60 mL/min   Anion gap 7 5 - 15    Comment: Performed at [facility], [address], [place], [place] [address]  CBC     Status: Abnormal   Collection Time: 04/24/18  5:01 PM  Result Value Ref Range   WBC 5.9 4.0 - 10.5 K/uL   RBC 4.86 3.87 - 5.11 MIL/uL   Hemoglobin 10.4 (L) 12.0 - 15.0 g/dL   HCT 80.0 (L) 34.9 - 17.9 %   MCV 72.6 (L) 80.0 - 100.0 fL   MCH 21.4 (L) 26.0 - 34.0 pg   MCHC 29.5 (L) 30.0 - 36.0 g/dL   RDW 15.0 (H) 56.9 - 79.4 %   Platelets 179 150 - 400 K/uL   nRBC 0.0 0.0 - 0.2 %    Comment: Performed at [facility], [address] [place], [place] [address]  I-Stat beta hCG blood, ED     Status: Abnormal   Collection Time: 04/24/18  5:07 PM  Result Value Ref Range   I-stat hCG, quantitative >2,000.0 (H) <5 mIU/mL   Comment 3            Comment:   GEST. AGE      CONC.  (mIU/mL)   <=1 WEEK        5 - 50     2 WEEKS        50 - 500     3 WEEKS       100 - 10,000     4 WEEKS     1,000 - 30,000        FEMALE AND NON-PREGNANT FEMALE:     LESS THAN 5 mIU/mL   Urinalysis, Routine w reflex microscopic     Status: Abnormal   Collection Time: 04/24/18  7:16 PM  Result Value Ref Range   Color, Urine AMBER (A) YELLOW    Comment: BIOCHEMICALS MAY BE AFFECTED BY COLOR   APPearance CLEAR CLEAR   Specific Gravity, Urine 1.024 1.005 - 1.030   pH 7.0 5.0 - 8.0   Glucose, UA NEGATIVE NEGATIVE mg/dL   Hgb urine dipstick NEGATIVE NEGATIVE   Bilirubin Urine SMALL (A) NEGATIVE   Ketones, ur NEGATIVE NEGATIVE mg/dL   Protein, ur NEGATIVE NEGATIVE mg/dL   Nitrite NEGATIVE NEGATIVE   Leukocytes,Ua NEGATIVE NEGATIVE    Comment: Performed at [facility], [address] [place], [place] [address]   US Abdomen Limited Ruq  Result Date: 04/24/2018 CLINICAL DATA:  Right upper quadrant pain EXAM: ULTRASOUND ABDOMEN LIMITED RIGHT UPPER QUADRANT COMPARISON:  Ultrasound abdomen 01/04/2018 FINDINGS: Gallbladder: Gallbladder is better distended on the current study compared to the prior study. Numerous gallstones measuring up to 9 mm. Gallbladder wall non thickened. Mild tenderness over the gallbladder during scanning. Common bile duct: Diameter: 7.0 mm, upper normal Liver: No focal lesion identified. Within normal limits in parenchymal echogenicity. Portal vein is patent on color Doppler imaging with normal direction of blood flow towards the liver. IMPRESSION: Cholelithiasis. No gallbladder wall thickening but mild tenderness over the gallbladder. Common bile duct 7 mm Electronically Signed   By: Marlan Palau M.D.   On: 04/24/2018 18:30    Pending Labs [employer] (From admission, onward)    Start     Ordered   Signed and Held  Comprehensive metabolic panel  Tomorrow morning,   R     Signed and Held   Signed and Held  CBC WITH DIFFERENTIAL  Tomorrow morning,   R     Signed and Held           Vitals/Pain Today's Vitals   04/24/18 1650 04/24/18 1940 04/24/18 2115 04/24/18 2115  BP:  138/77 139/74   Pulse:  95 89   Resp:  16 16   Temp:  98.6 F (37 C) 99.2 F (37.3 C)   TempSrc:  Oral Oral   SpO2:  100% 100%   PainSc: 10-Worst pain ever 5   5     Isolation Precautions No active isolations  Medications Medications  piperacillin-tazobactam (ZOSYN) IVPB 3.375 g (3.375 g Intravenous New Bag/Given 04/24/18 2114)  sodium chloride flush (NS) 0.9 % injection 3 mL (3 mLs Intravenous Given 04/24/18 1734)  morphine 4 MG/ML injection 4 mg (4 mg Intravenous Given 04/24/18 1732)  lactated ringers bolus 1,000 mL (0 mLs Intravenous Stopped 04/24/18 1955)  0.9 %  sodium chloride infusion ( Intravenous New Bag/Given 04/24/18 2043)    Mobility walks Low fall risk   Focused Assessments a/o, abdominal pain 6/10, [redacted] weeks gestation   R Recommendations: See Admitting Provider Note  Report given to:   Additional Notes:

## 2018-04-24 NOTE — ED Notes (Signed)
Transport notified of need for transport.  

## 2018-04-24 NOTE — ED Notes (Signed)
Consulting provider at bedside
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
## 2018-04-25 DIAGNOSIS — K810 Acute cholecystitis: Secondary | ICD-10-CM

## 2018-04-25 LAB — CBC WITH DIFFERENTIAL/PLATELET
Abs Immature Granulocytes: 0.02 10*3/uL (ref 0.00–0.07)
Basophils Absolute: 0 10*3/uL (ref 0.0–0.1)
Basophils Relative: 0 %
Eosinophils Absolute: 0.1 10*3/uL (ref 0.0–0.5)
Eosinophils Relative: 3 %
HCT: 29.8 % — ABNORMAL LOW (ref 36.0–46.0)
Hemoglobin: 8.7 g/dL — ABNORMAL LOW (ref 12.0–15.0)
IMMATURE GRANULOCYTES: 0 %
LYMPHS PCT: 38 %
Lymphs Abs: 2 10*3/uL (ref 0.7–4.0)
MCH: 21.1 pg — ABNORMAL LOW (ref 26.0–34.0)
MCHC: 29.2 g/dL — ABNORMAL LOW (ref 30.0–36.0)
MCV: 72.2 fL — AB (ref 80.0–100.0)
Monocytes Absolute: 0.5 10*3/uL (ref 0.1–1.0)
Monocytes Relative: 9 %
Neutro Abs: 2.5 10*3/uL (ref 1.7–7.7)
Neutrophils Relative %: 50 %
PLATELETS: 152 10*3/uL (ref 150–400)
RBC: 4.13 MIL/uL (ref 3.87–5.11)
RDW: 19.9 % — ABNORMAL HIGH (ref 11.5–15.5)
WBC: 5.1 10*3/uL (ref 4.0–10.5)
nRBC: 0 % (ref 0.0–0.2)

## 2018-04-25 LAB — COMPREHENSIVE METABOLIC PANEL
ALT: 44 U/L (ref 0–44)
AST: 49 U/L — ABNORMAL HIGH (ref 15–41)
Albumin: 2.8 g/dL — ABNORMAL LOW (ref 3.5–5.0)
Alkaline Phosphatase: 76 U/L (ref 38–126)
Anion gap: 7 (ref 5–15)
BUN: 7 mg/dL (ref 6–20)
CHLORIDE: 108 mmol/L (ref 98–111)
CO2: 21 mmol/L — ABNORMAL LOW (ref 22–32)
Calcium: 8.2 mg/dL — ABNORMAL LOW (ref 8.9–10.3)
Creatinine, Ser: 0.4 mg/dL — ABNORMAL LOW (ref 0.44–1.00)
GFR calc Af Amer: >60 mL/min (ref >60)
GFR calc non Af Amer: >60 mL/min (ref >60)
Glucose, Bld: 79 mg/dL (ref 70–99)
Potassium: 3.7 mmol/L (ref 3.5–5.1)
Sodium: 136 mmol/L (ref 135–145)
Total Bilirubin: 1.9 mg/dL — ABNORMAL HIGH (ref 0.3–1.2)
Total Protein: 5.8 g/dL — ABNORMAL LOW (ref 6.5–8.1)

## 2018-04-25 LAB — MRSA PCR SCREENING: MRSA by PCR: NEGATIVE

## 2018-04-25 NOTE — Discharge Instructions (Signed)
[facility] ______[facility] °LAPAROSCOPIC SURGERY: POST OP INSTRUCTIONS °Always review your discharge instruction sheet given to you by the facility where your surgery was performed. °IF YOU HAVE DISABILITY OR FAMILY LEAVE FORMS, YOU MUST BRING THEM TO THE OFFICE FOR PROCESSING.   °DO NOT GIVE THEM TO YOUR DOCTOR. ° °1. A prescription for pain medication may be given to you upon discharge.  Take your pain medication as prescribed, if needed.  If narcotic pain medicine is not needed, then you may take acetaminophen (Tylenol) or ibuprofen (Advil) as needed. °2. Take your usually prescribed medications unless otherwise directed. °3. If you need a refill on your pain medication, please contact your pharmacy.  They will contact our office to request authorization. Prescriptions will not be filled after 5pm or on week-ends. °4. You should follow a light diet the first few days after arrival home, such as soup and crackers, etc.  Be sure to include lots of fluids daily. °5. Most patients will experience some swelling and bruising in the area of the incisions.  Ice packs will help.  Swelling and bruising can take several days to resolve.  °6. It is common to experience some constipation if taking pain medication after surgery.  Increasing fluid intake and taking a stool softener (such as Colace) will usually help or prevent this problem from occurring.  A mild laxative (Milk of Magnesia or Miralax) should be taken according to package instructions if there are no bowel movements after 48 hours. °7. Unless discharge instructions indicate otherwise, you may remove your bandages 24-48 hours after surgery, and you may shower at that time.  You may have steri-strips (small skin tapes) in place directly over the incision.  These strips should be left on the skin for 7-10 days.  If your surgeon used skin glue on the incision, you may shower in 24 hours.  The glue will flake off over the next 2-3 weeks.  Any sutures or  staples will be removed at the office during your follow-up visit. °8. ACTIVITIES:  You may resume regular (light) daily activities beginning the next day--such as daily self-care, walking, climbing stairs--gradually increasing activities as tolerated.  You may have sexual intercourse when it is comfortable.  Refrain from any heavy lifting or straining until approved by your doctor. °a. You may drive when you are no longer taking prescription pain medication, you can comfortably wear a seatbelt, and you can safely maneuver your car and apply brakes. °b. RETURN TO WORK:  __________________________________________________________ °9. You should see your doctor in the office for a follow-up appointment approximately 2-3 weeks after your surgery.  Make sure that you call for this appointment within a day or two after you arrive home to insure a convenient appointment time. °10. OTHER INSTRUCTIONS: __________________________________________________________________________________________________________________________ __________________________________________________________________________________________________________________________ °WHEN TO CALL YOUR DOCTOR: °1. Fever over 101.0 °2. Inability to urinate °3. Continued bleeding from incision. °4. Increased pain, redness, or drainage from the incision. °5. Increasing abdominal pain ° °The clinic staff is available to answer your questions during regular business hours.  Please don’t hesitate to call and ask to speak to one of the nurses for clinical concerns.  If you have a medical emergency, go to the nearest emergency room or call 911.  A surgeon from [facility] is always on call at the hospital. °[address], [address], [place], [place]  [address] ? [address] [address], [place], [place]   [address] °[phone] ? [phone] ? FAX [phone] °Web site:   [email or web address] °

## 2018-04-25 NOTE — Consult Note (Addendum)
Consultation  Referring Provider: Dr. Dema Severin     Primary Care Physician:  [facility] Gastroenterologist: Althia Forts        Reason for Consultation:  Elevated LFT's, Cholelithiasis            HPI:   Danielle [name] is a 33 y.o. female currently [redacted] weeks pregnant ([place], P5) who presented to the ER on 04/24/2018 complaining of right upper quadrant pain.  We are consulted milligrams elevated LFTs and cholelithiasis.    Today, the patient describes that she started right upper quadrant pain about 48 hours ago.  Describes this pain as nonradiating and constant/sharp.  Associated symptoms include some nausea with the pain and vomiting about 24 hours ago, but none since admission.  Tells me she has had "gallbladder attacks off and on for years" but has always been pregnant, so has elected to wait for treatment.  This episode of pain was "worse than before" which brought her to the ER. Pain has continued.   Patient has 5 boys at home and is pregnant with 6th child (hoping for a girl).   Denies fever, chills, weight loss, anorexia, change in bowel habits or symptoms that awaken her from sleep.  ED course: AST 90, ALT 65, total bilirubin 1.6, hemoglobin 10.4, right upper quadrant ultrasound with cholelithiasis and no gallbladder wall thickening but mild tenderness over the gallbladder, common bile duct 7 mm  Past Medical History:  Diagnosis Date  . Anxiety   . Depression   . Dizzy spells   . Endometriosis   . Foot fracture, right 2011  . Headache(784.0)   . Hypertension    on meds now  . Hypothyroidism   . Leg fracture, right 1998  . Pregnancy induced hypertension 2016  . PTSD (post-traumatic stress disorder)   . Trauma   . Vaginal Pap smear, abnormal 2017   did not f/u as instructed    Past Surgical History:  Procedure Laterality Date  . DILATION AND CURETTAGE OF UTERUS  2008  . TOOTH EXTRACTION      Family History  Problem Relation Age of Onset  . Healthy Mother    . Diabetes Father   . Hypertension Father   . Diabetes Maternal Grandmother   . Cancer Maternal Grandmother   . Diabetes Maternal Grandfather   . Diabetes Paternal Grandmother   . Diabetes Paternal Grandfather   . Other Cousin        sickle cell disease  . Arthritis Other   . Asthma Other   . Early death Other        uncle-suicide  . Cataracts Other   . Congestive Heart Failure Other   . Hyperlipidemia Other   . Hypertension Other   . Stroke Other   . Migraines Other   . Anesthesia problems Neg Hx      Social History   Tobacco Use  . Smoking status: Never Smoker  . Smokeless tobacco: Never Used  Substance Use Topics  . Alcohol use: Not Currently    Alcohol/week: 0.0 standard drinks    Comment: occas.   . Drug use: Not Currently    Comment: no longer, last was first wk Nov    Prior to Admission medications   Medication Sig Start Date End Date Taking? Authorizing Provider  ARIPiprazole (ABILIFY) 10 MG tablet Take 1 tablet (10 mg total) by mouth daily. 12/17/17  Yes Pennelope Bracken, MD  aspirin 81 MG chewable tablet Chew 1 tablet (81 mg  total) by mouth daily. 03/24/18  Yes Jorje Guild, NP  benztropine (COGENTIN) 1 MG tablet Take 1 mg by mouth at bedtime.  04/08/18  Yes [provider]  gabapentin (NEURONTIN) 300 MG capsule Take 300 mg by mouth daily as needed (anxiety).  04/14/18  Yes [provider]  labetalol (NORMODYNE) 100 MG tablet Take 1 tablet (100 mg total) by mouth 2 (two) times daily. 03/24/18  Yes Jorje Guild, NP  terconazole (TERAZOL 3) 0.8 % vaginal cream Place 1 applicator vaginally at bedtime. Patient not taking: Reported on 04/24/2018 03/24/18   Jorje Guild, NP    Current Facility-Administered Medications  Medication Dose Route Frequency Provider Last Rate Last Dose  . acetaminophen (TYLENOL) tablet 1,000 mg  1,000 mg Oral Q6H White, Sharon Mt, MD      . ARIPiprazole (ABILIFY) tablet 10 mg  10 mg Oral Daily Ileana Roup, MD      . aspirin chewable tablet 81 mg  81 mg Oral Daily Ileana Roup, MD      . diphenhydrAMINE (BENADRYL) 12.5 MG/5ML elixir 12.5 mg  12.5 mg Oral Q6H PRN Ileana Roup, MD       Or  . diphenhydrAMINE (BENADRYL) injection 12.5 mg  12.5 mg Intravenous Q6H PRN Ileana Roup, MD      . heparin injection 5,000 Units  5,000 Units Subcutaneous Q8H Ileana Roup, MD   5,000 Units at 04/25/18 0515  . HYDROmorphone (DILAUDID) injection 0.5 mg  0.5 mg Intravenous Q3H PRN Ileana Roup, MD   0.5 mg at 04/25/18 0505  . labetalol (NORMODYNE) tablet 100 mg  100 mg Oral BID Ileana Roup, MD   100 mg at 04/24/18 2259  . lactated ringers infusion   Intravenous Continuous Ileana Roup, MD 100 mL/hr at 04/24/18 2321    . ondansetron (ZOFRAN-ODT) disintegrating tablet 4 mg  4 mg Oral Q6H PRN Ileana Roup, MD       Or  . ondansetron [facility]) injection 4 mg  4 mg Intravenous Q6H PRN Ileana Roup, MD   4 mg at 04/25/18 0515  . oxyCODONE (Oxy IR/ROXICODONE) immediate release tablet 5 mg  5 mg Oral Q6H PRN Ileana Roup, MD   5 mg at 04/24/18 2259  . piperacillin-tazobactam (ZOSYN) IVPB 3.375 g  3.375 g Intravenous Q8H Ileana Roup, MD 12.5 mL/hr at 04/25/18 0509 3.375 g at 04/25/18 0509  . simethicone (MYLICON) chewable tablet 40 mg  40 mg Oral Q6H PRN Ileana Roup, MD        Allergies as of 04/24/2018 - Review Complete 04/24/2018  Allergen Reaction Noted  . Latex Hives and Rash 10/23/2012     Review of Systems:    Constitutional: No weight loss, fever or chills Skin: No rash  Cardiovascular: No chest pain  Respiratory: No SOB  Gastrointestinal: See HPI and otherwise negative Genitourinary: No dysuria  Neurological: No headache Musculoskeletal: No new muscle or joint pain Hematologic: No bleeding Psychiatric: No history of depression or anxiety    Physical Exam:  Vital signs in last 24 hours:  Temp:  [97.7 F (36.5 C)-99.2 F (37.3 C)] 97.8 F (36.6 C) (03/20 0518) Pulse Rate:  [80-102] 80 (03/20 0518) Resp:  [16-20] 20 (03/20 0518) BP: (120-139)/(60-77) 120/61 (03/20 0518) SpO2:  [98 %-100 %] 98 % (03/20 0518) Last BM Date: 04/23/18 General:   Pleasant AA female appears to be in NAD, Well developed, Well nourished, alert and cooperative Head:  Normocephalic and atraumatic. Eyes:   PEERL, EOMI. No icterus. Conjunctiva pink. Ears:  Normal auditory acuity. Neck:  Supple Throat: Oral cavity and pharynx without inflammation, swelling or lesion.  Lungs: Respirations even and unlabored. Lungs clear to auscultation bilaterally.   No wheezes, crackles, or rhonchi.  Heart: Normal S1, S2. No MRG. Regular rate and rhythm. No peripheral edema, cyanosis or pallor.  Abdomen:  Soft, nondistended, moderate RUQ ttp, mild epigastric ttp, No rebound or guarding. Normal bowel sounds. No appreciable masses or hepatomegaly. Rectal:  Not performed.  Msk:  Symmetrical without gross deformities. Peripheral pulses intact.  Extremities:  Without edema, no deformity or joint abnormality Neurologic:  Alert and  oriented x4;  grossly normal neurologically. .  Skin:   Dry and intact without significant lesions or rashes. Psychiatric: Demonstrates good judgement and reason without abnormal affect or behaviors.  LAB RESULTS: Recent Labs    04/24/18 1701 04/25/18 0534  WBC 5.9 5.1  HGB 10.4* 8.7*  HCT 35.3* 29.8*  PLT 179 152   BMET Recent Labs    04/24/18 1701 04/25/18 0534  NA 139 136  K 3.6 3.7  CL 109 108  CO2 23 21*  GLUCOSE 115* 79  BUN 7 7  CREATININE 0.51 0.40*  CALCIUM 9.0 8.2*   LFT Recent Labs    04/25/18 0534  PROT 5.8*  ALBUMIN 2.8*  AST 49*  ALT 44  ALKPHOS 76  BILITOT 1.9*   STUDIES: US Abdomen Limited Ruq  Result Date: 04/24/2018 CLINICAL DATA:  Right upper quadrant pain EXAM: ULTRASOUND ABDOMEN LIMITED RIGHT UPPER QUADRANT COMPARISON:  Ultrasound abdomen  01/04/2018 FINDINGS: Gallbladder: Gallbladder is better distended on the current study compared to the prior study. Numerous gallstones measuring up to 9 mm. Gallbladder wall non thickened. Mild tenderness over the gallbladder during scanning. Common bile duct: Diameter: 7.0 mm, upper normal Liver: No focal lesion identified. Within normal limits in parenchymal echogenicity. Portal vein is patent on color Doppler imaging with normal direction of blood flow towards the liver. IMPRESSION: Cholelithiasis. No gallbladder wall thickening but mild tenderness over the gallbladder. Common bile duct 7 mm Electronically Signed   By: Franchot Gallo M.D.   On: 04/24/2018 18:30    Impression / Plan:   Impression: 1.  Elevated LFTs: AST 90--> 49, ALT 65--> 44, alk phos92--> 76, total bilirubin 1.6--> 1.9; consider cholelithiasis +/- choledocholithiasis 2.  Cholelithiasis: As on ultrasound above 3.  Right upper quadrant pain: Likely due to above 4.  Pregnant: 18 weeks 5. Anemia: hgb 16.1-->0.9, uncertain etiology  Plan: 1. Discussed with Dr. Lyndel Safe. He recommends that surgical team go ahead with lap choley and IOC. If IOC is positive, please call us back. 2. Continue to monitor LFT's 3. Did discuss with Will Creig Hines, surgical PA 4. Please await any final recommendations from Dr. Lyndel Safe later this morning  Thank you for your kind consultation, we will likely sign off, but please call if IOC positive or we can be of further assistance.  Lavone Nian [facility]  04/25/2018, 9:35 AM   Attending physician's note   I have taken an interval history, reviewed the chart and examined the patient. I agree with the Advanced Practitioner's note, impression and recommendations.   33 year old BF [redacted] weeks pregnant  (G8P5) with biliary colic, mildly abnormal LFTs which are getting better.  US shows cholelithiasis without cholecystitis and normal CBD. Did drop hemoglobin 10.4 to 8.7 without any overt bleeding, likely  dilutional. Recommend: Proceed with lap chole with IOC  as planned.  If IOC is positive, would consider ERCP.  Trend LFTs, CBC.  Discussed with the patient.  Carmell Austria, MD

## 2018-04-25 NOTE — Progress Notes (Signed)
[facility] Progress Note     Subjective: CC-  Continues to have intermittent RUQ abdominal pain radiating into her back.  WBC 5.1, afebrile. Transaminases trending down but Tbili slightly up 1.9 from 1.6.  Objective: Vital signs in last 24 hours: Temp:  [97.7 F (36.5 C)-99.2 F (37.3 C)] 97.8 F (36.6 C) (03/20 0518) Pulse Rate:  [80-102] 80 (03/20 0518) Resp:  [16-20] 20 (03/20 0518) BP: (120-139)/(60-77) 120/61 (03/20 0518) SpO2:  [98 %-100 %] 98 % (03/20 0518) Last BM Date: 04/23/18  Intake/Output from previous day: 03/19 0701 - 03/20 0700 In: 819.1 [P.O.:120; I.V.:637.4; IV Piggyback:61.6] Out: -  Intake/Output this shift: No intake/output data recorded.  PE: Gen:  Alert, NAD, pleasant HEENT: EOM's intact, pupils equal and round Card:  RRR Pulm:  CTAB, no W/R/R, effort normal Abd: Soft, gravid, +BS, moderate RUQ TTP, no HSM Ext: no BLE edema Psych: A&Ox3  Skin: no rashes noted, warm and dry  Lab Results:  Recent Labs    04/24/18 1701 04/25/18 0534  WBC 5.9 5.1  HGB 10.4* 8.7*  HCT 35.3* 29.8*  PLT 179 152   BMET Recent Labs    04/24/18 1701 04/25/18 0534  NA 139 136  K 3.6 3.7  CL 109 108  CO2 23 21*  GLUCOSE 115* 79  BUN 7 7  CREATININE 0.51 0.40*  CALCIUM 9.0 8.2*   PT/INR No results for input(s): LABPROT, INR in the last 72 hours. CMP     Component Value Date/Time   NA 136 04/25/2018 0534   NA 137 07/03/2017 1517   K 3.7 04/25/2018 0534   CL 108 04/25/2018 0534   CO2 21 (L) 04/25/2018 0534   GLUCOSE 79 04/25/2018 0534   BUN 7 04/25/2018 0534   BUN 6 07/03/2017 1517   CREATININE 0.40 (L) 04/25/2018 0534   CREATININE 0.65 04/08/2015 1549   CALCIUM 8.2 (L) 04/25/2018 0534   PROT 5.8 (L) 04/25/2018 0534   PROT 6.5 07/03/2017 1517   ALBUMIN 2.8 (L) 04/25/2018 0534   ALBUMIN 3.9 07/03/2017 1517   AST 49 (H) 04/25/2018 0534   ALT 44 04/25/2018 0534   ALKPHOS 76 04/25/2018 0534   BILITOT 1.9 (H) 04/25/2018 0534   BILITOT 0.2 07/03/2017 1517   GFRNONAA >60 04/25/2018 0534   GFRNONAA >89 02/18/2014 1212   GFRAA >60 04/25/2018 0534   GFRAA >89 02/18/2014 1212   Lipase     Component Value Date/Time   LIPASE 30 04/24/2018 1701       Studies/Results: US Abdomen Limited Ruq  Result Date: 04/24/2018 CLINICAL DATA:  Right upper quadrant pain EXAM: ULTRASOUND ABDOMEN LIMITED RIGHT UPPER QUADRANT COMPARISON:  Ultrasound abdomen 01/04/2018 FINDINGS: Gallbladder: Gallbladder is better distended on the current study compared to the prior study. Numerous gallstones measuring up to 9 mm. Gallbladder wall non thickened. Mild tenderness over the gallbladder during scanning. Common bile duct: Diameter: 7.0 mm, upper normal Liver: No focal lesion identified. Within normal limits in parenchymal echogenicity. Portal vein is patent on color Doppler imaging with normal direction of blood flow towards the liver. IMPRESSION: Cholelithiasis. No gallbladder wall thickening but mild tenderness over the gallbladder. Common bile duct 7 mm Electronically Signed   By: Marlan Palau M.D.   On: 04/24/2018 18:30    Anti-infectives: Anti-infectives (From admission, onward)   Start     Dose/Rate Route Frequency Ordered Stop   04/24/18 2130  piperacillin-tazobactam (ZOSYN) IVPB 3.375 g     3.375 g 12.5 mL/hr over 240  Minutes Intravenous Every 8 hours 04/24/18 2107         Assessment/Plan [redacted]wks pregnant  HTN Anxiety/depression  Acute cholecystitis Elevated LFTs, ?choledocholithiasis - Transaminases trending down but Tbili slightly up this AM, 1.9 from 1.6. We have asked GI to see for recommendation on MRCP vs ERCP, or proceed with lap chole with IOC. Keep NPO for now. Continue IV abx.  ID - zosyn 3/19>> FEN - IVF, NPO VTE - SCDs, sq heparin Foley - none Follow up - TBD   LOS: 1 day    Franne Forts , [facility] 04/25/2018, 10:12 AM Pager: [phone] Mon-Thurs 7:00 am-4:30 pm Fri 7:00 am  -11:30 AM Sat-Sun 7:00 am-11:30 am

## 2018-04-26 ENCOUNTER — Inpatient Hospital Stay (HOSPITAL_COMMUNITY): Payer: Medicaid Other | Admitting: Anesthesiology

## 2018-04-26 ENCOUNTER — Inpatient Hospital Stay (HOSPITAL_COMMUNITY): Payer: Medicaid Other

## 2018-04-26 ENCOUNTER — Encounter (HOSPITAL_COMMUNITY): Payer: Self-pay | Admitting: Registered Nurse

## 2018-04-26 ENCOUNTER — Encounter (HOSPITAL_COMMUNITY): Admission: EM | Disposition: A | Discharge status: Home / Self Care | Payer: Self-pay | Source: Home / Self Care

## 2018-04-26 HISTORY — PX: CHOLECYSTECTOMY: SHX55

## 2018-04-26 LAB — COMPREHENSIVE METABOLIC PANEL WITH GFR
ALT: 35 U/L (ref 0–44)
AST: 28 U/L (ref 15–41)
Albumin: 2.9 g/dL — ABNORMAL LOW (ref 3.5–5.0)
Alkaline Phosphatase: 73 U/L (ref 38–126)
Anion gap: 8 (ref 5–15)
BUN: <5 mg/dL — ABNORMAL LOW (ref 6–20)
CO2: 21 mmol/L — ABNORMAL LOW (ref 22–32)
Calcium: 8.5 mg/dL — ABNORMAL LOW (ref 8.9–10.3)
Chloride: 106 mmol/L (ref 98–111)
Creatinine, Ser: 0.39 mg/dL — ABNORMAL LOW (ref 0.44–1.00)
GFR calc Af Amer: >60 mL/min
GFR calc non Af Amer: >60 mL/min
Glucose, Bld: 81 mg/dL (ref 70–99)
Potassium: 3.7 mmol/L (ref 3.5–5.1)
Sodium: 135 mmol/L (ref 135–145)
Total Bilirubin: 2.7 mg/dL — ABNORMAL HIGH (ref 0.3–1.2)
Total Protein: 5.9 g/dL — ABNORMAL LOW (ref 6.5–8.1)

## 2018-04-26 LAB — CBC
HCT: 30 % — ABNORMAL LOW (ref 36.0–46.0)
Hemoglobin: 8.7 g/dL — ABNORMAL LOW (ref 12.0–15.0)
MCH: 21.2 pg — ABNORMAL LOW (ref 26.0–34.0)
MCHC: 29 g/dL — ABNORMAL LOW (ref 30.0–36.0)
MCV: 73 fL — ABNORMAL LOW (ref 80.0–100.0)
Platelets: 154 K/uL (ref 150–400)
RBC: 4.11 MIL/uL (ref 3.87–5.11)
RDW: 19.9 % — ABNORMAL HIGH (ref 11.5–15.5)
WBC: 4.4 K/uL (ref 4.0–10.5)
nRBC: 0 % (ref 0.0–0.2)

## 2018-04-26 SURGERY — LAPAROSCOPIC CHOLECYSTECTOMY
Anesthesia: General | Site: Abdomen

## 2018-04-26 MED ORDER — ONDANSETRON HCL 4 MG/2ML IJ SOLN
INTRAMUSCULAR | Status: DC | PRN
  Administered 2018-04-26: 4 mg via INTRAVENOUS

## 2018-04-26 MED ORDER — FENTANYL CITRATE (PF) 250 MCG/5ML IJ SOLN
INTRAMUSCULAR | Status: DC | PRN
  Administered 2018-04-26 (×2): 100 ug via INTRAVENOUS
  Administered 2018-04-26: 50 ug via INTRAVENOUS

## 2018-04-26 MED ORDER — LACTATED RINGERS IV SOLN: INTRAVENOUS | Status: DC

## 2018-04-26 MED ORDER — BUPIVACAINE-EPINEPHRINE (PF) 0.25% -1:200000 IJ SOLN
INTRAMUSCULAR | Status: AC
  Filled 2018-04-26: qty 30

## 2018-04-26 MED ORDER — SUGAMMADEX SODIUM 500 MG/5ML IV SOLN
INTRAVENOUS | Status: DC | PRN
  Administered 2018-04-26: 400 mg via INTRAVENOUS

## 2018-04-26 MED ORDER — LIDOCAINE 2% (20 MG/ML) 5 ML SYRINGE
INTRAMUSCULAR | Status: DC | PRN
  Administered 2018-04-26: 50 mg via INTRAVENOUS

## 2018-04-26 MED ORDER — ROCURONIUM BROMIDE 10 MG/ML (PF) SYRINGE
PREFILLED_SYRINGE | INTRAVENOUS | Status: AC
  Filled 2018-04-26: qty 10

## 2018-04-26 MED ORDER — PROPOFOL 10 MG/ML IV BOLUS
INTRAVENOUS | Status: AC
  Filled 2018-04-26: qty 20

## 2018-04-26 MED ORDER — SUCCINYLCHOLINE CHLORIDE 200 MG/10ML IV SOSY
PREFILLED_SYRINGE | INTRAVENOUS | Status: AC
  Filled 2018-04-26: qty 10

## 2018-04-26 MED ORDER — FENTANYL CITRATE (PF) 250 MCG/5ML IJ SOLN
INTRAMUSCULAR | Status: AC
  Filled 2018-04-26: qty 5

## 2018-04-26 MED ORDER — PROPOFOL 10 MG/ML IV BOLUS
INTRAVENOUS | Status: DC | PRN
  Administered 2018-04-26: 130 mg via INTRAVENOUS

## 2018-04-26 MED ORDER — ACETAMINOPHEN 10 MG/ML IV SOLN
1000.0000 mg | Freq: Once | INTRAVENOUS | Status: DC | PRN
  Administered 2018-04-26: 1000 mg via INTRAVENOUS

## 2018-04-26 MED ORDER — 0.9 % SODIUM CHLORIDE (POUR BTL) OPTIME
TOPICAL | Status: DC | PRN
  Administered 2018-04-26: 1000 mL

## 2018-04-26 MED ORDER — HYDROMORPHONE HCL 1 MG/ML IJ SOLN: 0.2500 mg | INTRAMUSCULAR | Status: DC | PRN

## 2018-04-26 MED ORDER — PROMETHAZINE HCL 25 MG/ML IJ SOLN
6.2500 mg | INTRAMUSCULAR | Status: DC | PRN
  Administered 2018-04-26: 6.25 mg via INTRAVENOUS

## 2018-04-26 MED ORDER — MUPIROCIN 2 % EX OINT: 1.0000 "application " | TOPICAL_OINTMENT | Freq: Two times a day (BID) | CUTANEOUS | Status: DC

## 2018-04-26 MED ORDER — SUCCINYLCHOLINE CHLORIDE 200 MG/10ML IV SOSY
PREFILLED_SYRINGE | INTRAVENOUS | Status: DC | PRN
  Administered 2018-04-26: 120 mg via INTRAVENOUS

## 2018-04-26 MED ORDER — LACTATED RINGERS IV SOLN
INTRAVENOUS | Status: AC | PRN
  Administered 2018-04-26: 1000 mL

## 2018-04-26 MED ORDER — PROMETHAZINE HCL 25 MG/ML IJ SOLN
INTRAMUSCULAR | Status: AC
  Filled 2018-04-26: qty 1

## 2018-04-26 MED ORDER — ACETAMINOPHEN 160 MG/5ML PO SOLN: 325.0000 mg | Freq: Once | ORAL | Status: DC

## 2018-04-26 MED ORDER — MEPERIDINE HCL 50 MG/ML IJ SOLN: 6.2500 mg | INTRAMUSCULAR | Status: DC | PRN

## 2018-04-26 MED ORDER — IOPAMIDOL (ISOVUE-300) INJECTION 61%
INTRAVENOUS | Status: AC
  Filled 2018-04-26: qty 50

## 2018-04-26 MED ORDER — DEXAMETHASONE SODIUM PHOSPHATE 10 MG/ML IJ SOLN
INTRAMUSCULAR | Status: DC | PRN
  Administered 2018-04-26: 10 mg via INTRAVENOUS

## 2018-04-26 MED ORDER — ACETAMINOPHEN 10 MG/ML IV SOLN
INTRAVENOUS | Status: AC
  Filled 2018-04-26: qty 100

## 2018-04-26 MED ORDER — LIDOCAINE 2% (20 MG/ML) 5 ML SYRINGE
INTRAMUSCULAR | Status: AC
  Filled 2018-04-26: qty 5

## 2018-04-26 MED ORDER — ONDANSETRON HCL 4 MG/2ML IJ SOLN
4.0000 mg | Freq: Three times a day (TID) | INTRAMUSCULAR | Status: DC | PRN
  Administered 2018-04-26: 4 mg via INTRAVENOUS
  Filled 2018-04-26: qty 2

## 2018-04-26 MED ORDER — OXYCODONE HCL 5 MG PO TABS
5.0000 mg | ORAL_TABLET | Freq: Four times a day (QID) | ORAL | Status: DC | PRN
  Administered 2018-04-26 – 2018-04-27 (×3): 5 mg via ORAL
  Filled 2018-04-26 (×3): qty 1

## 2018-04-26 MED ORDER — DEXAMETHASONE SODIUM PHOSPHATE 10 MG/ML IJ SOLN
INTRAMUSCULAR | Status: AC
  Filled 2018-04-26: qty 1

## 2018-04-26 MED ORDER — ONDANSETRON HCL 4 MG/2ML IJ SOLN
INTRAMUSCULAR | Status: AC
  Filled 2018-04-26: qty 2

## 2018-04-26 MED ORDER — SUGAMMADEX SODIUM 500 MG/5ML IV SOLN
INTRAVENOUS | Status: AC
  Filled 2018-04-26: qty 5

## 2018-04-26 MED ORDER — ACETAMINOPHEN 325 MG PO TABS: 325.0000 mg | ORAL_TABLET | Freq: Once | ORAL | Status: DC

## 2018-04-26 MED ORDER — ROCURONIUM BROMIDE 10 MG/ML (PF) SYRINGE
PREFILLED_SYRINGE | INTRAVENOUS | Status: DC | PRN
  Administered 2018-04-26: 40 mg via INTRAVENOUS

## 2018-04-26 MED ORDER — BUPIVACAINE-EPINEPHRINE 0.25% -1:200000 IJ SOLN
INTRAMUSCULAR | Status: DC | PRN
  Administered 2018-04-26: 30 mL

## 2018-04-26 MED ORDER — MORPHINE SULFATE (PF) 2 MG/ML IV SOLN
2.0000 mg | INTRAVENOUS | Status: DC | PRN
  Filled 2018-04-26: qty 1

## 2018-04-26 SURGICAL SUPPLY — 46 items
ADH SKN CLS APL DERMABOND .7 (GAUZE/BANDAGES/DRESSINGS)
APL SKNCLS STERI-STRIP NONHPOA (GAUZE/BANDAGES/DRESSINGS) ×1
APPLIER CLIP ROT 10 11.4 M/L (STAPLE)
APR CLP MED LRG 11.4X10 (STAPLE)
BAG SPEC RTRVL 10 TROC 200 (ENDOMECHANICALS) ×1
BANDAGE ADH SHEER 1  50/CT (GAUZE/BANDAGES/DRESSINGS) ×8 IMPLANT
BENZOIN TINCTURE PRP APPL 2/3 (GAUZE/BANDAGES/DRESSINGS) ×2 IMPLANT
CABLE HIGH FREQUENCY MONO STRZ (ELECTRODE) ×2 IMPLANT
CATH CHOLANG 76X19 KUMAR (CATHETERS) ×2 IMPLANT
CHLORAPREP W/TINT 26ML (MISCELLANEOUS) ×2 IMPLANT
CLIP APPLIE ROT 10 11.4 M/L (STAPLE) IMPLANT
CLIP VESOLOCK LG 6/CT PURPLE (CLIP) IMPLANT
CLIP VESOLOCK MED LG 6/CT (CLIP) IMPLANT
COVER MAYO STAND STRL (DRAPES) ×2 IMPLANT
COVER SURGICAL LIGHT HANDLE (MISCELLANEOUS) ×2 IMPLANT
COVER WAND RF STERILE (DRAPES) IMPLANT
DECANTER SPIKE VIAL GLASS SM (MISCELLANEOUS) ×2 IMPLANT
DERMABOND ADVANCED (GAUZE/BANDAGES/DRESSINGS)
DERMABOND ADVANCED .7 DNX12 (GAUZE/BANDAGES/DRESSINGS) IMPLANT
DRAIN CHANNEL 19F RND (DRAIN) IMPLANT
DRAPE C-ARM 42X120 X-RAY (DRAPES) IMPLANT
EVACUATOR SILICONE 100CC (DRAIN) IMPLANT
GLOVE BIOGEL PI IND STRL 7.0 (GLOVE) ×1 IMPLANT
GLOVE BIOGEL PI INDICATOR 7.0 (GLOVE) ×1
GLOVE SURG SS PI 7.0 STRL IVOR (GLOVE) ×2 IMPLANT
GOWN STRL REUS W/TWL LRG LVL3 (GOWN DISPOSABLE) ×2 IMPLANT
GOWN STRL REUS W/TWL XL LVL3 (GOWN DISPOSABLE) ×4 IMPLANT
GRASPER SUT TROCAR 14GX15 (MISCELLANEOUS) IMPLANT
KIT BASIN OR (CUSTOM PROCEDURE TRAY) ×2 IMPLANT
KIT TURNOVER KIT A (KITS) IMPLANT
POUCH RETRIEVAL ECOSAC 10 (ENDOMECHANICALS) ×1 IMPLANT
POUCH RETRIEVAL ECOSAC 10MM (ENDOMECHANICALS) ×1
SCISSORS LAP 5X35 DISP (ENDOMECHANICALS) ×2 IMPLANT
SET IRRIG TUBING LAPAROSCOPIC (IRRIGATION / IRRIGATOR) ×2 IMPLANT
SET TUBE SMOKE EVAC HIGH FLOW (TUBING) ×2 IMPLANT
SLEEVE XCEL OPT CAN 5 100 (ENDOMECHANICALS) ×4 IMPLANT
STOPCOCK 4 WAY LG BORE MALE ST (IV SETS) IMPLANT
STRIP CLOSURE SKIN 1/2X4 (GAUZE/BANDAGES/DRESSINGS) ×2 IMPLANT
SUT ETHILON 2 0 PS N (SUTURE) IMPLANT
SUT MNCRL AB 4-0 PS2 18 (SUTURE) ×2 IMPLANT
SUT VICRYL 0 ENDOLOOP (SUTURE) IMPLANT
TOWEL OR 17X26 10 PK STRL BLUE (TOWEL DISPOSABLE) ×2 IMPLANT
TOWEL OR NON WOVEN STRL DISP B (DISPOSABLE) IMPLANT
TRAY LAPAROSCOPIC (CUSTOM PROCEDURE TRAY) ×2 IMPLANT
TROCAR BLADELESS OPT 5 100 (ENDOMECHANICALS) ×2 IMPLANT
TROCAR XCEL NON-BLD 11X100MML (ENDOMECHANICALS) ×2 IMPLANT

## 2018-04-26 NOTE — Anesthesia Procedure Notes (Signed)
Procedure Name: Intubation Date/Time: 04/26/2018 10:54 AM Performed by: Talbot Grumbling, CRNA Pre-anesthesia Checklist: Patient identified, Emergency Drugs available, Suction available and Patient being monitored Patient Re-evaluated:Patient Re-evaluated prior to induction Oxygen Delivery Method: Circle system utilized Preoxygenation: Pre-oxygenation with 100% oxygen Induction Type: IV induction Ventilation: Mask ventilation without difficulty Laryngoscope Size: Mac and 3 Grade View: Grade I Tube type: Oral Tube size: 7.5 mm Number of attempts: 1 Airway Equipment and Method: Stylet Placement Confirmation: ETT inserted through vocal cords under direct vision and positive ETCO2 Secured at: 21 cm Tube secured with: Tape Dental Injury: Teeth and Oropharynx as per pre-operative assessment

## 2018-04-26 NOTE — Progress Notes (Signed)
Dr. Sheliah Hatch aware via phone that pt requested Zofran for nausea. See new order received.

## 2018-04-26 NOTE — Anesthesia Preprocedure Evaluation (Addendum)
Anesthesia Evaluation  Patient identified by MRN, date of birth, ID band Patient awake    Reviewed: Allergy & Precautions, NPO status , Patient's Chart, lab work & pertinent test results  Airway Mallampati: I  TM Distance: >3 FB Neck ROM: Full    Dental  (+) Teeth Intact, Dental Advisory Given   Pulmonary    breath sounds clear to auscultation       Cardiovascular hypertension, Pt. on home beta blockers  Rhythm:Regular Rate:Normal     Neuro/Psych  Headaches, Anxiety Depression    GI/Hepatic negative GI ROS, Neg liver ROS,   Endo/Other  Hypothyroidism   Renal/GU negative Renal ROS     Musculoskeletal negative musculoskeletal ROS (+)   Abdominal Normal abdominal exam  (+)   Peds  Hematology negative hematology ROS (+)   Anesthesia Other Findings   Reproductive/Obstetrics (+) Pregnancy                            Anesthesia Physical Anesthesia Plan  ASA: II and emergent  Anesthesia Plan: General   Post-op Pain Management:    Induction: Intravenous  PONV Risk Score and Plan: 4 or greater and Ondansetron, Dexamethasone and Treatment may vary due to age or medical condition  Airway Management Planned: Oral ETT  Additional Equipment: None  Intra-op Plan:   Post-operative Plan: Extubation in OR  Informed Consent: I have reviewed the patients History and Physical, chart, labs and discussed the procedure including the risks, benefits and alternatives for the proposed anesthesia with the patient or authorized representative who has indicated his/her understanding and acceptance.     Dental advisory given  Plan Discussed with: CRNA  Anesthesia Plan Comments:         Anesthesia Quick Evaluation

## 2018-04-26 NOTE — Op Note (Signed)
PATIENT:  Danielle [name]  33 y.o. female  PRE-OPERATIVE DIAGNOSIS:  Gallstones  POST-OPERATIVE DIAGNOSIS:  gallstones  PROCEDURE:  Procedure(s): LAPAROSCOPIC CHOLECYSTECTOMY WITH POSSIBLE INTRAOPERATIVE CHOLANGIOGRAM   SURGEON:  Surgeon(s): Kinsinger, De Blanch, MD  ASSISTANT: none  ANESTHESIA:   local and general  Indications for procedure: [name] is a 33 y.o. female with symptoms of Abdominal pain consistent with gallbladder disease, Confirmed by Ultrasound.  Description of procedure: The patient was brought into the operative suite, placed supine. Anesthesia was administered with endotracheal tube. Patient was strapped in place and foot board was secured. All pressure points were offloaded by foam padding. The patient was prepped and draped in the usual sterile fashion.  A small incision was made in the Left upper quadrant. A 20mm trocar was inserted into the peritoneal cavity with optical entry. Pneumoperitoneum was applied with high flow low pressure. 1 44mm trocar was placed in the periumbilical space. 2 27mm trocars were placed in the right lateral space. The left upper quadrant incision was upsized to 15mm trocar. Marcaine was infused to the subxiphoid space and lateral upper right abdomen in the transversus abdominis plane. Next the patient was placed in reverse trendelenberg. The gallbladder was distended and inflamed on initial inspection.  The gallbladder was retracted cephalad and lateral. The peritoneum was reflected off the infundibulum working lateral to medial. The cystic duct and cystic artery were identified and further dissection revealed a critical view, due to concern for choledocholithiasis a cholangiogram was performed with ductotomy and cook catheter passed through a separate subcostal stab incision. Normal ductal anatomy was seen without filling defects. The cystic duct and cystic artery were doubly clipped and ligated.   The gallbladder was removed off the  liver bed with cautery. The Gallbladder was placed in a specimen bag. The gallbladder fossa was irrigated and hemostasis was applied with cautery. The gallbladder was removed via the 51mm trocar. The fascial defect was closed with interrupted 0 vicryl suture via laparoscopic trans-fascial suture passer. Pneumoperitoneum was removed, all trocar were removed. All incisions were closed with 4-0 monocryl subcuticular stitch. The patient woke from anesthesia and was brought to PACU in stable condition. All counts were correct  Findings: inflamed gallbladder, no choledocholithiasis  Specimen: gallbladder  Blood loss: <30 ml  Local anesthesia: 30 ml marcaine  Complications: none  PLAN OF CARE: Admit to inpatient   PATIENT DISPOSITION:  PACU - hemodynamically stable.  Feliciana Rossetti, M.D. General, Bariatric, & Minimally Invasive Surgery [facility]

## 2018-04-26 NOTE — Progress Notes (Signed)
Pre Procedure note for inpatients:   Danielle [name] has been scheduled for Procedure(s): LAPAROSCOPIC CHOLECYSTECTOMY WITH POSSIBLE INTRAOPERATIVE CHOLANGIOGRAM (N/A) today. The various methods of treatment have been discussed with the patient. After consideration of the risks, benefits and treatment options the patient has consented to the planned procedure.   The patient has been seen and labs reviewed. There are no changes in the patient's condition to prevent proceeding with the planned procedure today.  Recent labs:  Lab Results  Component Value Date   WBC 5.1 04/25/2018   HGB 8.7 (L) 04/25/2018   HCT 29.8 (L) 04/25/2018   PLT 152 04/25/2018   GLUCOSE 81 04/26/2018   ALT 35 04/26/2018   AST 28 04/26/2018   NA 135 04/26/2018   K 3.7 04/26/2018   CL 106 04/26/2018   CREATININE 0.39 (L) 04/26/2018   BUN <5 (L) 04/26/2018   CO2 21 (L) 04/26/2018   TSH 2.540 10/08/2017   HGBA1C 5.4 10/08/2017    Rodman Pickle, MD 04/26/2018 9:03 AM

## 2018-04-26 NOTE — Transfer of Care (Signed)
Immediate Anesthesia Transfer of Care Note  Patient: Danielle [name]  Procedure(s) Performed: LAPAROSCOPIC CHOLECYSTECTOMY WITH POSSIBLE INTRAOPERATIVE CHOLANGIOGRAM (N/A Abdomen)  Patient Location: PACU  Anesthesia Type:General  Level of Consciousness: sedated  Airway & Oxygen Therapy: Patient Spontanous Breathing and Patient connected to face mask oxygen  Post-op Assessment: Report given to RN and Post -op Vital signs reviewed and stable  Post vital signs: Reviewed and stable  Last Vitals:  Vitals Value Taken Time  BP    Temp    Pulse    Resp    SpO2      Last Pain:  Vitals:   04/26/18 0922  TempSrc: Oral  PainSc:       Patients Stated Pain Goal: 3 (04/26/18 0800)  Complications: No apparent anesthesia complications

## 2018-04-26 NOTE — Anesthesia Postprocedure Evaluation (Signed)
Anesthesia Post Note  Patient: Danielle [name]  Procedure(s) Performed: LAPAROSCOPIC CHOLECYSTECTOMY WITH POSSIBLE INTRAOPERATIVE CHOLANGIOGRAM (N/A Abdomen)     Patient location during evaluation: PACU Anesthesia Type: General Level of consciousness: awake and alert Pain management: pain level controlled Vital Signs Assessment: post-procedure vital signs reviewed and stable Respiratory status: spontaneous breathing, nonlabored ventilation, respiratory function stable and patient connected to nasal cannula oxygen Cardiovascular status: blood pressure returned to baseline and stable Postop Assessment: no apparent nausea or vomiting Anesthetic complications: no    Last Vitals:  Vitals:   04/26/18 1230 04/26/18 1240  BP: 134/77 (!) 144/83  Pulse: 66 73  Resp: 17 18  Temp:  36.7 C  SpO2: 97% 98%    Last Pain:  Vitals:   04/26/18 1240  TempSrc:   PainSc: 0-No pain                 Shelton Silvas

## 2018-04-27 LAB — CBC
HCT: 29 % — ABNORMAL LOW (ref 36.0–46.0)
Hemoglobin: 8.7 g/dL — ABNORMAL LOW (ref 12.0–15.0)
MCH: 21.5 pg — ABNORMAL LOW (ref 26.0–34.0)
MCHC: 30 g/dL (ref 30.0–36.0)
MCV: 71.6 fL — ABNORMAL LOW (ref 80.0–100.0)
Platelets: 155 10*3/uL (ref 150–400)
RBC: 4.05 MIL/uL (ref 3.87–5.11)
RDW: 19.9 % — ABNORMAL HIGH (ref 11.5–15.5)
WBC: 10.2 10*3/uL (ref 4.0–10.5)
nRBC: 0 % (ref 0.0–0.2)

## 2018-04-27 LAB — COMPREHENSIVE METABOLIC PANEL
ALT: 35 U/L (ref 0–44)
AST: 34 U/L (ref 15–41)
Albumin: 2.8 g/dL — ABNORMAL LOW (ref 3.5–5.0)
Alkaline Phosphatase: 77 U/L (ref 38–126)
Anion gap: 9 (ref 5–15)
BUN: 5 mg/dL — ABNORMAL LOW (ref 6–20)
CALCIUM: 8.5 mg/dL — AB (ref 8.9–10.3)
CO2: 20 mmol/L — ABNORMAL LOW (ref 22–32)
Chloride: 106 mmol/L (ref 98–111)
Creatinine, Ser: 0.49 mg/dL (ref 0.44–1.00)
GFR calc non Af Amer: >60 mL/min (ref >60)
Glucose, Bld: 116 mg/dL — ABNORMAL HIGH (ref 70–99)
Potassium: 3.4 mmol/L — ABNORMAL LOW (ref 3.5–5.1)
Sodium: 135 mmol/L (ref 135–145)
TOTAL PROTEIN: 6 g/dL — AB (ref 6.5–8.1)
Total Bilirubin: 0.7 mg/dL (ref 0.3–1.2)

## 2018-04-27 MED ORDER — OXYCODONE HCL 5 MG PO TABS: 5.0000 mg | ORAL_TABLET | Freq: Four times a day (QID) | ORAL | 0 refills | Status: DC | PRN

## 2018-04-27 NOTE — Plan of Care (Signed)
  Problem: Health Behavior/Discharge Planning: Goal: Ability to manage health-related needs will improve Outcome: Adequate for Discharge   Problem: Education: Goal: Knowledge of General Education information will improve Description Including pain rating scale, medication(s)/side effects and non-pharmacologic comfort measures Outcome: Adequate for Discharge   Problem: Health Behavior/Discharge Planning: Goal: Ability to manage health-related needs will improve Outcome: Adequate for Discharge   Problem: Clinical Measurements: Goal: Respiratory complications will improve Outcome: Adequate for Discharge Goal: Cardiovascular complication will be avoided Outcome: Adequate for Discharge   Problem: Activity: Goal: Risk for activity intolerance will decrease Outcome: Adequate for Discharge   Problem: Nutrition: Goal: Adequate nutrition will be maintained Outcome: Adequate for Discharge   Problem: Coping: Goal: Level of anxiety will decrease Outcome: Adequate for Discharge   Problem: Elimination: Goal: Will not experience complications related to bowel motility Outcome: Adequate for Discharge Goal: Will not experience complications related to urinary retention Outcome: Adequate for Discharge   Problem: Pain Managment: Goal: General experience of comfort will improve Outcome: Adequate for Discharge   Problem: Safety: Goal: Ability to remain free from injury will improve Outcome: Adequate for Discharge   Problem: Skin Integrity: Goal: Risk for impaired skin integrity will decrease Outcome: Adequate for Discharge   Problem: Clinical Measurements: Goal: Diagnostic test results will improve Outcome: Completed/Met   Problem: Clinical Measurements: Goal: Ability to maintain clinical measurements within normal limits will improve Outcome: Completed/Met Goal: Will remain free from infection Outcome: Completed/Met Goal: Diagnostic test results will improve Outcome:  Completed/Met

## 2018-04-27 NOTE — Progress Notes (Signed)
Discharge instructions given.  Education given on post op care and current medications.  Voiced understanding.  All questions answered.  IV removed intact.  Ambulated with staff to exit and released to family.

## 2018-04-27 NOTE — Discharge Summary (Signed)
Physician Discharge Summary  Danielle [name] JXB:[id number] DOB: [birth year] DOA: 04/24/2018  PCP: [facility]  Admit date: 04/24/2018 Discharge date: 04/27/2018  Recommendations for Outpatient Follow-up:  1.  (include homehealth, outpatient follow-up instructions, specific recommendations for PCP to follow-up on, etc.)  Follow-up Information    [facility] Follow up on 05/13/2018.   Specialty:  General Surgery Why:  Your follow up will be a phone call appointment  (due to the [facility] virus, to limit exposure.) Hedda Slade will call you at 9:45 AM.  Please email a picture if you have an concerns with you wound to : [email or web address]   Contact information: [address] [place] [address] [phone]          Discharge Diagnoses:  Active Problems:   Acute cholecystitis   Surgical Procedure: lap chole ioc  Discharge Condition: Good Disposition: Home  Diet recommendation: low fat diet   Hospital Course:  33 yo female presented to ER with abdominal pain and had acute cholecystitis with concern of choledocholithiasis. She underwent lap chole w ioc showing now stones. She did well post op and was discharged home POD 1.  Discharge Instructions  Discharge Instructions    Call MD for:  difficulty breathing, headache or visual disturbances   Complete by:  As directed    Call MD for:  hives   Complete by:  As directed    Call MD for:  persistant nausea and vomiting   Complete by:  As directed    Call MD for:  redness, tenderness, or signs of infection (pain, swelling, redness, odor or green/yellow discharge around incision site)   Complete by:  As directed    Call MD for:  severe uncontrolled pain   Complete by:  As directed    Call MD for:  temperature >100.4   Complete by:  As directed    Diet - low sodium heart healthy   Complete by:  As directed    Discharge wound care:   Complete by:  As directed    Ok to shower  tomorrow. Glue will likely peel off in 1-3 weeks. No bandage required   Driving Restrictions   Complete by:  As directed    No driving while on narcotics   Increase activity slowly   Complete by:  As directed    Lifting restrictions   Complete by:  As directed    No lifting greater than 20 pounds for 3 weeks     Allergies as of 04/27/2018      Reactions   Latex Hives, Rash      Medication List    TAKE these medications   ARIPiprazole 10 MG tablet Commonly known as:  ABILIFY Take 1 tablet (10 mg total) by mouth daily.   aspirin 81 MG chewable tablet Chew 1 tablet (81 mg total) by mouth daily.   benztropine 1 MG tablet Commonly known as:  COGENTIN Take 1 mg by mouth at bedtime.   gabapentin 300 MG capsule Commonly known as:  NEURONTIN Take 300 mg by mouth daily as needed (anxiety).   labetalol 100 MG tablet Commonly known as:  NORMODYNE Take 1 tablet (100 mg total) by mouth 2 (two) times daily.   oxyCODONE 5 MG immediate release tablet Commonly known as:  Oxy IR/ROXICODONE Take 1 tablet (5 mg total) by mouth every 6 (six) hours as needed for severe pain.   terconazole 0.8 % vaginal cream Commonly known as:  Terazol 3  Place 1 applicator vaginally at bedtime.            Discharge Care Instructions  (From admission, onward)         Start     Ordered   04/27/18 0000  Discharge wound care:    Comments:  Ok to shower tomorrow. Glue will likely peel off in 1-3 weeks. No bandage required   04/27/18 0901         Follow-up Information    [facility] Follow up on 05/13/2018.   Specialty:  General Surgery Why:  Your follow up will be a phone call appointment  (due to the [facility] virus, to limit exposure.) Hedda Slade will call you at 9:45 AM.  Please email a picture if you have an concerns with you wound to : [email or web address] @ [email or web address]   Contact information: [address] [place] [address] [phone]            The  results of significant diagnostics from this hospitalization (including imaging, microbiology, ancillary and laboratory) are listed below for reference.    Significant Diagnostic Studies: Dg Cholangiogram Operative  Result Date: 04/26/2018 CLINICAL DATA:  Gallstones.  Intraoperative cholangiogram. EXAM: INTRAOPERATIVE CHOLANGIOGRAM TECHNIQUE: Cholangiographic images from the C-arm fluoroscopic device were submitted for interpretation post-operatively. Please see the procedural report for the amount of contrast and the fluoroscopy time utilized. COMPARISON:  Ultrasound 04/24/2018 FINDINGS: Contrast opacification of the extrahepatic biliary system. Minor filling of the intrahepatic biliary system. There is contrast draining into the duodenum. No large filling defects or stones. IMPRESSION: Patent biliary system.  No large filling defects or stones. Electronically Signed   By: Richarda Overlie M.D.   On: 04/26/2018 13:52   US Abdomen Limited Ruq  Result Date: 04/24/2018 CLINICAL DATA:  Right upper quadrant pain EXAM: ULTRASOUND ABDOMEN LIMITED RIGHT UPPER QUADRANT COMPARISON:  Ultrasound abdomen 01/04/2018 FINDINGS: Gallbladder: Gallbladder is better distended on the current study compared to the prior study. Numerous gallstones measuring up to 9 mm. Gallbladder wall non thickened. Mild tenderness over the gallbladder during scanning. Common bile duct: Diameter: 7.0 mm, upper normal Liver: No focal lesion identified. Within normal limits in parenchymal echogenicity. Portal vein is patent on color Doppler imaging with normal direction of blood flow towards the liver. IMPRESSION: Cholelithiasis. No gallbladder wall thickening but mild tenderness over the gallbladder. Common bile duct 7 mm Electronically Signed   By: Marlan Palau M.D.   On: 04/24/2018 18:30    Labs: Basic Metabolic Panel: Recent Labs  Lab 04/24/18 1701 04/25/18 0534 04/26/18 0554 04/27/18 0559  NA 139 136 135 135  K 3.6 3.7 3.7 3.4*  CL  109 108 106 106  CO2 23 21* 21* 20*  GLUCOSE 115* 79 81 116*  BUN 7 7 <5* 5*  CREATININE 0.51 0.40* 0.39* 0.49  CALCIUM 9.0 8.2* 8.5* 8.5*   Liver Function Tests: Recent Labs  Lab 04/24/18 1701 04/25/18 0534 04/26/18 0554 04/27/18 0559  AST 90* 49* 28 34  ALT 65* 44 35 35  ALKPHOS 92 76 73 77  BILITOT 1.6* 1.9* 2.7* 0.7  PROT 7.1 5.8* 5.9* 6.0*  ALBUMIN 3.4* 2.8* 2.9* 2.8*    CBC: Recent Labs  Lab 04/24/18 1701 04/25/18 0534 04/26/18 0554 04/27/18 0559  WBC 5.9 5.1 4.4 10.2  NEUTROABS  --  2.5  --   --   HGB 10.4* 8.7* 8.7* 8.7*  HCT 35.3* 29.8* 30.0* 29.0*  MCV 72.6* 72.2* 73.0* 71.6*  PLT 179  152 154 155    CBG: No results for input(s): GLUCAP in the last 168 hours.  Active Problems:   Acute cholecystitis   Time coordinating discharge: 

## 2018-04-28 ENCOUNTER — Encounter (HOSPITAL_COMMUNITY): Payer: Self-pay | Admitting: General Surgery

## 2018-05-07 ENCOUNTER — Ambulatory Visit (INDEPENDENT_AMBULATORY_CARE_PROVIDER_SITE_OTHER): Payer: Medicaid Other | Admitting: Obstetrics

## 2018-05-07 ENCOUNTER — Encounter: Payer: Self-pay | Admitting: Obstetrics

## 2018-05-07 ENCOUNTER — Other Ambulatory Visit: Payer: Self-pay

## 2018-05-07 VITALS — BP 135/79 | HR 111 | Wt 236.2 lb

## 2018-05-07 DIAGNOSIS — Z3A20 20 weeks gestation of pregnancy: Secondary | ICD-10-CM

## 2018-05-07 DIAGNOSIS — Z9049 Acquired absence of other specified parts of digestive tract: Secondary | ICD-10-CM

## 2018-05-07 DIAGNOSIS — G44001 Cluster headache syndrome, unspecified, intractable: Secondary | ICD-10-CM

## 2018-05-07 DIAGNOSIS — O0990 Supervision of high risk pregnancy, unspecified, unspecified trimester: Secondary | ICD-10-CM | POA: Insufficient documentation

## 2018-05-07 DIAGNOSIS — Z3481 Encounter for supervision of other normal pregnancy, first trimester: Secondary | ICD-10-CM | POA: Diagnosis not present

## 2018-05-07 DIAGNOSIS — O10912 Unspecified pre-existing hypertension complicating pregnancy, second trimester: Secondary | ICD-10-CM

## 2018-05-07 DIAGNOSIS — Z8759 Personal history of other complications of pregnancy, childbirth and the puerperium: Secondary | ICD-10-CM

## 2018-05-07 DIAGNOSIS — O10911 Unspecified pre-existing hypertension complicating pregnancy, first trimester: Secondary | ICD-10-CM

## 2018-05-07 DIAGNOSIS — Z8719 Personal history of other diseases of the digestive system: Secondary | ICD-10-CM

## 2018-05-07 DIAGNOSIS — O0992 Supervision of high risk pregnancy, unspecified, second trimester: Secondary | ICD-10-CM

## 2018-05-07 MED ORDER — BUTALBITAL-APAP-CAFFEINE 50-325-40 MG PO TABS: 2.0000 | ORAL_TABLET | Freq: Four times a day (QID) | ORAL | 2 refills | Status: DC | PRN
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
## 2018-05-07 NOTE — Progress Notes (Signed)
Pt presents for NOB c/o HA's no relief with Tylenol per pt. This is not a planned pregnancy and FOB is not involved.

## 2018-05-09 LAB — CULTURE, OB URINE

## 2018-05-09 LAB — URINE CULTURE, OB REFLEX

## 2018-05-12 ENCOUNTER — Encounter: Payer: Self-pay | Admitting: Obstetrics

## 2018-05-15 ENCOUNTER — Telehealth: Payer: Self-pay

## 2018-05-15 NOTE — Telephone Encounter (Signed)
Recvd call from [facility] this morning that this patients BP last night was 141/85, retake was 127/79. Called pt and left message stating that the retake BP is good, as long as she is not having any symptoms such as headache or blurry vision or any preterm labor symptoms, and as long as baby is moving well this BP is of no concern. Instructed pt to call back with any further questions or symptoms.

## 2018-05-20 ENCOUNTER — Encounter: Payer: Self-pay | Admitting: Obstetrics

## 2018-05-21 ENCOUNTER — Other Ambulatory Visit: Payer: Self-pay | Admitting: Obstetrics

## 2018-05-21 ENCOUNTER — Other Ambulatory Visit: Payer: Self-pay

## 2018-05-21 ENCOUNTER — Ambulatory Visit (HOSPITAL_COMMUNITY)
Admission: RE | Admit: 2018-05-21 | Discharge: 2018-05-21 | Disposition: A | Discharge status: Home / Self Care | Payer: Medicaid Other | Source: Ambulatory Visit | Attending: Obstetrics | Admitting: Obstetrics

## 2018-05-21 DIAGNOSIS — O99282 Endocrine, nutritional and metabolic diseases complicating pregnancy, second trimester: Secondary | ICD-10-CM

## 2018-05-21 DIAGNOSIS — O99342 Other mental disorders complicating pregnancy, second trimester: Secondary | ICD-10-CM

## 2018-05-21 DIAGNOSIS — O0990 Supervision of high risk pregnancy, unspecified, unspecified trimester: Secondary | ICD-10-CM

## 2018-05-21 DIAGNOSIS — E039 Hypothyroidism, unspecified: Secondary | ICD-10-CM

## 2018-05-21 DIAGNOSIS — O09292 Supervision of pregnancy with other poor reproductive or obstetric history, second trimester: Secondary | ICD-10-CM

## 2018-05-21 DIAGNOSIS — O10012 Pre-existing essential hypertension complicating pregnancy, second trimester: Secondary | ICD-10-CM | POA: Diagnosis not present

## 2018-05-21 DIAGNOSIS — Z363 Encounter for antenatal screening for malformations: Secondary | ICD-10-CM

## 2018-05-21 DIAGNOSIS — Z3A22 22 weeks gestation of pregnancy: Secondary | ICD-10-CM

## 2018-05-21 DIAGNOSIS — O0932 Supervision of pregnancy with insufficient antenatal care, second trimester: Secondary | ICD-10-CM

## 2018-05-22 ENCOUNTER — Other Ambulatory Visit (HOSPITAL_COMMUNITY): Payer: Self-pay | Admitting: *Deleted

## 2018-05-22 DIAGNOSIS — Z362 Encounter for other antenatal screening follow-up: Secondary | ICD-10-CM

## 2018-05-28 ENCOUNTER — Telehealth: Payer: Self-pay | Admitting: *Deleted

## 2018-05-28 NOTE — Telephone Encounter (Signed)
Pt had abnormal [place] screening.  Danielle [name] has attempted to contact pt but can't reach her.  Attempt to contact pt today. No answer, LM on VM to call foe results.

## 2018-06-04 ENCOUNTER — Other Ambulatory Visit: Payer: Self-pay

## 2018-06-04 ENCOUNTER — Ambulatory Visit (INDEPENDENT_AMBULATORY_CARE_PROVIDER_SITE_OTHER): Payer: Medicaid Other | Admitting: Obstetrics & Gynecology

## 2018-06-04 ENCOUNTER — Encounter: Payer: Self-pay | Admitting: Obstetrics

## 2018-06-04 VITALS — BP 135/63 | HR 89

## 2018-06-04 DIAGNOSIS — O0990 Supervision of high risk pregnancy, unspecified, unspecified trimester: Secondary | ICD-10-CM

## 2018-06-04 DIAGNOSIS — O0992 Supervision of high risk pregnancy, unspecified, second trimester: Secondary | ICD-10-CM

## 2018-06-04 DIAGNOSIS — D563 Thalassemia minor: Secondary | ICD-10-CM

## 2018-06-04 DIAGNOSIS — Z3A24 24 weeks gestation of pregnancy: Secondary | ICD-10-CM

## 2018-06-04 NOTE — Patient Instructions (Signed)
Thalassemia    Thalassemia is a blood disorder that causes a low level of red blood cells (anemia). This condition is passed from parent to child through abnormal genes (gene mutations). The mutations make it hard for your body to make the protein in red blood cells (hemoglobin) that carries oxygen from your lungs to the rest of your body. Red blood cells do not live long without hemoglobin. Loss of red blood cells leads to anemia, which is the main symptom of thalassemia.  There are two main types of thalassemia. The type depends on which part of the hemoglobin is affected.  · Alpha thalassemia affects the alpha part of the hemoglobin. This is caused by four genes. You could get two from each parent.  · Beta thalassemia affects the beta part of the hemoglobin. This is caused by two genes. You could get one from each parent.  Thalassemia can be mild or severe. It depends on how many gene mutations you are born with. The more gene mutations you get, the more severe the condition. A person who inherits just one gene will be a carrier of the condition (thalassemia trait). A person with thalassemia trait may not have any symptoms or may have only mild anemia. A person who inherits two or more genes can have thalassemia minor, thalassemia intermedia, or thalassemia major.  Thalassemia is a lifelong condition. There is no cure, but treatment can control symptoms and manage the condition.  What are the causes?  Thalassemia is cause by gene mutations that are passed down through families.  What increases the risk?  You are more likely to develop this condition if:  · You have a family history of thalassemia.   · Your ancestors are from [place], [place], [place], [place], [place], or the [place].  What are the signs or symptoms?  The most common signs and symptoms of thalassemia are the signs and symptoms of anemia. They include:  · Weakness.  · Tiredness.  · Pounding heartbeat.  · Dizziness.  · Headache.  · Leg  cramps.  · Pale skin.  · Confusion.  · Shortness of breath.  Other signs and symptoms can also occur. You may have:  · Yellow eyes or skin, and dark urine (jaundice). The breakdown of red blood cells can cause a yellowing pigment (bilirubin) to build up in your blood.  · Weak bones (osteoporosis) and bone fractures. This is because bones can weaken from the effort of making more hemoglobin.  · An enlarged spleen. This can lead to a swollen belly. Your spleen can become enlarged from filtering dead red blood cells.  · Frequent, severe infections. This occurs if your spleen and bone marrow become weak. These organs make white blood cells that your body needs to fight infections.  How is this diagnosed?  Your health care provider may suspect thalassemia based on your signs and symptoms, especially if you have a family history of the condition. This condition may be diagnosed:  · In childhood, if you have severe forms of thalassemia. This is because symptoms show early in life.  · At birth. In the [place] babies are screened for this condition.  · In adulthood, if you have thalassemia trait or thalassemia minor. This happens if symptoms of anemia start or if a routine blood test shows unexplained anemia.  Blood tests can confirm a thalassemia diagnosis. Blood tests may show:  · Low hemoglobin.  · Low iron.  · Abnormal hemoglobin.  · Thalassemia gene mutations.  You   may need to see a health care provider who specializes in blood diseases (hematologist).  How is this treated?  Treatment for this condition depends on the type of thalassemia that you have:  · If you have thalassemia trait or thalassemia minor, you may not need treatment. However, you may need treatment if you have thalassemia minor and you develop symptoms during an infection.  · If you have thalassemia intermedia, you will have symptoms that require treatment.  · If you have major thalassemia, you will have serious symptoms that require regular  treatment.  Thalassemia treatment may include:  · Donated blood (transfusions) to replace red blood cells.  · Vitamin B (folic acid) supplements to help produce hemoglobin and red blood cells.  · Medicines or injections to remove iron buildup (chelation). This can happen in people who have frequent transfusions. Iron overload can damage heart, liver, and brain cells.  · In the case of severe thalassemia:  ? The spleen may need to be removed if it becomes damaged.  ? Stem cell or bone marrow transplants may be necessary to transplant cells that can make red blood cells. This may be done if transfusions are not working.  Follow these instructions at home:  Eating and drinking    · Follow instructions from your health care provider about eating or drinking restrictions. You may need to avoid foods or drinks that are high in iron or fortified with iron.  · Eat foods that are high in fiber, such as fresh fruits and vegetables, whole grains, and beans. Limit foods that are high in fat and processed sugars, such as fried and sweet foods. Nutrition is important for preventing anemia.  Activity  · Return to your normal activities as told by your health care provider. Ask your health care provider what activities are safe for you.  · Exercise is important for maintaining energy and strong bones. Ask your health care provider what amount and type of exercise is safe for you.  General instructions    · Take over-the-counter and prescription medicines only as told by your health care provider.  · Keep all routine vaccinations and flu shots up to date to reduce your risk of infection.  · Wash your hands frequently.  · Do your best to avoid sick people, and stay out of crowds during cold and flu seasons.  · Meet with a genetic counselor if you are or may become pregnant. A genetic counselor can explain the risks of passing thalassemia to a child.  · Keep all follow-up visits as told by your health care provider. This is  important.  Contact a health care provider if:  · You have signs or symptoms of anemia.  · You have a fever or other signs of infection.  · Your belly is swollen.  · You have jaundice.  Get help right away if:  · You feel very weak or short of breath.  Summary  · Thalassemia is a blood disorder that causes anemia.  · Thalassemia can range from mild to severe.  · This condition is passed down through families.  · There is no cure, but treatment can manage the symptoms and prevent anemia.  This information is not intended to replace advice given to you by your health care provider. Make sure you discuss any questions you have with your health care provider.  Document Released: 05/21/2017 Document Revised: 05/21/2017 Document Reviewed: 05/21/2017  Elsevier Interactive Patient Education © 2019 [employer]vier Inc[employer]

## 2018-06-04 NOTE — Progress Notes (Signed)
Pt is on the phone preparing for Webex visit with provider. [redacted]w[redacted]d. Pt has babys[employer]s BP cuff at home and is able to check BP while on the phone.
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
## 2018-06-12 ENCOUNTER — Other Ambulatory Visit: Payer: Self-pay | Admitting: Obstetrics & Gynecology

## 2018-06-12 DIAGNOSIS — B009 Herpesviral infection, unspecified: Secondary | ICD-10-CM

## 2018-06-12 MED ORDER — VALACYCLOVIR HCL 1 G PO TABS: 1000.0000 mg | ORAL_TABLET | Freq: Two times a day (BID) | ORAL | 0 refills | Status: DC

## 2018-06-12 NOTE — Progress Notes (Signed)
Valtrex for HSV sx

## 2018-06-18 ENCOUNTER — Ambulatory Visit (HOSPITAL_COMMUNITY): Payer: Medicaid Other | Admitting: *Deleted

## 2018-06-18 ENCOUNTER — Ambulatory Visit (HOSPITAL_COMMUNITY)
Admission: RE | Admit: 2018-06-18 | Discharge: 2018-06-18 | Disposition: A | Discharge status: Home / Self Care | Payer: Medicaid Other | Source: Ambulatory Visit | Attending: Obstetrics and Gynecology | Admitting: Obstetrics and Gynecology

## 2018-06-18 ENCOUNTER — Encounter (HOSPITAL_COMMUNITY): Payer: Self-pay | Admitting: *Deleted

## 2018-06-18 ENCOUNTER — Other Ambulatory Visit: Payer: Self-pay

## 2018-06-18 VITALS — BP 124/66 | HR 98 | Temp 98.8°F

## 2018-06-18 DIAGNOSIS — O99282 Endocrine, nutritional and metabolic diseases complicating pregnancy, second trimester: Secondary | ICD-10-CM

## 2018-06-18 DIAGNOSIS — O09292 Supervision of pregnancy with other poor reproductive or obstetric history, second trimester: Secondary | ICD-10-CM | POA: Diagnosis not present

## 2018-06-18 DIAGNOSIS — O99212 Obesity complicating pregnancy, second trimester: Secondary | ICD-10-CM

## 2018-06-18 DIAGNOSIS — Z362 Encounter for other antenatal screening follow-up: Secondary | ICD-10-CM | POA: Insufficient documentation

## 2018-06-18 DIAGNOSIS — E039 Hypothyroidism, unspecified: Secondary | ICD-10-CM

## 2018-06-18 DIAGNOSIS — O99342 Other mental disorders complicating pregnancy, second trimester: Secondary | ICD-10-CM

## 2018-06-18 DIAGNOSIS — I10 Essential (primary) hypertension: Secondary | ICD-10-CM

## 2018-06-18 DIAGNOSIS — O10012 Pre-existing essential hypertension complicating pregnancy, second trimester: Secondary | ICD-10-CM | POA: Diagnosis not present

## 2018-06-18 DIAGNOSIS — Z3A26 26 weeks gestation of pregnancy: Secondary | ICD-10-CM

## 2018-06-18 DIAGNOSIS — K831 Obstruction of bile duct: Secondary | ICD-10-CM

## 2018-06-18 DIAGNOSIS — O26612 Liver and biliary tract disorders in pregnancy, second trimester: Secondary | ICD-10-CM

## 2018-06-18 DIAGNOSIS — O0930 Supervision of pregnancy with insufficient antenatal care, unspecified trimester: Secondary | ICD-10-CM

## 2018-06-19 ENCOUNTER — Other Ambulatory Visit (HOSPITAL_COMMUNITY): Payer: Self-pay | Admitting: *Deleted

## 2018-06-19 DIAGNOSIS — O10913 Unspecified pre-existing hypertension complicating pregnancy, third trimester: Secondary | ICD-10-CM

## 2018-07-01 ENCOUNTER — Telehealth: Payer: Self-pay | Admitting: *Deleted

## 2018-07-01 NOTE — Telephone Encounter (Signed)
Pt called to office to ask if she should be fasting for upcoming appt.  Return call to pt.   LM on VM making her aware that she should be fasting as she is scheduled for her glucose testing. Pt advised that she may have plain water if needed but nothing else to eat/drink.

## 2018-07-04 ENCOUNTER — Encounter: Payer: Self-pay | Admitting: Obstetrics and Gynecology

## 2018-07-04 ENCOUNTER — Other Ambulatory Visit: Payer: Self-pay

## 2018-07-04 ENCOUNTER — Other Ambulatory Visit: Payer: Medicaid Other

## 2018-07-04 ENCOUNTER — Ambulatory Visit (INDEPENDENT_AMBULATORY_CARE_PROVIDER_SITE_OTHER): Payer: Medicaid Other | Admitting: Obstetrics and Gynecology

## 2018-07-04 VITALS — BP 133/83 | HR 103 | Wt 242.8 lb

## 2018-07-04 DIAGNOSIS — O99283 Endocrine, nutritional and metabolic diseases complicating pregnancy, third trimester: Secondary | ICD-10-CM

## 2018-07-04 DIAGNOSIS — Z23 Encounter for immunization: Secondary | ICD-10-CM | POA: Diagnosis not present

## 2018-07-04 DIAGNOSIS — O10913 Unspecified pre-existing hypertension complicating pregnancy, third trimester: Secondary | ICD-10-CM

## 2018-07-04 DIAGNOSIS — E039 Hypothyroidism, unspecified: Secondary | ICD-10-CM

## 2018-07-04 DIAGNOSIS — Z3A28 28 weeks gestation of pregnancy: Secondary | ICD-10-CM

## 2018-07-04 DIAGNOSIS — O10919 Unspecified pre-existing hypertension complicating pregnancy, unspecified trimester: Secondary | ICD-10-CM

## 2018-07-04 DIAGNOSIS — L299 Pruritus, unspecified: Secondary | ICD-10-CM

## 2018-07-04 DIAGNOSIS — O0993 Supervision of high risk pregnancy, unspecified, third trimester: Secondary | ICD-10-CM

## 2018-07-04 DIAGNOSIS — O0990 Supervision of high risk pregnancy, unspecified, unspecified trimester: Secondary | ICD-10-CM

## 2018-07-04 DIAGNOSIS — K810 Acute cholecystitis: Secondary | ICD-10-CM

## 2018-07-04 DIAGNOSIS — O09293 Supervision of pregnancy with other poor reproductive or obstetric history, third trimester: Secondary | ICD-10-CM

## 2018-07-04 DIAGNOSIS — Z8759 Personal history of other complications of pregnancy, childbirth and the puerperium: Secondary | ICD-10-CM

## 2018-07-04 DIAGNOSIS — D563 Thalassemia minor: Secondary | ICD-10-CM

## 2018-07-04 NOTE — Progress Notes (Signed)
Pt presents for ROB. Pt complains of having itchy hands and feet.
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
## 2018-07-05 LAB — CBC
Hematocrit: 31.8 % — ABNORMAL LOW (ref 34.0–46.6)
Hemoglobin: 9.5 g/dL — ABNORMAL LOW (ref 11.1–15.9)
MCH: 20.7 pg — ABNORMAL LOW (ref 26.6–33.0)
MCHC: 29.9 g/dL — ABNORMAL LOW (ref 31.5–35.7)
MCV: 69 fL — ABNORMAL LOW (ref 79–97)
Platelets: 494 10*3/uL — ABNORMAL HIGH (ref 150–450)
RBC: 4.58 x10E6/uL (ref 3.77–5.28)
RDW: 18.5 % — ABNORMAL HIGH (ref 11.7–15.4)
WBC: 6.3 10*3/uL (ref 3.4–10.8)

## 2018-07-05 LAB — GLUCOSE TOLERANCE, 2 HOURS W/ 1HR
Glucose, 1 hour: 137 mg/dL (ref 65–179)
Glucose, 2 hour: 113 mg/dL (ref 65–152)
Glucose, Fasting: 72 mg/dL (ref 65–91)

## 2018-07-05 LAB — RPR: RPR Ser Ql: NONREACTIVE

## 2018-07-05 LAB — HIV ANTIBODY (ROUTINE TESTING W REFLEX): HIV Screen 4th Generation wRfx: NONREACTIVE

## 2018-07-08 LAB — TSH: TSH: 1.34 u[IU]/mL (ref 0.450–4.500)

## 2018-07-08 LAB — COMPREHENSIVE METABOLIC PANEL
ALT: 5 IU/L (ref 0–32)
AST: 9 IU/L (ref 0–40)
Albumin/Globulin Ratio: 1.2 (ref 1.2–2.2)
Albumin: 3.4 g/dL — ABNORMAL LOW (ref 3.8–4.8)
Alkaline Phosphatase: 62 IU/L (ref 39–117)
BUN/Creatinine Ratio: 8 — ABNORMAL LOW (ref 9–23)
BUN: 4 mg/dL — ABNORMAL LOW (ref 6–20)
Bilirubin Total: 0.3 mg/dL (ref 0.0–1.2)
CO2: 19 mmol/L — ABNORMAL LOW (ref 20–29)
Calcium: 8.5 mg/dL — ABNORMAL LOW (ref 8.7–10.2)
Chloride: 102 mmol/L (ref 96–106)
Creatinine, Ser: 0.5 mg/dL — ABNORMAL LOW (ref 0.57–1.00)
GFR calc Af Amer: 146 mL/min/{1.73_m2} (ref >59)
GFR calc non Af Amer: 127 mL/min/{1.73_m2} (ref >59)
Globulin, Total: 2.8 g/dL (ref 1.5–4.5)
Glucose: 144 mg/dL — ABNORMAL HIGH (ref 65–99)
Potassium: 3.8 mmol/L (ref 3.5–5.2)
Sodium: 136 mmol/L (ref 134–144)
Total Protein: 6.2 g/dL (ref 6.0–8.5)

## 2018-07-08 LAB — BILE ACIDS, TOTAL: Bile Acids Total: 5 umol/L (ref 0.0–10.0)

## 2018-07-16 ENCOUNTER — Other Ambulatory Visit: Payer: Self-pay

## 2018-07-16 ENCOUNTER — Ambulatory Visit (HOSPITAL_COMMUNITY)
Admission: RE | Admit: 2018-07-16 | Discharge: 2018-07-16 | Disposition: A | Discharge status: Home / Self Care | Payer: Medicaid Other | Source: Ambulatory Visit | Attending: Obstetrics and Gynecology | Admitting: Obstetrics and Gynecology

## 2018-07-16 ENCOUNTER — Encounter (HOSPITAL_COMMUNITY): Payer: Self-pay

## 2018-07-16 ENCOUNTER — Ambulatory Visit (HOSPITAL_COMMUNITY): Payer: Medicaid Other | Admitting: *Deleted

## 2018-07-16 VITALS — BP 131/72 | HR 104 | Temp 99.5°F

## 2018-07-16 DIAGNOSIS — O0933 Supervision of pregnancy with insufficient antenatal care, third trimester: Secondary | ICD-10-CM

## 2018-07-16 DIAGNOSIS — Z362 Encounter for other antenatal screening follow-up: Secondary | ICD-10-CM

## 2018-07-16 DIAGNOSIS — O09293 Supervision of pregnancy with other poor reproductive or obstetric history, third trimester: Secondary | ICD-10-CM | POA: Diagnosis not present

## 2018-07-16 DIAGNOSIS — O10919 Unspecified pre-existing hypertension complicating pregnancy, unspecified trimester: Secondary | ICD-10-CM | POA: Diagnosis present

## 2018-07-16 DIAGNOSIS — O99213 Obesity complicating pregnancy, third trimester: Secondary | ICD-10-CM

## 2018-07-16 DIAGNOSIS — O10913 Unspecified pre-existing hypertension complicating pregnancy, third trimester: Secondary | ICD-10-CM

## 2018-07-16 DIAGNOSIS — O99283 Endocrine, nutritional and metabolic diseases complicating pregnancy, third trimester: Secondary | ICD-10-CM

## 2018-07-16 DIAGNOSIS — O10013 Pre-existing essential hypertension complicating pregnancy, third trimester: Secondary | ICD-10-CM | POA: Diagnosis not present

## 2018-07-16 DIAGNOSIS — O99343 Other mental disorders complicating pregnancy, third trimester: Secondary | ICD-10-CM

## 2018-07-16 DIAGNOSIS — Z3A30 30 weeks gestation of pregnancy: Secondary | ICD-10-CM

## 2018-07-16 DIAGNOSIS — E039 Hypothyroidism, unspecified: Secondary | ICD-10-CM

## 2018-07-17 ENCOUNTER — Telehealth (INDEPENDENT_AMBULATORY_CARE_PROVIDER_SITE_OTHER): Payer: Medicaid Other | Admitting: Obstetrics

## 2018-07-17 ENCOUNTER — Encounter: Payer: Self-pay | Admitting: Obstetrics

## 2018-07-17 ENCOUNTER — Other Ambulatory Visit (HOSPITAL_COMMUNITY): Payer: Self-pay | Admitting: *Deleted

## 2018-07-17 DIAGNOSIS — F32A Depression, unspecified: Secondary | ICD-10-CM

## 2018-07-17 DIAGNOSIS — O99340 Other mental disorders complicating pregnancy, unspecified trimester: Secondary | ICD-10-CM

## 2018-07-17 DIAGNOSIS — O10913 Unspecified pre-existing hypertension complicating pregnancy, third trimester: Secondary | ICD-10-CM

## 2018-07-17 DIAGNOSIS — Z8759 Personal history of other complications of pregnancy, childbirth and the puerperium: Secondary | ICD-10-CM

## 2018-07-17 DIAGNOSIS — D563 Thalassemia minor: Secondary | ICD-10-CM

## 2018-07-17 DIAGNOSIS — O99343 Other mental disorders complicating pregnancy, third trimester: Secondary | ICD-10-CM

## 2018-07-17 DIAGNOSIS — O10919 Unspecified pre-existing hypertension complicating pregnancy, unspecified trimester: Secondary | ICD-10-CM

## 2018-07-17 DIAGNOSIS — E039 Hypothyroidism, unspecified: Secondary | ICD-10-CM

## 2018-07-17 DIAGNOSIS — F329 Major depressive disorder, single episode, unspecified: Secondary | ICD-10-CM

## 2018-07-17 DIAGNOSIS — O99283 Endocrine, nutritional and metabolic diseases complicating pregnancy, third trimester: Secondary | ICD-10-CM

## 2018-07-17 DIAGNOSIS — O0990 Supervision of high risk pregnancy, unspecified, unspecified trimester: Secondary | ICD-10-CM
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
## 2018-07-17 NOTE — Progress Notes (Signed)
CC: None  

## 2018-07-30 ENCOUNTER — Ambulatory Visit (HOSPITAL_COMMUNITY)
Admission: RE | Admit: 2018-07-30 | Discharge: 2018-07-30 | Disposition: A | Discharge status: Home / Self Care | Payer: Medicaid Other | Source: Ambulatory Visit | Attending: Maternal & Fetal Medicine | Admitting: Maternal & Fetal Medicine

## 2018-07-30 ENCOUNTER — Ambulatory Visit (HOSPITAL_COMMUNITY): Payer: Medicaid Other | Admitting: *Deleted

## 2018-07-30 ENCOUNTER — Other Ambulatory Visit: Payer: Self-pay

## 2018-07-30 ENCOUNTER — Encounter (HOSPITAL_COMMUNITY): Payer: Self-pay

## 2018-07-30 VITALS — BP 134/73 | HR 100 | Temp 98.8°F

## 2018-07-30 DIAGNOSIS — O09293 Supervision of pregnancy with other poor reproductive or obstetric history, third trimester: Secondary | ICD-10-CM

## 2018-07-30 DIAGNOSIS — O10919 Unspecified pre-existing hypertension complicating pregnancy, unspecified trimester: Secondary | ICD-10-CM | POA: Insufficient documentation

## 2018-07-30 DIAGNOSIS — E039 Hypothyroidism, unspecified: Secondary | ICD-10-CM | POA: Diagnosis not present

## 2018-07-30 DIAGNOSIS — O99213 Obesity complicating pregnancy, third trimester: Secondary | ICD-10-CM

## 2018-07-30 DIAGNOSIS — O10013 Pre-existing essential hypertension complicating pregnancy, third trimester: Secondary | ICD-10-CM

## 2018-07-30 DIAGNOSIS — O10913 Unspecified pre-existing hypertension complicating pregnancy, third trimester: Secondary | ICD-10-CM

## 2018-07-30 DIAGNOSIS — Z3A32 32 weeks gestation of pregnancy: Secondary | ICD-10-CM

## 2018-07-30 DIAGNOSIS — O0933 Supervision of pregnancy with insufficient antenatal care, third trimester: Secondary | ICD-10-CM

## 2018-07-30 DIAGNOSIS — O99343 Other mental disorders complicating pregnancy, third trimester: Secondary | ICD-10-CM

## 2018-07-30 DIAGNOSIS — O99283 Endocrine, nutritional and metabolic diseases complicating pregnancy, third trimester: Secondary | ICD-10-CM | POA: Diagnosis not present

## 2018-07-31 ENCOUNTER — Ambulatory Visit (INDEPENDENT_AMBULATORY_CARE_PROVIDER_SITE_OTHER): Payer: Medicaid Other | Admitting: Obstetrics

## 2018-07-31 ENCOUNTER — Encounter: Payer: Self-pay | Admitting: Obstetrics

## 2018-07-31 DIAGNOSIS — F32A Depression, unspecified: Secondary | ICD-10-CM

## 2018-07-31 DIAGNOSIS — Z8759 Personal history of other complications of pregnancy, childbirth and the puerperium: Secondary | ICD-10-CM | POA: Diagnosis not present

## 2018-07-31 DIAGNOSIS — O10919 Unspecified pre-existing hypertension complicating pregnancy, unspecified trimester: Secondary | ICD-10-CM

## 2018-07-31 DIAGNOSIS — Z3A32 32 weeks gestation of pregnancy: Secondary | ICD-10-CM

## 2018-07-31 DIAGNOSIS — O99283 Endocrine, nutritional and metabolic diseases complicating pregnancy, third trimester: Secondary | ICD-10-CM

## 2018-07-31 DIAGNOSIS — O99340 Other mental disorders complicating pregnancy, unspecified trimester: Secondary | ICD-10-CM

## 2018-07-31 DIAGNOSIS — F329 Major depressive disorder, single episode, unspecified: Secondary | ICD-10-CM

## 2018-07-31 DIAGNOSIS — O0990 Supervision of high risk pregnancy, unspecified, unspecified trimester: Secondary | ICD-10-CM

## 2018-07-31 DIAGNOSIS — O99343 Other mental disorders complicating pregnancy, third trimester: Secondary | ICD-10-CM

## 2018-07-31 DIAGNOSIS — E039 Hypothyroidism, unspecified: Secondary | ICD-10-CM

## 2018-07-31 DIAGNOSIS — O98813 Other maternal infectious and parasitic diseases complicating pregnancy, third trimester: Secondary | ICD-10-CM

## 2018-07-31 DIAGNOSIS — B009 Herpesviral infection, unspecified: Secondary | ICD-10-CM

## 2018-07-31 DIAGNOSIS — O10913 Unspecified pre-existing hypertension complicating pregnancy, third trimester: Secondary | ICD-10-CM

## 2018-07-31 DIAGNOSIS — D563 Thalassemia minor: Secondary | ICD-10-CM

## 2018-07-31 DIAGNOSIS — O0993 Supervision of high risk pregnancy, unspecified, third trimester: Secondary | ICD-10-CM
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
## 2018-07-31 NOTE — Progress Notes (Signed)
S/w pt for webex visit. Pt reports fetal movement, denies pain. Pt states that she is not at home and she does not have BP cuff with her. Provider connected to webex after I gave pt code, pt not in meeting room, called pt twice, no answer. Pt changed to tele visit, due to not being able to connect to webex.

## 2018-08-01 ENCOUNTER — Other Ambulatory Visit: Payer: Self-pay | Admitting: Obstetrics

## 2018-08-01 ENCOUNTER — Telehealth: Payer: Self-pay

## 2018-08-01 DIAGNOSIS — K5901 Slow transit constipation: Secondary | ICD-10-CM

## 2018-08-01 MED ORDER — POLYETHYLENE GLYCOL 3350 17 G PO PACK: 17.0000 g | PACK | Freq: Every day | ORAL | 0 refills | Status: DC

## 2018-08-01 MED ORDER — DOCUSATE SODIUM 100 MG PO CAPS: 100.0000 mg | ORAL_CAPSULE | Freq: Two times a day (BID) | ORAL | 5 refills | Status: DC

## 2018-08-01 NOTE — Telephone Encounter (Signed)
Pt requests rx for constipation.

## 2018-08-02 ENCOUNTER — Encounter (HOSPITAL_COMMUNITY): Payer: Self-pay | Admitting: *Deleted

## 2018-08-02 ENCOUNTER — Other Ambulatory Visit: Payer: Self-pay

## 2018-08-02 ENCOUNTER — Inpatient Hospital Stay (HOSPITAL_COMMUNITY)
Admission: AD | Admit: 2018-08-02 | Discharge: 2018-08-02 | Disposition: A | Discharge status: Home / Self Care | Payer: Medicaid Other | Attending: Family Medicine | Admitting: Family Medicine

## 2018-08-02 DIAGNOSIS — O288 Other abnormal findings on antenatal screening of mother: Secondary | ICD-10-CM

## 2018-08-02 DIAGNOSIS — K5901 Slow transit constipation: Secondary | ICD-10-CM | POA: Diagnosis not present

## 2018-08-02 DIAGNOSIS — O99613 Diseases of the digestive system complicating pregnancy, third trimester: Secondary | ICD-10-CM | POA: Diagnosis not present

## 2018-08-02 DIAGNOSIS — Z3A32 32 weeks gestation of pregnancy: Secondary | ICD-10-CM | POA: Insufficient documentation

## 2018-08-02 DIAGNOSIS — R1032 Left lower quadrant pain: Secondary | ICD-10-CM | POA: Diagnosis present

## 2018-08-02 DIAGNOSIS — O368130 Decreased fetal movements, third trimester, not applicable or unspecified: Secondary | ICD-10-CM | POA: Diagnosis not present

## 2018-08-02 DIAGNOSIS — K5900 Constipation, unspecified: Secondary | ICD-10-CM

## 2018-08-02 MED ORDER — POLYETHYLENE GLYCOL 3350 17 G PO PACK: 17.0000 g | PACK | Freq: Two times a day (BID) | ORAL | 1 refills | Status: DC

## 2018-08-02 NOTE — Discharge Instructions (Signed)
Constipation, Adult Constipation is when a person:  Poops (has a bowel movement) fewer times in a week than normal.  Has a hard time pooping.  Has poop that is dry, hard, or bigger than normal. Follow these instructions at home: Eating and drinking   Eat foods that have a lot of fiber, such as: ? Fresh fruits and vegetables. ? Whole grains. ? Beans.  Eat less of foods that are high in fat, low in fiber, or overly processed, such as: ? [place] fries. ? Hamburgers. ? Cookies. ? Candy. ? Soda.  Drink enough fluid to keep your pee (urine) clear or pale yellow. General instructions  Exercise regularly or as told by your doctor.  Go to the restroom when you feel like you need to poop. Do not hold it in.  Take over-the-counter and prescription medicines only as told by your doctor. These include any fiber supplements.  Do pelvic floor retraining exercises, such as: ? Doing deep breathing while relaxing your lower belly (abdomen). ? Relaxing your pelvic floor while pooping.  Watch your condition for any changes.  Keep all follow-up visits as told by your doctor. This is important. Contact a doctor if:  You have pain that gets worse.  You have a fever.  You have not pooped for 4 days.  You throw up (vomit).  You are not hungry.  You lose weight.  You are bleeding from the anus.  You have thin, pencil-like poop (stool). Get help right away if:  You have a fever, and your symptoms suddenly get worse.  You leak poop or have blood in your poop.  Your belly feels hard or bigger than normal (is bloated).  You have very bad belly pain.  You feel dizzy or you faint. This information is not intended to replace advice given to you by your health care provider. Make sure you discuss any questions you have with your health care provider. Document Released: 07/11/2007 Document Revised: 01/04/2017 Document Reviewed: 07/13/2015 Elsevier Patient Education  2020 [place].   Fetal Movement Counts Patient Name: ________________________________________________ Patient Due Date: ____________________ What is a fetal movement count?  A fetal movement count is the number of times that you feel your baby move during a certain amount of time. This may also be called a fetal kick count. A fetal movement count is recommended for every pregnant woman. You may be asked to start counting fetal movements as early as week 28 of your pregnancy. Pay attention to when your baby is most active. You may notice your baby's sleep and wake cycles. You may also notice things that make your baby move more. You should do a fetal movement count:  When your baby is normally most active.  At the same time each day. A good time to count movements is while you are resting, after having something to eat and drink. How do I count fetal movements? 1. Find a quiet, comfortable area. Sit, or lie down on your side. 2. Write down the date, the start time and stop time, and the number of movements that you felt between those two times. Take this information with you to your health care visits. 3. For 2 hours, count kicks, flutters, swishes, rolls, and jabs. You should feel at least 10 movements during 2 hours. 4. You may stop counting after you have felt 10 movements. 5. If you do not feel 10 movements in 2 hours, have something to eat and drink. Then, keep resting and counting  for 1 hour. If you feel at least 4 movements during that hour, you may stop counting. Contact a health care provider if:  You feel fewer than 4 movements in 2 hours.  Your baby is not moving like he or she usually does. Date: ____________ Start time: ____________ Stop time: ____________ Movements: ____________ Date: ____________ Start time: ____________ Stop time: ____________ Movements: ____________ Date: ____________ Start time: ____________ Stop time: ____________ Movements: ____________ Date: ____________ Start  time: ____________ Stop time: ____________ Movements: ____________ Date: ____________ Start time: ____________ Stop time: ____________ Movements: ____________ Date: ____________ Start time: ____________ Stop time: ____________ Movements: ____________ Date: ____________ Start time: ____________ Stop time: ____________ Movements: ____________ Date: ____________ Start time: ____________ Stop time: ____________ Movements: ____________ Date: ____________ Start time: ____________ Stop time: ____________ Movements: ____________ This information is not intended to replace advice given to you by your health care provider. Make sure you discuss any questions you have with your health care provider. Document Released: 02/21/2006 Document Revised: 02/11/2018 Document Reviewed: 03/03/2015 Elsevier Patient Education  2020 [employer]

## 2018-08-02 NOTE — MAU Note (Signed)
Danielle [name] is a 33 y.o. at [redacted]w[redacted]d here in MAU reporting: states she has not had a BM in almost 2 weeks. Passed some small pieces of stool this morning but no relief. Having left lower abdominal pain that radiates to her back. No vaginal bleeding or LOF. States she has felt fetal movement but it is less than normal.   Onset of complaint: ongoing  Pain score: 10/10  Vitals:   08/02/18 1245  BP: 119/87  Pulse: (!) 125  Resp: 18  Temp: 98.3 F (36.8 C)  SpO2: 100%     FHT:158  Lab orders placed from triage: UA
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
## 2018-08-04 NOTE — Telephone Encounter (Signed)
Colace and Miralax was sent to the pharmacy. Pt notified.

## 2018-08-06 ENCOUNTER — Other Ambulatory Visit: Payer: Self-pay

## 2018-08-06 ENCOUNTER — Encounter (HOSPITAL_COMMUNITY): Payer: Self-pay

## 2018-08-06 ENCOUNTER — Ambulatory Visit (HOSPITAL_COMMUNITY)
Admission: RE | Admit: 2018-08-06 | Discharge: 2018-08-06 | Disposition: A | Discharge status: Home / Self Care | Payer: Medicaid Other | Source: Ambulatory Visit | Attending: Maternal & Fetal Medicine | Admitting: Maternal & Fetal Medicine

## 2018-08-06 ENCOUNTER — Ambulatory Visit (HOSPITAL_COMMUNITY): Payer: Medicaid Other | Admitting: *Deleted

## 2018-08-06 ENCOUNTER — Other Ambulatory Visit (HOSPITAL_COMMUNITY): Payer: Self-pay | Admitting: Maternal & Fetal Medicine

## 2018-08-06 VITALS — BP 139/67 | HR 90 | Temp 98.7°F

## 2018-08-06 DIAGNOSIS — O10919 Unspecified pre-existing hypertension complicating pregnancy, unspecified trimester: Secondary | ICD-10-CM

## 2018-08-06 DIAGNOSIS — O0933 Supervision of pregnancy with insufficient antenatal care, third trimester: Secondary | ICD-10-CM

## 2018-08-06 DIAGNOSIS — O99343 Other mental disorders complicating pregnancy, third trimester: Secondary | ICD-10-CM

## 2018-08-06 DIAGNOSIS — E039 Hypothyroidism, unspecified: Secondary | ICD-10-CM | POA: Diagnosis not present

## 2018-08-06 DIAGNOSIS — O10013 Pre-existing essential hypertension complicating pregnancy, third trimester: Secondary | ICD-10-CM

## 2018-08-06 DIAGNOSIS — O99213 Obesity complicating pregnancy, third trimester: Secondary | ICD-10-CM

## 2018-08-06 DIAGNOSIS — O09293 Supervision of pregnancy with other poor reproductive or obstetric history, third trimester: Secondary | ICD-10-CM

## 2018-08-06 DIAGNOSIS — O10913 Unspecified pre-existing hypertension complicating pregnancy, third trimester: Secondary | ICD-10-CM

## 2018-08-06 DIAGNOSIS — O99283 Endocrine, nutritional and metabolic diseases complicating pregnancy, third trimester: Secondary | ICD-10-CM | POA: Diagnosis not present

## 2018-08-06 DIAGNOSIS — Z3A33 33 weeks gestation of pregnancy: Secondary | ICD-10-CM

## 2018-08-06 NOTE — Procedures (Signed)
Danielle [name] 1985-04-16 [redacted]w[redacted]d  Fetus A Non-Stress Test Interpretation for 08/06/18  Indication: Chronic Hypertenstion and Unsatisfactory BPP  Fetal Heart Rate A Mode: External Baseline Rate (A): 140 bpm Variability: Moderate Accelerations: 15 x 15 Decelerations: None Multiple birth?: No  Uterine Activity Mode: Toco Contraction Frequency (min): none noted  Interpretation (Fetal Testing) Nonstress Test Interpretation: Reactive Comments: FHR tracing rev'd by Dr. Gertie Exon

## 2018-08-13 ENCOUNTER — Ambulatory Visit (HOSPITAL_COMMUNITY): Payer: Medicaid Other | Attending: Maternal & Fetal Medicine

## 2018-08-13 ENCOUNTER — Ambulatory Visit (HOSPITAL_COMMUNITY): Payer: Medicaid Other

## 2018-08-14 ENCOUNTER — Ambulatory Visit (INDEPENDENT_AMBULATORY_CARE_PROVIDER_SITE_OTHER): Payer: Medicaid Other | Admitting: Family Medicine

## 2018-08-14 VITALS — BP 119/65 | HR 95

## 2018-08-14 DIAGNOSIS — O0990 Supervision of high risk pregnancy, unspecified, unspecified trimester: Secondary | ICD-10-CM

## 2018-08-14 DIAGNOSIS — O0993 Supervision of high risk pregnancy, unspecified, third trimester: Secondary | ICD-10-CM

## 2018-08-14 DIAGNOSIS — O10919 Unspecified pre-existing hypertension complicating pregnancy, unspecified trimester: Secondary | ICD-10-CM

## 2018-08-14 DIAGNOSIS — K219 Gastro-esophageal reflux disease without esophagitis: Secondary | ICD-10-CM

## 2018-08-14 DIAGNOSIS — O99613 Diseases of the digestive system complicating pregnancy, third trimester: Secondary | ICD-10-CM

## 2018-08-14 DIAGNOSIS — O10913 Unspecified pre-existing hypertension complicating pregnancy, third trimester: Secondary | ICD-10-CM

## 2018-08-14 DIAGNOSIS — Z3A34 34 weeks gestation of pregnancy: Secondary | ICD-10-CM

## 2018-08-14 MED ORDER — OMEPRAZOLE MAGNESIUM 20 MG PO TBEC: 20.0000 mg | DELAYED_RELEASE_TABLET | Freq: Every day | ORAL | 3 refills | Status: DC

## 2018-08-14 NOTE — Progress Notes (Signed)
I connected with  Danielle [name] on 08/14/18 by a video enabled telemedicine application and verified that I am speaking with the correct person using two identifiers.   WebEx OB.  Reports no problems today.
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
## 2018-08-14 NOTE — Patient Instructions (Signed)

## 2018-08-22 ENCOUNTER — Encounter (HOSPITAL_COMMUNITY): Payer: Self-pay

## 2018-08-22 ENCOUNTER — Ambulatory Visit (HOSPITAL_COMMUNITY)
Admission: RE | Admit: 2018-08-22 | Discharge: 2018-08-22 | Disposition: A | Discharge status: Home / Self Care | Payer: Medicaid Other | Source: Ambulatory Visit | Attending: Maternal & Fetal Medicine | Admitting: Maternal & Fetal Medicine

## 2018-08-22 ENCOUNTER — Ambulatory Visit (HOSPITAL_COMMUNITY): Payer: Medicaid Other | Admitting: *Deleted

## 2018-08-22 ENCOUNTER — Other Ambulatory Visit: Payer: Self-pay

## 2018-08-22 VITALS — BP 103/58 | HR 101 | Temp 98.3°F

## 2018-08-22 DIAGNOSIS — Z3A35 35 weeks gestation of pregnancy: Secondary | ICD-10-CM

## 2018-08-22 DIAGNOSIS — Z362 Encounter for other antenatal screening follow-up: Secondary | ICD-10-CM

## 2018-08-22 DIAGNOSIS — E039 Hypothyroidism, unspecified: Secondary | ICD-10-CM | POA: Diagnosis not present

## 2018-08-22 DIAGNOSIS — O09293 Supervision of pregnancy with other poor reproductive or obstetric history, third trimester: Secondary | ICD-10-CM | POA: Diagnosis not present

## 2018-08-22 DIAGNOSIS — O99283 Endocrine, nutritional and metabolic diseases complicating pregnancy, third trimester: Secondary | ICD-10-CM | POA: Diagnosis not present

## 2018-08-22 DIAGNOSIS — O10919 Unspecified pre-existing hypertension complicating pregnancy, unspecified trimester: Secondary | ICD-10-CM | POA: Insufficient documentation

## 2018-08-22 DIAGNOSIS — O99343 Other mental disorders complicating pregnancy, third trimester: Secondary | ICD-10-CM

## 2018-08-22 DIAGNOSIS — O10913 Unspecified pre-existing hypertension complicating pregnancy, third trimester: Secondary | ICD-10-CM | POA: Diagnosis not present

## 2018-08-22 DIAGNOSIS — O10013 Pre-existing essential hypertension complicating pregnancy, third trimester: Secondary | ICD-10-CM

## 2018-08-22 DIAGNOSIS — O99213 Obesity complicating pregnancy, third trimester: Secondary | ICD-10-CM

## 2018-08-22 DIAGNOSIS — O0933 Supervision of pregnancy with insufficient antenatal care, third trimester: Secondary | ICD-10-CM

## 2018-08-25 ENCOUNTER — Other Ambulatory Visit (HOSPITAL_COMMUNITY): Payer: Self-pay | Admitting: *Deleted

## 2018-08-25 DIAGNOSIS — O10913 Unspecified pre-existing hypertension complicating pregnancy, third trimester: Secondary | ICD-10-CM

## 2018-08-28 ENCOUNTER — Other Ambulatory Visit: Payer: Self-pay

## 2018-08-28 ENCOUNTER — Encounter: Payer: Self-pay | Admitting: Obstetrics and Gynecology

## 2018-08-28 ENCOUNTER — Ambulatory Visit (INDEPENDENT_AMBULATORY_CARE_PROVIDER_SITE_OTHER): Payer: Medicaid Other | Admitting: Obstetrics and Gynecology

## 2018-08-28 ENCOUNTER — Other Ambulatory Visit (HOSPITAL_COMMUNITY)
Admission: RE | Admit: 2018-08-28 | Discharge: 2018-08-28 | Disposition: A | Discharge status: Home / Self Care | Payer: Medicaid Other | Source: Ambulatory Visit | Attending: Obstetrics and Gynecology | Admitting: Obstetrics and Gynecology

## 2018-08-28 VITALS — Wt 249.7 lb

## 2018-08-28 DIAGNOSIS — O10913 Unspecified pre-existing hypertension complicating pregnancy, third trimester: Secondary | ICD-10-CM

## 2018-08-28 DIAGNOSIS — B009 Herpesviral infection, unspecified: Secondary | ICD-10-CM

## 2018-08-28 DIAGNOSIS — O0990 Supervision of high risk pregnancy, unspecified, unspecified trimester: Secondary | ICD-10-CM | POA: Insufficient documentation

## 2018-08-28 DIAGNOSIS — O10919 Unspecified pre-existing hypertension complicating pregnancy, unspecified trimester: Secondary | ICD-10-CM

## 2018-08-28 DIAGNOSIS — Z3A36 36 weeks gestation of pregnancy: Secondary | ICD-10-CM

## 2018-08-28 DIAGNOSIS — O0993 Supervision of high risk pregnancy, unspecified, third trimester: Secondary | ICD-10-CM
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
## 2018-08-28 NOTE — Progress Notes (Signed)
Pt is here for ROB, [redacted]w[redacted]d. Pt has BPP scheduled for tomorrow.

## 2018-08-29 ENCOUNTER — Ambulatory Visit (HOSPITAL_COMMUNITY)
Admission: RE | Admit: 2018-08-29 | Discharge: 2018-08-29 | Disposition: A | Discharge status: Home / Self Care | Payer: Medicaid Other | Source: Ambulatory Visit | Attending: Obstetrics and Gynecology | Admitting: Obstetrics and Gynecology

## 2018-08-29 ENCOUNTER — Ambulatory Visit (HOSPITAL_COMMUNITY): Payer: Medicaid Other

## 2018-08-29 ENCOUNTER — Ambulatory Visit (HOSPITAL_COMMUNITY): Payer: Medicaid Other | Admitting: *Deleted

## 2018-08-29 ENCOUNTER — Encounter (HOSPITAL_COMMUNITY): Payer: Self-pay | Admitting: *Deleted

## 2018-08-29 ENCOUNTER — Encounter (HOSPITAL_COMMUNITY): Payer: Self-pay

## 2018-08-29 VITALS — BP 126/64 | HR 127 | Temp 98.8°F

## 2018-08-29 DIAGNOSIS — O09293 Supervision of pregnancy with other poor reproductive or obstetric history, third trimester: Secondary | ICD-10-CM | POA: Diagnosis not present

## 2018-08-29 DIAGNOSIS — E039 Hypothyroidism, unspecified: Secondary | ICD-10-CM | POA: Diagnosis not present

## 2018-08-29 DIAGNOSIS — O99213 Obesity complicating pregnancy, third trimester: Secondary | ICD-10-CM

## 2018-08-29 DIAGNOSIS — Z3A36 36 weeks gestation of pregnancy: Secondary | ICD-10-CM

## 2018-08-29 DIAGNOSIS — O99343 Other mental disorders complicating pregnancy, third trimester: Secondary | ICD-10-CM

## 2018-08-29 DIAGNOSIS — O10919 Unspecified pre-existing hypertension complicating pregnancy, unspecified trimester: Secondary | ICD-10-CM | POA: Insufficient documentation

## 2018-08-29 DIAGNOSIS — O0933 Supervision of pregnancy with insufficient antenatal care, third trimester: Secondary | ICD-10-CM

## 2018-08-29 DIAGNOSIS — O99283 Endocrine, nutritional and metabolic diseases complicating pregnancy, third trimester: Secondary | ICD-10-CM

## 2018-08-29 DIAGNOSIS — O10913 Unspecified pre-existing hypertension complicating pregnancy, third trimester: Secondary | ICD-10-CM | POA: Diagnosis not present

## 2018-08-29 NOTE — Progress Notes (Unsigned)
Pt left without having an NST.  Attempted to call patient for her to return to MFM for NST.  No answer.

## 2018-08-30 LAB — CERVICOVAGINAL ANCILLARY ONLY
Bacterial vaginitis: NEGATIVE
Candida vaginitis: NEGATIVE
Chlamydia: NEGATIVE
Neisseria Gonorrhea: NEGATIVE
Trichomonas: NEGATIVE

## 2018-08-30 LAB — STREP GP B NAA: Strep Gp B NAA: POSITIVE — AB

## 2018-09-01 ENCOUNTER — Encounter (HOSPITAL_COMMUNITY): Payer: Self-pay | Admitting: *Deleted

## 2018-09-04 ENCOUNTER — Ambulatory Visit (INDEPENDENT_AMBULATORY_CARE_PROVIDER_SITE_OTHER): Payer: Medicaid Other | Admitting: Certified Nurse Midwife

## 2018-09-04 ENCOUNTER — Encounter: Payer: Self-pay | Admitting: Certified Nurse Midwife

## 2018-09-04 ENCOUNTER — Other Ambulatory Visit: Payer: Self-pay

## 2018-09-04 VITALS — BP 139/85 | HR 111 | Wt 246.2 lb

## 2018-09-04 DIAGNOSIS — O10913 Unspecified pre-existing hypertension complicating pregnancy, third trimester: Secondary | ICD-10-CM

## 2018-09-04 DIAGNOSIS — O10919 Unspecified pre-existing hypertension complicating pregnancy, unspecified trimester: Secondary | ICD-10-CM

## 2018-09-04 DIAGNOSIS — O0990 Supervision of high risk pregnancy, unspecified, unspecified trimester: Secondary | ICD-10-CM

## 2018-09-04 DIAGNOSIS — Z3A37 37 weeks gestation of pregnancy: Secondary | ICD-10-CM

## 2018-09-04 DIAGNOSIS — O98813 Other maternal infectious and parasitic diseases complicating pregnancy, third trimester: Secondary | ICD-10-CM

## 2018-09-04 DIAGNOSIS — B009 Herpesviral infection, unspecified: Secondary | ICD-10-CM

## 2018-09-04 DIAGNOSIS — O0993 Supervision of high risk pregnancy, unspecified, third trimester: Secondary | ICD-10-CM
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
## 2018-09-04 NOTE — Patient Instructions (Signed)

## 2018-09-04 NOTE — Progress Notes (Signed)
Pt presents for ROB. Pt has no concerns today. 

## 2018-09-05 ENCOUNTER — Encounter (HOSPITAL_COMMUNITY): Payer: Self-pay | Admitting: *Deleted

## 2018-09-05 ENCOUNTER — Ambulatory Visit (HOSPITAL_COMMUNITY): Payer: Medicaid Other | Admitting: *Deleted

## 2018-09-05 ENCOUNTER — Ambulatory Visit (HOSPITAL_COMMUNITY)
Admission: RE | Admit: 2018-09-05 | Discharge: 2018-09-05 | Disposition: A | Discharge status: Home / Self Care | Payer: Medicaid Other | Source: Ambulatory Visit | Attending: Obstetrics and Gynecology | Admitting: Obstetrics and Gynecology

## 2018-09-05 VITALS — BP 137/73 | HR 95 | Temp 98.5°F

## 2018-09-05 DIAGNOSIS — O99343 Other mental disorders complicating pregnancy, third trimester: Secondary | ICD-10-CM

## 2018-09-05 DIAGNOSIS — O09293 Supervision of pregnancy with other poor reproductive or obstetric history, third trimester: Secondary | ICD-10-CM | POA: Diagnosis not present

## 2018-09-05 DIAGNOSIS — I10 Essential (primary) hypertension: Secondary | ICD-10-CM | POA: Insufficient documentation

## 2018-09-05 DIAGNOSIS — O10919 Unspecified pre-existing hypertension complicating pregnancy, unspecified trimester: Secondary | ICD-10-CM | POA: Insufficient documentation

## 2018-09-05 DIAGNOSIS — O10213 Pre-existing hypertensive chronic kidney disease complicating pregnancy, third trimester: Secondary | ICD-10-CM | POA: Diagnosis not present

## 2018-09-05 DIAGNOSIS — O10913 Unspecified pre-existing hypertension complicating pregnancy, third trimester: Secondary | ICD-10-CM

## 2018-09-05 DIAGNOSIS — O368130 Decreased fetal movements, third trimester, not applicable or unspecified: Secondary | ICD-10-CM | POA: Diagnosis not present

## 2018-09-05 DIAGNOSIS — Z3A37 37 weeks gestation of pregnancy: Secondary | ICD-10-CM

## 2018-09-05 NOTE — Procedures (Signed)
Danielle [name] 09-Aug-1985 [redacted]w[redacted]d  Fetus A Non-Stress Test Interpretation for 09/05/18  Indication: Unsatisfactory BPP  Fetal Heart Rate A Mode: External Baseline Rate (A): 145 bpm Variability: Moderate Accelerations: 15 x 15 Decelerations: None Multiple birth?: No  Uterine Activity Mode: Palpation, Toco Contraction Frequency (min): Occas. UI Contraction Quality: Mild Resting Tone Palpated: Relaxed Resting Time: Adequate  Interpretation (Fetal Testing) Nonstress Test Interpretation: Reactive Comments: EFM tracing reviewed by Dr. Donalee Citrin

## 2018-09-08 ENCOUNTER — Other Ambulatory Visit: Payer: Self-pay

## 2018-09-08 ENCOUNTER — Other Ambulatory Visit (HOSPITAL_COMMUNITY)
Admission: RE | Admit: 2018-09-08 | Discharge: 2018-09-08 | Disposition: A | Discharge status: Home / Self Care | Payer: Medicaid Other | Source: Ambulatory Visit | Attending: Obstetrics and Gynecology | Admitting: Obstetrics and Gynecology

## 2018-09-08 ENCOUNTER — Telehealth (HOSPITAL_COMMUNITY): Payer: Self-pay | Admitting: *Deleted

## 2018-09-08 ENCOUNTER — Other Ambulatory Visit (HOSPITAL_COMMUNITY): Payer: Self-pay | Admitting: Obstetrics and Gynecology

## 2018-09-08 DIAGNOSIS — Z20828 Contact with and (suspected) exposure to other viral communicable diseases: Secondary | ICD-10-CM | POA: Diagnosis not present

## 2018-09-08 DIAGNOSIS — Z01812 Encounter for preprocedural laboratory examination: Secondary | ICD-10-CM | POA: Diagnosis not present

## 2018-09-08 DIAGNOSIS — O10913 Unspecified pre-existing hypertension complicating pregnancy, third trimester: Secondary | ICD-10-CM

## 2018-09-08 LAB — SARS CORONAVIRUS 2 (TAT 6-24 HRS): SARS Coronavirus 2: NEGATIVE

## 2018-09-08 NOTE — Telephone Encounter (Signed)
Preadmission screen  

## 2018-09-10 ENCOUNTER — Inpatient Hospital Stay (HOSPITAL_COMMUNITY): Payer: Medicaid Other

## 2018-09-10 ENCOUNTER — Other Ambulatory Visit: Payer: Self-pay

## 2018-09-10 ENCOUNTER — Inpatient Hospital Stay (HOSPITAL_COMMUNITY)
Admission: AD | Admit: 2018-09-10 | Discharge: 2018-09-12 | DRG: 806 | Disposition: A | Discharge status: Home / Self Care | Payer: Medicaid Other | Attending: Obstetrics and Gynecology | Admitting: Obstetrics and Gynecology

## 2018-09-10 ENCOUNTER — Encounter (HOSPITAL_COMMUNITY): Payer: Self-pay | Admitting: Certified Nurse Midwife

## 2018-09-10 DIAGNOSIS — O99824 Streptococcus B carrier state complicating childbirth: Secondary | ICD-10-CM | POA: Diagnosis present

## 2018-09-10 DIAGNOSIS — O99214 Obesity complicating childbirth: Secondary | ICD-10-CM | POA: Diagnosis present

## 2018-09-10 DIAGNOSIS — O34219 Maternal care for unspecified type scar from previous cesarean delivery: Secondary | ICD-10-CM | POA: Diagnosis not present

## 2018-09-10 DIAGNOSIS — Z3A38 38 weeks gestation of pregnancy: Secondary | ICD-10-CM | POA: Diagnosis not present

## 2018-09-10 DIAGNOSIS — O114 Pre-existing hypertension with pre-eclampsia, complicating childbirth: Secondary | ICD-10-CM | POA: Diagnosis present

## 2018-09-10 DIAGNOSIS — O1002 Pre-existing essential hypertension complicating childbirth: Secondary | ICD-10-CM | POA: Diagnosis present

## 2018-09-10 DIAGNOSIS — O9832 Other infections with a predominantly sexual mode of transmission complicating childbirth: Secondary | ICD-10-CM | POA: Diagnosis present

## 2018-09-10 DIAGNOSIS — O10919 Unspecified pre-existing hypertension complicating pregnancy, unspecified trimester: Secondary | ICD-10-CM | POA: Diagnosis present

## 2018-09-10 DIAGNOSIS — O119 Pre-existing hypertension with pre-eclampsia, unspecified trimester: Secondary | ICD-10-CM

## 2018-09-10 DIAGNOSIS — O99344 Other mental disorders complicating childbirth: Secondary | ICD-10-CM | POA: Diagnosis present

## 2018-09-10 DIAGNOSIS — F329 Major depressive disorder, single episode, unspecified: Secondary | ICD-10-CM | POA: Diagnosis present

## 2018-09-10 DIAGNOSIS — E669 Obesity, unspecified: Secondary | ICD-10-CM | POA: Diagnosis present

## 2018-09-10 DIAGNOSIS — K5901 Slow transit constipation: Secondary | ICD-10-CM

## 2018-09-10 DIAGNOSIS — F4310 Post-traumatic stress disorder, unspecified: Secondary | ICD-10-CM | POA: Diagnosis present

## 2018-09-10 DIAGNOSIS — O0990 Supervision of high risk pregnancy, unspecified, unspecified trimester: Secondary | ICD-10-CM

## 2018-09-10 DIAGNOSIS — A6000 Herpesviral infection of urogenital system, unspecified: Secondary | ICD-10-CM | POA: Diagnosis present

## 2018-09-10 LAB — COMPREHENSIVE METABOLIC PANEL
ALT: 8 U/L (ref 0–44)
AST: 15 U/L (ref 15–41)
Albumin: 2.7 g/dL — ABNORMAL LOW (ref 3.5–5.0)
Alkaline Phosphatase: 109 U/L (ref 38–126)
Anion gap: 11 (ref 5–15)
BUN: 5 mg/dL — ABNORMAL LOW (ref 6–20)
CO2: 19 mmol/L — ABNORMAL LOW (ref 22–32)
Calcium: 8.8 mg/dL — ABNORMAL LOW (ref 8.9–10.3)
Chloride: 108 mmol/L (ref 98–111)
Creatinine, Ser: 0.51 mg/dL (ref 0.44–1.00)
GFR calc Af Amer: >60 mL/min (ref >60)
GFR calc non Af Amer: >60 mL/min (ref >60)
Glucose, Bld: 99 mg/dL (ref 70–99)
Potassium: 3.5 mmol/L (ref 3.5–5.1)
Sodium: 138 mmol/L (ref 135–145)
Total Bilirubin: 0.5 mg/dL (ref 0.3–1.2)
Total Protein: 5.8 g/dL — ABNORMAL LOW (ref 6.5–8.1)

## 2018-09-10 LAB — PROTEIN / CREATININE RATIO, URINE
Creatinine, Urine: 153.18 mg/dL
Protein Creatinine Ratio: 0.42 mg/mg{Cre} — ABNORMAL HIGH (ref 0.00–0.15)
Total Protein, Urine: 64 mg/dL

## 2018-09-10 LAB — TYPE AND SCREEN
ABO/RH(D): O POS
Antibody Screen: NEGATIVE

## 2018-09-10 LAB — CBC
HCT: 32.6 % — ABNORMAL LOW (ref 36.0–46.0)
Hemoglobin: 9.6 g/dL — ABNORMAL LOW (ref 12.0–15.0)
MCH: 19.7 pg — ABNORMAL LOW (ref 26.0–34.0)
MCHC: 29.4 g/dL — ABNORMAL LOW (ref 30.0–36.0)
MCV: 66.9 fL — ABNORMAL LOW (ref 80.0–100.0)
Platelets: 190 10*3/uL (ref 150–400)
RBC: 4.87 MIL/uL (ref 3.87–5.11)
RDW: 18.5 % — ABNORMAL HIGH (ref 11.5–15.5)
WBC: 6.1 10*3/uL (ref 4.0–10.5)
nRBC: 0 % (ref 0.0–0.2)

## 2018-09-10 LAB — ABO/RH: ABO/RH(D): O POS

## 2018-09-10 LAB — LACTATE DEHYDROGENASE: LDH: 116 U/L (ref 98–192)

## 2018-09-10 LAB — URIC ACID: Uric Acid, Serum: 3.9 mg/dL (ref 2.5–7.1)

## 2018-09-10 MED ORDER — OXYTOCIN BOLUS FROM INFUSION
500.0000 mL | Freq: Once | INTRAVENOUS | Status: AC
  Administered 2018-09-11: 500 mL via INTRAVENOUS

## 2018-09-10 MED ORDER — SODIUM CHLORIDE 0.9 % IV SOLN: 5.0000 10*6.[IU] | Freq: Once | INTRAVENOUS | Status: DC

## 2018-09-10 MED ORDER — LIDOCAINE HCL (PF) 1 % IJ SOLN: 30.0000 mL | INTRAMUSCULAR | Status: DC | PRN

## 2018-09-10 MED ORDER — OXYCODONE-ACETAMINOPHEN 5-325 MG PO TABS: 2.0000 | ORAL_TABLET | ORAL | Status: DC | PRN

## 2018-09-10 MED ORDER — LACTATED RINGERS IV SOLN
500.0000 mL | INTRAVENOUS | Status: DC | PRN
  Administered 2018-09-10: 500 mL via INTRAVENOUS

## 2018-09-10 MED ORDER — PENICILLIN G 3 MILLION UNITS IVPB - SIMPLE MED: 3.0000 10*6.[IU] | INTRAVENOUS | Status: DC

## 2018-09-10 MED ORDER — ONDANSETRON HCL 4 MG/2ML IJ SOLN: 4.0000 mg | Freq: Four times a day (QID) | INTRAMUSCULAR | Status: DC | PRN

## 2018-09-10 MED ORDER — FENTANYL CITRATE (PF) 100 MCG/2ML IJ SOLN
100.0000 ug | INTRAMUSCULAR | Status: DC | PRN
  Administered 2018-09-10: 100 ug via INTRAVENOUS
  Filled 2018-09-10: qty 2

## 2018-09-10 MED ORDER — OXYTOCIN 40 UNITS IN NORMAL SALINE INFUSION - SIMPLE MED
1.0000 m[IU]/min | INTRAVENOUS | Status: DC
  Administered 2018-09-10: 2 m[IU]/min via INTRAVENOUS

## 2018-09-10 MED ORDER — TERBUTALINE SULFATE 1 MG/ML IJ SOLN: 0.2500 mg | Freq: Once | INTRAMUSCULAR | Status: DC | PRN

## 2018-09-10 MED ORDER — SODIUM CHLORIDE 0.9 % IV SOLN
5.0000 10*6.[IU] | Freq: Once | INTRAVENOUS | Status: AC
  Administered 2018-09-10: 5 10*6.[IU] via INTRAVENOUS
  Filled 2018-09-10: qty 5

## 2018-09-10 MED ORDER — LABETALOL HCL 100 MG PO TABS
100.0000 mg | ORAL_TABLET | Freq: Two times a day (BID) | ORAL | Status: DC
  Administered 2018-09-10 – 2018-09-11 (×2): 100 mg via ORAL
  Filled 2018-09-10 (×2): qty 1

## 2018-09-10 MED ORDER — FENTANYL-BUPIVACAINE-NACL 0.5-0.125-0.9 MG/250ML-% EP SOLN
EPIDURAL | Status: AC
  Filled 2018-09-10: qty 250

## 2018-09-10 MED ORDER — OXYTOCIN 40 UNITS IN NORMAL SALINE INFUSION - SIMPLE MED
2.5000 [IU]/h | INTRAVENOUS | Status: DC
  Filled 2018-09-10: qty 1000

## 2018-09-10 MED ORDER — LACTATED RINGERS IV SOLN
INTRAVENOUS | Status: DC
  Administered 2018-09-10: 09:00:00 via INTRAVENOUS

## 2018-09-10 MED ORDER — SOD CITRATE-CITRIC ACID 500-334 MG/5ML PO SOLN: 30.0000 mL | ORAL | Status: DC | PRN

## 2018-09-10 MED ORDER — OXYTOCIN BOLUS FROM INFUSION: 500.0000 mL | Freq: Once | INTRAVENOUS | Status: DC

## 2018-09-10 MED ORDER — ACETAMINOPHEN 325 MG PO TABS: 650.0000 mg | ORAL_TABLET | ORAL | Status: DC | PRN

## 2018-09-10 MED ORDER — OXYCODONE-ACETAMINOPHEN 5-325 MG PO TABS: 1.0000 | ORAL_TABLET | ORAL | Status: DC | PRN

## 2018-09-10 MED ORDER — OXYTOCIN 40 UNITS IN NORMAL SALINE INFUSION - SIMPLE MED: 2.5000 [IU]/h | INTRAVENOUS | Status: DC

## 2018-09-10 MED ORDER — PENICILLIN G 3 MILLION UNITS IVPB - SIMPLE MED
3.0000 10*6.[IU] | INTRAVENOUS | Status: DC
  Administered 2018-09-10 – 2018-09-11 (×4): 3 10*6.[IU] via INTRAVENOUS
  Filled 2018-09-10 (×4): qty 100

## 2018-09-10 NOTE — Progress Notes (Signed)
OB/[facility]N Faculty Practice: Labor Progress Note  Subjective: Patient doing well without complaints. No CP, SOB, RUQ pain, vision changes, headaches.  Objective: BP 139/78   Pulse 86   Temp 98.2 F (36.8 C) (Oral)   Resp 17   Ht 5\' 8"  (1.727 m)   Wt 111.6 kg   LMP  (LMP Unknown)   BMI 37.40 kg/m  Gen: well appearing, NAD Dilation: 5 Effacement (%): 60 Station: -3 Presentation: Vertex Exam by:: Dr Everardo Pacific: baseline 150bpm, mod variability, +accels, no decels Toco: q 1-2 minutes  Assessment and Plan: 33 y.o. T5V2023 [redacted]w[redacted]d here for IOL-cHTN.  Labor: progressing well, AROM performed with clear fluid @2030 . Pit @22 . Will continue time appropriate cervical exams. 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  Fetal Well-Being: EFW 2806g (67%ile) at 35w u/s. Cephalic by cervical exam.  -- Category 1 - continuous fetal monitoring  -- GBS positive- adequate ppx with PCN   Corliss Blacker, PGY-III Family Medicine 8:54 PM

## 2018-09-10 NOTE — Progress Notes (Signed)
Labor Progress Note Danielle [name] is a 33 y.o. B0F7510 at [redacted]w[redacted]d presented for IOL for CHTN  S:  Comfortable, feeling a few ctx.  O:  BP (!) 145/99   Pulse (!) 116   Temp (!) 97.3 F (36.3 C) (Oral)   Resp 20   Ht 5\' 8"  (1.727 m)   Wt 111.6 kg   LMP  (LMP Unknown)   BMI 37.40 kg/m  EFM: baseline 145 bpm/ mod variability/ + accels/ no decels  Toco: irregular SVE: Dilation: 3 Effacement (%): 70 Station: -2 Exam by:: Dr. Janus Molder Pitocin: 12 mu/min  A/P: 33 y.o. C5E5277 [redacted]w[redacted]d  1. Labor: latent 2. FWB: Cat I 3. Pain: analgesia/anesthesia prn  Continue Pitocin titration. Anticipate labor progression and SVD.  Julianne Handler, CNM 3:57 PM
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
## 2018-09-10 NOTE — Progress Notes (Signed)
OB/GYN Faculty Practice: Labor Progress Note  Subjective: Patient doing well without complaints. No CP, SOB, RUQ pain, vision changes, headaches.  Objective: BP (!) 142/91   Pulse (!) 102   Temp 98.2 F (36.8 C) (Oral)   Resp 17   Ht 5\' 8"  (1.727 m)   Wt 111.6 kg   LMP  (LMP Unknown)   BMI 37.40 kg/m  Gen: well appearing, NAD Dilation: 5 Effacement (%): 60 Cervical Position: Posterior Station: -3 Presentation: Vertex Exam by:: Dr Everardo Pacific: baseline 150bpm, mod variability, +accels, no decels Toco: q 1-2 minutes  Assessment and Plan: 33 y.o. K0U5427 [redacted]w[redacted]d here for IOL-cHTN.  Labor: progressing well, s/p AROM performed with clear fluid @2030 . Pit @22 . RN called to have internals placed given difficulty tracing baby/mom. FSE/IUPC placed 2200, pt tolerated procedure well. Risks/benefits discussed. Will continue time appropriate cervical exams. 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  Fetal Well-Being: EFW 2806g (67%ile) at 35w u/s. Cephalic by cervical exam.  -- Category 1 - continuous fetal monitoring  -- GBS positive- adequate ppx with PCN   Corliss Blacker, PGY-III Family Medicine 10:09 PM

## 2018-09-10 NOTE — Progress Notes (Signed)
Pt up to BR

## 2018-09-11 ENCOUNTER — Encounter (HOSPITAL_COMMUNITY): Payer: Self-pay | Admitting: Anesthesiology

## 2018-09-11 ENCOUNTER — Inpatient Hospital Stay (HOSPITAL_COMMUNITY): Payer: Medicaid Other | Admitting: Anesthesiology

## 2018-09-11 ENCOUNTER — Encounter (HOSPITAL_COMMUNITY): Payer: Medicaid Other

## 2018-09-11 ENCOUNTER — Telehealth: Payer: Medicaid Other | Admitting: Obstetrics

## 2018-09-11 DIAGNOSIS — O1002 Pre-existing essential hypertension complicating childbirth: Secondary | ICD-10-CM

## 2018-09-11 DIAGNOSIS — Z3A38 38 weeks gestation of pregnancy: Secondary | ICD-10-CM

## 2018-09-11 DIAGNOSIS — O99824 Streptococcus B carrier state complicating childbirth: Secondary | ICD-10-CM

## 2018-09-11 LAB — CBC
HCT: 31.6 % — ABNORMAL LOW (ref 36.0–46.0)
Hemoglobin: 9.5 g/dL — ABNORMAL LOW (ref 12.0–15.0)
MCH: 19.8 pg — ABNORMAL LOW (ref 26.0–34.0)
MCHC: 30.1 g/dL (ref 30.0–36.0)
MCV: 65.8 fL — ABNORMAL LOW (ref 80.0–100.0)
Platelets: 174 10*3/uL (ref 150–400)
RBC: 4.8 MIL/uL (ref 3.87–5.11)
RDW: 18.2 % — ABNORMAL HIGH (ref 11.5–15.5)
WBC: 8.6 10*3/uL (ref 4.0–10.5)
nRBC: 0 % (ref 0.0–0.2)

## 2018-09-11 LAB — RPR: RPR Ser Ql: NONREACTIVE

## 2018-09-11 MED ORDER — DIPHENHYDRAMINE HCL 25 MG PO CAPS: 25.0000 mg | ORAL_CAPSULE | Freq: Four times a day (QID) | ORAL | Status: DC | PRN

## 2018-09-11 MED ORDER — IBUPROFEN 600 MG PO TABS
600.0000 mg | ORAL_TABLET | Freq: Four times a day (QID) | ORAL | Status: DC
  Administered 2018-09-11 – 2018-09-12 (×6): 600 mg via ORAL
  Filled 2018-09-11 (×6): qty 1

## 2018-09-11 MED ORDER — DIBUCAINE (PERIANAL) 1 % EX OINT: 1.0000 "application " | TOPICAL_OINTMENT | CUTANEOUS | Status: DC | PRN

## 2018-09-11 MED ORDER — LIDOCAINE HCL (PF) 1 % IJ SOLN
INTRAMUSCULAR | Status: DC | PRN
  Administered 2018-09-11 (×2): 4 mL via EPIDURAL

## 2018-09-11 MED ORDER — COCONUT OIL OIL: 1.0000 "application " | TOPICAL_OIL | Status: DC | PRN

## 2018-09-11 MED ORDER — ENALAPRIL MALEATE 5 MG PO TABS
10.0000 mg | ORAL_TABLET | Freq: Every day | ORAL | Status: DC
  Administered 2018-09-11 – 2018-09-12 (×2): 10 mg via ORAL
  Filled 2018-09-11 (×3): qty 2

## 2018-09-11 MED ORDER — ONDANSETRON HCL 4 MG PO TABS: 4.0000 mg | ORAL_TABLET | ORAL | Status: DC | PRN

## 2018-09-11 MED ORDER — BENZOCAINE-MENTHOL 20-0.5 % EX AERO
1.0000 "application " | INHALATION_SPRAY | CUTANEOUS | Status: DC | PRN
  Administered 2018-09-11: 1 via TOPICAL
  Filled 2018-09-11: qty 56

## 2018-09-11 MED ORDER — WITCH HAZEL-GLYCERIN EX PADS: 1.0000 "application " | MEDICATED_PAD | CUTANEOUS | Status: DC | PRN

## 2018-09-11 MED ORDER — SENNOSIDES-DOCUSATE SODIUM 8.6-50 MG PO TABS
2.0000 | ORAL_TABLET | ORAL | Status: DC
  Administered 2018-09-11: 2 via ORAL
  Filled 2018-09-11: qty 2

## 2018-09-11 MED ORDER — PRENATAL MULTIVITAMIN CH
1.0000 | ORAL_TABLET | Freq: Every day | ORAL | Status: DC
  Administered 2018-09-11 – 2018-09-12 (×2): 1 via ORAL
  Filled 2018-09-11 (×2): qty 1

## 2018-09-11 MED ORDER — TETANUS-DIPHTH-ACELL PERTUSSIS 5-2.5-18.5 LF-MCG/0.5 IM SUSP: 0.5000 mL | Freq: Once | INTRAMUSCULAR | Status: DC

## 2018-09-11 MED ORDER — ACETAMINOPHEN 325 MG PO TABS
650.0000 mg | ORAL_TABLET | ORAL | Status: DC | PRN
  Administered 2018-09-11 (×3): 650 mg via ORAL
  Filled 2018-09-11 (×3): qty 2

## 2018-09-11 MED ORDER — SIMETHICONE 80 MG PO CHEW: 80.0000 mg | CHEWABLE_TABLET | ORAL | Status: DC | PRN

## 2018-09-11 MED ORDER — ONDANSETRON HCL 4 MG/2ML IJ SOLN: 4.0000 mg | INTRAMUSCULAR | Status: DC | PRN

## 2018-09-11 MED ORDER — SODIUM CHLORIDE (PF) 0.9 % IJ SOLN
INTRAMUSCULAR | Status: DC | PRN
  Administered 2018-09-11: 12 mL/h via EPIDURAL

## 2018-09-11 NOTE — Progress Notes (Signed)
CSW acknowledges consult and attempted to meet with [name], however [name] was in the restroom. CSW agreed to come back at a later time.   Abundio Miu, [place] Worker [facility] Cell#: [phone]

## 2018-09-11 NOTE — Anesthesia Postprocedure Evaluation (Signed)
Anesthesia Post Note  Patient: Danielle [name]  Procedure(s) Performed: AN AD [place]     Patient location during evaluation: Mother Baby Anesthesia Type: Epidural Level of consciousness: awake and alert and oriented Pain management: satisfactory to patient Vital Signs Assessment: post-procedure vital signs reviewed and stable Respiratory status: spontaneous breathing and nonlabored ventilation Cardiovascular status: stable Postop Assessment: no headache, no backache, no signs of nausea or vomiting, adequate PO intake, patient able to bend at knees and able to ambulate (patient up walking) Anesthetic complications: no    Last Vitals:  Vitals:   09/11/18 0415 09/11/18 0514  BP: 125/65 125/68  Pulse: 75 83  Resp: 20 20  Temp: 37 C 37.1 C    Last Pain:  Vitals:   09/11/18 0514  TempSrc: Oral  PainSc: 0-No pain   Pain Goal:                   Lyndel Sarate

## 2018-09-11 NOTE — Anesthesia Preprocedure Evaluation (Signed)
Anesthesia Evaluation  Patient identified by MRN, date of birth, ID band Patient awake    Reviewed: Allergy & Precautions, Patient's Chart, lab work & pertinent test results, reviewed documented beta blocker date and time   Airway Mallampati: II  TM Distance: >3 FB Neck ROM: Full    Dental no notable dental hx. (+) Teeth Intact   Pulmonary neg pulmonary ROS,    Pulmonary exam normal breath sounds clear to auscultation       Cardiovascular hypertension, Pt. on medications and Pt. on home beta blockers Normal cardiovascular exam Rhythm:Regular Rate:Normal     Neuro/Psych  Headaches, PSYCHIATRIC DISORDERS Anxiety Depression PTSD   GI/Hepatic Neg liver ROS, GERD  ,  Endo/Other  Hypothyroidism Obesity  Renal/GU negative Renal ROS  negative genitourinary   Musculoskeletal negative musculoskeletal ROS (+)   Abdominal (+) + obese,   Peds  Hematology  (+) anemia ,   Anesthesia Other Findings   Reproductive/Obstetrics (+) Pregnancy HSV Pre eclampsia                             Anesthesia Physical Anesthesia Plan  ASA: III  Anesthesia Plan: Epidural   Post-op Pain Management:    Induction:   PONV Risk Score and Plan:   Airway Management Planned: Natural Airway  Additional Equipment:   Intra-op Plan:   Post-operative Plan:   Informed Consent: I have reviewed the patients History and Physical, chart, labs and discussed the procedure including the risks, benefits and alternatives for the proposed anesthesia with the patient or authorized representative who has indicated his/her understanding and acceptance.       Plan Discussed with: Anesthesiologist  Anesthesia Plan Comments:         Anesthesia Quick Evaluation

## 2018-09-11 NOTE — Discharge Summary (Signed)
Postpartum Discharge Summary     Patient Name: Annalena Piatt DOB: [birth year] MRN: [id number]  Date of admission: 09/10/2018 Delivering Provider: Arrie Senate   Date of discharge: 09/12/2018  Admitting diagnosis: pregnancy Intrauterine pregnancy: [redacted]w[redacted]d     Secondary diagnosis:  Principal Problem:   Chronic hypertension with superimposed preeclampsia Active Problems:   Supervision of high risk pregnancy, antepartum   Labor without complication   VBAC, delivered  Additional problems: none     Discharge diagnosis: Term Pregnancy Delivered and CHTN with preeclampsia                                                                Post partum procedures:none  Augmentation: AROM and Pitocin  Complications: None  Hospital course:  Induction of Labor With Vaginal Delivery   33 y.o. yo W7P7106 at [redacted]w[redacted]d was admitted to the hospital 09/10/2018 for induction of labor.  Indication for induction: chronic hypertension with superimposed preeclampsia (no severe features).  Patient had an uncomplicated labor course as follows: Pitocin started and increased to get labor going Pushed well to SVD. Membrane Rupture Time/Date: 8:35 PM ,09/10/2018   Intrapartum Procedures: Episiotomy: None [1]                                         Lacerations:  None [1]  Patient had delivery of a Viable infant.  Information for the patient's newborn:  Nikeia, Henkes [[id number]]  Delivery Method: Vaginal, Spontaneous(Filed from Delivery Summary)   09/11/2018  Details of delivery can be found in separate delivery note.  BP was managed with by switching from Labetalol to Vasotec.  Otherwise, patient had a routine postpartum course. Patient is discharged home 09/12/18.  Magnesium Sulfate recieved: No BMZ received: No  Physical exam  Vitals:   09/11/18 0932 09/11/18 1205 09/11/18 1540 09/11/18 2135  BP: (!) 114/50 113/69 127/73 108/62  Pulse: 77 85 90 91  Resp:  20 18 18   Temp:   98.8 F (37.1 C) 99.1 F  (37.3 C)  TempSrc:   Oral Oral  SpO2:  100% 100% 96%  Weight:      Height:       General: alert, cooperative and no distress Lochia: appropriate Uterine Fundus: firm Incision: N/A DVT Evaluation: No evidence of DVT seen on physical exam. Labs: Lab Results  Component Value Date   WBC 8.6 09/10/2018   HGB 9.5 (L) 09/10/2018   HCT 31.6 (L) 09/10/2018   MCV 65.8 (L) 09/10/2018   PLT 174 09/10/2018   CMP Latest Ref Rng & Units 09/10/2018  Glucose 70 - 99 mg/dL 99  BUN 6 - 20 mg/dL 5(L)  Creatinine 0.44 - 1.00 mg/dL 0.51  Sodium 135 - 145 mmol/L 138  Potassium 3.5 - 5.1 mmol/L 3.5  Chloride 98 - 111 mmol/L 108  CO2 22 - 32 mmol/L 19(L)  Calcium 8.9 - 10.3 mg/dL 8.8(L)  Total Protein 6.5 - 8.1 g/dL 5.8(L)  Total Bilirubin 0.3 - 1.2 mg/dL 0.5  Alkaline Phos 38 - 126 U/L 109  AST 15 - 41 U/L 15  ALT 0 - 44 U/L 8    Discharge instruction: per After Visit Summary  and "Baby and Me Booklet".  After visit meds:  Allergies as of 09/12/2018      Reactions   Latex Hives, Rash      Medication List    STOP taking these medications   labetalol 100 MG tablet Commonly known as: NORMODYNE   valACYclovir 1000 MG tablet Commonly known as: VALTREX     TAKE these medications   aspirin 81 MG chewable tablet Chew 1 tablet (81 mg total) by mouth daily.   butalbital-acetaminophen-caffeine 50-325-40 MG tablet Commonly known as: FIORICET Take 2 tablets by mouth every 6 (six) hours as needed for headache.   docusate sodium 100 MG capsule Commonly known as: Colace Take 1 capsule (100 mg total) by mouth 2 (two) times daily as needed for mild constipation or moderate constipation. What changed:   when to take this  reasons to take this   enalapril 10 MG tablet Commonly known as: VASOTEC Take 1 tablet (10 mg total) by mouth daily.   ibuprofen 600 MG tablet Commonly known as: ADVIL Take 1 tablet (600 mg total) by mouth every 6 (six) hours as needed for mild pain, moderate pain or  cramping.   omeprazole 20 MG tablet Commonly known as: PriLOSEC OTC Take 1 tablet (20 mg total) by mouth daily.   PNV PO Take by mouth.   polyethylene glycol 17 g packet Commonly known as: MiraLax Take 17 g by mouth 2 (two) times daily.   senna-docusate 8.6-50 MG tablet Commonly known as: Senokot-S Take 2 tablets by mouth at bedtime as needed for mild constipation or moderate constipation.       Diet: routine diet  Activity: Advance as tolerated. Pelvic rest for 6 weeks.   Outpatient follow up:1 week bp check Follow up Appt: Future Appointments  Date Time Provider Department Center  09/18/2018  1:15 PM [facility] NURSE [facility] None  10/09/2018  2:15 PM Constant, Gigi GinPeggy, MD [facility] None   Follow up Visit: Follow-up Information    [facility] Follow up in 1 week(s).   Specialty: Obstetrics and Gynecology Why: BP check and then postpartum follow up Contact information: [address], [address] [place] [id number] [phone]         Please schedule this patient for Postpartum visit in: 1 week with the following provider: Any provider For C/S patients schedule nurse incision check in weeks 2 weeks: no High risk pregnancy complicated by: HTN Delivery mode:  SVD Anticipated Birth Control:  Plans Interval BTL, received depo bridge prior to discharge PP Procedures needed: BP check, currently on enalapril Schedule Integrated BH visit: no   Newborn Data: Live born female  Birth Weight: 7 lb 5.6 oz (3334 g) APGAR: 8, 9  Newborn Delivery   Birth date/time: 09/11/2018 02:25:00 Delivery type: Vaginal, Spontaneous      Baby Feeding: Breast Disposition:home with mother   09/12/2018 Jaynie CollinsUgonna , MD

## 2018-09-11 NOTE — Clinical Social Work Maternal (Signed)
CLINICAL SOCIAL WORK MATERNAL/CHILD NOTE  Patient Details  Name: Danielle [name] MRN: [id number] Date of Birth: [birth year]  Date:  09/11/2018  Clinical Social Worker Initiating Note:  Abundio Miu, [place] Date/Time: Initiated:  09/11/18/1203     Child's Name:  Danielle [name]   Biological Parents:  Mother, Father(Father: Danielle [name] 6/16/year unknown)   Need for Interpreter:  None   Reason for Referral:  Behavioral Health Concerns, Current CPS Involvement   Address:  [place] [address]    Phone number:  [phone] (home)     Additional phone number:   Household Members/Support Persons (HM/SP):       HM/SP Name Relationship DOB or Age  HM/SP -1        HM/SP -2        HM/SP -3        HM/SP -4        HM/SP -5        HM/SP -6        HM/SP -7        HM/SP -8          Natural Supports (not living in the home):  Immediate Family, Extended Family   Professional Supports: Therapist   Employment: Full-time   Type of Work: Scientist, water quality at [employer]   Education:  [place] arranged:    Museum/gallery curator Resources:  [employer]   Other Resources:  [employer], Physicist, medical    Cultural/Religious Considerations Which May Impact Care:    Strengths:  Ability to meet basic needs , Home prepared for child , Engineer, materials, Psychotropic Medications   Psychotropic Medications:  Abilify      Pediatrician:    [place]  Pediatrician List:   [facility]      Pediatrician Fax Number:    Risk Factors/Current Problems:  DHHS Involvement    Cognitive State:  Able to Concentrate , Alert , Goal Oriented , Insightful , Linear Thinking    Mood/Affect:  Interested , Relaxed , Calm , Tearful    CSW Assessment: CSW met with [name] at bedside to discuss consult for mental health concerns and CPS history. [name] was  sitting up in bed and infant was asleep in basinet. CSW introduced self and explained reason for consult. [name] was welcoming, open and engaged during assessment. [name] reported that she resides alone and works at [employer] as a Scientist, water quality. [name] reported that four of her children Danielle [name] 10[birth year]05-03; Danielle [name] 7-11-16[name] 08-21-16; Danielle [name] 10-29-17) are currently in the care of her grandmother Danielle [name]) due to CPS involvement. [name] reported that her son Danielle [name] 08-19-13) resides with her ex-wife and she does not have contact with this child. [name] reported that she has contact with her other four children and stays overnight each weekend at her grandmother's to spend time with the children. [name] reported that she currently has an open case with [place] and her social worker is Danielle [name]). [name] reported that she is working towards reunification and has a court date on 09/24/18 which has been pushed back twice due to [place] [address]. 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 [name] became tearful when speaking  about her CPS case, CSW acknowledged and validated [name]'s emotions. [name] reported that her mother spoke with her CPS worker about infant and that CPS reported that everything should be fine as Danielle [name] as the baby is negative. CSW informed [name] about hospital drug policy due to substance use history during pregnancy. [name] reported that she did not know she was pregnancy when she used marijuana in November. [name] denied any additional substance use during pregnancy. CSW informed [name] that UDS and CDS would be monitored and CPS would be notified. [name] verbalized understanding. CSW explained that a CPS report would be made due to [name]'s CPS history and having an open case. [name] reported that she has all items needed to care  for infant including a car seat and crib. CSW inquired about [name]'s support system, [name] reported that her whole family is supportive. [name] reported that her ACT Team with [facility] is also supportive. [name] reported that FOB will not be involved.   CSW inquired about [name]'s mental health history, [name] reported that she was diagnosed with PTSD in 2010, Anxiety in 2007 and unspecified mood disorder in 2019. [name] reported that she is currently taking Abilify which is effective in treating her mental health symptoms. [name] denied any current symptoms of PTSD and unspecified mood disorder. [name] endorsed anxiety symptoms and described them as tightening of chest, sweaty palms and mini panic attacks. [name] reported that she doesn't have panic attacks often and the last one was a month ago. CSW inquired about [name]'s triggers for panic attacks, [name] reported CPS involvement (phone calls and letters) and the uncertainty of her CPS case. CSW acknowledged [name]'s triggers and inquired about coping skills. [name] reported that music, breathing, cool showers and exercise are her coping skills after having a panic attack. CSW positively affirmed [name]'s healthy coping skills. [name] reported that she had been hospitalized multiple times in her early twenties and received different diagnosis and no treatment plans each time so she didn't acknowledge her mental health. [name] reported that she now acknowledges her mental health and knows that she has to stay on top of her mental health to care for her children. [name] reported that she is receiving therapy at [facility] and her ACT Team calls or comes by twice a week. CSW praised [name] for seeking mental health treatment. [name] possessed good insight on her mental health and did not demonstrate any acute mental health signs/symptoms. CSW assessed for safety, [name] denied SI, HI and domestic violence. CSW informed [name] that due to her mental health history she is [name] susceptible to  postpartum depression and postpartum psychosis due to having it after her last pregnancy.   CSW provided education regarding the baby blues period vs. perinatal mood disorders, discussed treatment and gave resources for mental health follow up if concerns arise.  CSW recommends self-evaluation during the postpartum time period using the New Mom Checklist from Postpartum Progress and encouraged [name] to contact a medical professional if symptoms are noted at any time.    CSW provided review of Sudden Infant Death Syndrome (SIDS) precautions. [name] verbalized understanding and reported that infant would sleep in the crib.   CSW made a [facility] CPS report due to [name]'s current CPS history. CPS report accepted and CPS workers (Nina Rucker and [place]) came to assess [name] at the hospital. CSW awaiting discharge plan from [place] workers. Currently there are barriers to infant discharging home with [name].   CSW Plan/Description:  Sudden Infant Death Syndrome (  SIDS) Education, Perinatal Mood and Anxiety Disorder (PMADs) Education, [place], CSW Will Continue to Monitor Umbilical Cord Tissue Drug Screen Results and Make Report if Warranted, Child Protective Service Report , CSW Awaiting CPS Disposition [place], LCSW 09/11/2018, 1:54 PM

## 2018-09-11 NOTE — Anesthesia Procedure Notes (Signed)
Epidural Patient location during procedure: OB Start time: 09/11/2018 12:21 AM End time: 09/11/2018 12:29 AM  Staffing Anesthesiologist: Josephine Igo, MD Performed: anesthesiologist   Preanesthetic Checklist Completed: patient identified, site marked, surgical consent, pre-op evaluation, timeout performed, IV checked, risks and benefits discussed and monitors and equipment checked  Epidural Patient position: sitting Prep: site prepped and draped and DuraPrep Patient monitoring: continuous pulse ox and blood pressure Approach: midline Location: L3-L4 Injection technique: LOR air  Needle:  Needle type: Tuohy  Needle gauge: 17 G Needle length: 9 cm and 9 Needle insertion depth: 8 cm Catheter type: closed end flexible Catheter size: 19 Gauge Catheter at skin depth: 13 cm Test dose: negative and Other  Assessment Events: blood not aspirated, injection not painful, no injection resistance, negative IV test and no paresthesia  Additional Notes Patient identified. Risks and benefits discussed including failed block, incomplete  Pain control, post dural puncture headache, nerve damage, paralysis, blood pressure Changes, nausea, vomiting, reactions to medications-both toxic and allergic and post Partum back pain. All questions were answered. Patient expressed understanding and wished to proceed. Sterile technique was used throughout procedure. Epidural site was Dressed with sterile barrier dressing. No paresthesias, signs of intravascular injection Or signs of intrathecal spread were encountered.  Patient was more comfortable after the epidural was dosed. Please see RN's note for documentation of vital signs and FHR which are stable. Reason for block:procedure for pain

## 2018-09-11 NOTE — Lactation Note (Signed)
This note was copied from a baby's chart. Lactation Consultation Note  Patient Name: Danielle [name] [id number] Date: 09/11/2018 Reason for consult: Initial assessment;Early term 37-38.6wks  4982 - 1713 - I visited Ms. Maltz to conduct an initial breast feeding consultation. She states that she breast fed each of her previous 5 children for differing duration. She breast fed her 4 year for the first 18 months. Ms. Graff has shirt intervals between pregnancies with several of her children which has caused early cessation of breast feeding. She has a 76 month old at home and now has five boys under the age of six (six children in total).  Ms. Mcfall states that she is "very fertile" and she plans to have a tubal ligation at the hospital.  She has been breast feeding her son, Gustavo Lah, since delivery. We discussed feeding frequency, feeding duration and output expectations. Mom states that she feels comfortable latching him and feels that he's latching well. She denies breast pain with latch. Her feeding record indicates one five hour interval between feedings. I recommended that she feed 8-12 times a day with cues, waking to feed if interval exceeds four hours.   Ms. Moffit had a DEBP set up at the bedside. She states that she requested the DEBP for extra stimulation. I educated on how hand expression may yield more colostrum for supplementation at this time and how pumping may help with breast stimulation.  Ms. Coke has [facility]. She has a manual pump at home and she now has an additional "piston" manual pump with her DEBP kit.   I was unable to observe Ms. Campton feed, but she states that she does not need breast feeding assistance at this time. I shared our community breast feeding resources.   Maternal Data Formula Feeding for Exclusion: No Has patient been taught Hand Expression?: Yes Does the patient have breastfeeding experience prior to this delivery?:  Yes  Feeding Feeding Type: Breast Fed   Interventions Interventions: Breast feeding basics reviewed  Lactation Tools Discussed/Used Tools: Other (comment)(spoon) WIC Program: Yes   Consult Status Consult Status: Follow-up Date: 09/12/18 Follow-up type: In-patient    Lenore Manner 09/11/2018, 6:06 PM

## 2018-09-12 DIAGNOSIS — O34219 Maternal care for unspecified type scar from previous cesarean delivery: Secondary | ICD-10-CM | POA: Diagnosis not present

## 2018-09-12 MED ORDER — SENNOSIDES-DOCUSATE SODIUM 8.6-50 MG PO TABS: 2.0000 | ORAL_TABLET | Freq: Every evening | ORAL | 1 refills | Status: DC | PRN

## 2018-09-12 MED ORDER — ENALAPRIL MALEATE 10 MG PO TABS: 10.0000 mg | ORAL_TABLET | Freq: Every day | ORAL | 2 refills | Status: DC

## 2018-09-12 MED ORDER — DOCUSATE SODIUM 100 MG PO CAPS: 100.0000 mg | ORAL_CAPSULE | Freq: Two times a day (BID) | ORAL | 5 refills | Status: DC | PRN

## 2018-09-12 MED ORDER — IBUPROFEN 600 MG PO TABS: 600.0000 mg | ORAL_TABLET | Freq: Four times a day (QID) | ORAL | 2 refills | Status: DC | PRN

## 2018-09-12 MED ORDER — MEDROXYPROGESTERONE ACETATE 150 MG/ML IM SUSP
150.0000 mg | Freq: Once | INTRAMUSCULAR | Status: AC
  Administered 2018-09-12: 150 mg via INTRAMUSCULAR
  Filled 2018-09-12: qty 1

## 2018-09-12 MED ORDER — ENALAPRIL MALEATE 10 MG PO TABS: 10.0000 mg | ORAL_TABLET | Freq: Every day | ORAL | 3 refills | Status: DC

## 2018-09-12 MED ORDER — SENNOSIDES-DOCUSATE SODIUM 8.6-50 MG PO TABS: 2.0000 | ORAL_TABLET | Freq: Every evening | ORAL | 2 refills | Status: DC | PRN

## 2018-09-12 MED ORDER — IBUPROFEN 600 MG PO TABS: 600.0000 mg | ORAL_TABLET | Freq: Four times a day (QID) | ORAL | 0 refills | Status: DC

## 2018-09-12 MED FILL — ENALAPRIL MALEATE 10 MG TAB: 10 | 30 days supply | Qty: 30 | Fill #0

## 2018-09-12 MED FILL — SENNA S 8.6-50 MG TABS: 8.6-50 | 15 days supply | Qty: 30 | Fill #0

## 2018-09-12 MED FILL — IBUPROFEN 600 MG TABLET: 600 | 30 days supply | Qty: 30 | Fill #0

## 2018-09-12 NOTE — Progress Notes (Signed)
CSW attended [place] [facility] CPS CFT meeting with [name]. CPS SW Kristen Sutherland, CPS Supervisor Craig Benton, CPS Facilitator Harry Lucas Jr. and Family Support Person Leslie Ross was present at CFT meeting. Additional [place] [facility] CPS staff, additional family supports and attorneys were on the phone during meeting. CPS SW reported that there are no barriers to infant discharging home with [name]. CPS requested to be notified when infant is discharged and verbalized plan to follow up in the community.  [name] denied any additional needs/concerns. Infant to discharge home with [name], no barriers.    Danielle Barros, LCSW Clinical Social Worker [facility] Cell#: [phone] 

## 2018-09-12 NOTE — Discharge Instructions (Signed)
Postpartum Care After Vaginal Delivery °This sheet gives you information about how to care for yourself from the time you deliver your baby to up to 6-12 weeks after delivery (postpartum period). Your health care provider may also give you more specific instructions. If you have problems or questions, contact your health care provider. °Follow these instructions at home: °Vaginal bleeding °· It is normal to have vaginal bleeding (lochia) after delivery. Wear a sanitary pad for vaginal bleeding and discharge. °? During the first week after delivery, the amount and appearance of lochia is often similar to a menstrual period. °? Over the next few weeks, it will gradually decrease to a dry, yellow-brown discharge. °? For most women, lochia stops completely by 4-6 weeks after delivery. Vaginal bleeding can vary from woman to woman. °· Change your sanitary pads frequently. Watch for any changes in your flow, such as: °? A sudden increase in volume. °? A change in color. °? Large blood clots. °· If you pass a blood clot from your vagina, save it and call your health care provider to discuss. Do not flush blood clots down the toilet before talking with your health care provider. °· Do not use tampons or douches until your health care provider says this is safe. °· If you are not breastfeeding, your period should return 6-8 weeks after delivery. If you are feeding your child breast milk only (exclusive breastfeeding), your period may not return until you stop breastfeeding. °Perineal care °· Keep the area between the vagina and the anus (perineum) clean and dry as told by your health care provider. Use medicated pads and pain-relieving sprays and creams as directed. °· If you had a cut in the perineum (episiotomy) or a tear in the vagina, check the area for signs of infection until you are healed. Check for: °? More redness, swelling, or pain. °? Fluid or blood coming from the cut or tear. °? Warmth. °? Pus or a bad  smell. °· You may be given a squirt bottle to use instead of wiping to clean the perineum area after you go to the bathroom. As you start healing, you may use the squirt bottle before wiping yourself. Make sure to wipe gently. °· To relieve pain caused by an episiotomy, a tear in the vagina, or swollen veins in the anus (hemorrhoids), try taking a warm sitz bath 2-3 times a day. A sitz bath is a warm water bath that is taken while you are sitting down. The water should only come up to your hips and should cover your buttocks. °Breast care °· Within the first few days after delivery, your breasts may feel heavy, full, and uncomfortable (breast engorgement). Milk may also leak from your breasts. Your health care provider can suggest ways to help relieve the discomfort. Breast engorgement should go away within a few days. °· If you are breastfeeding: °? Wear a bra that supports your breasts and fits you well. °? Keep your nipples clean and dry. Apply creams and ointments as told by your health care provider. °? You may need to use breast pads to absorb milk that leaks from your breasts. °? You may have uterine contractions every time you breastfeed for up to several weeks after delivery. Uterine contractions help your uterus return to its normal size. °? If you have any problems with breastfeeding, work with your health care provider or lactation consultant. °· If you are not breastfeeding: °? Avoid touching your breasts a lot. Doing this can make   your breasts produce more milk. °? Wear a good-fitting bra and use cold packs to help with swelling. °? Do not squeeze out (express) milk. This causes you to make more milk. °Intimacy and sexuality °· Ask your health care provider when you can engage in sexual activity. This may depend on: °? Your risk of infection. °? How fast you are healing. °? Your comfort and desire to engage in sexual activity. °· You are able to get pregnant after delivery, even if you have not had  your period. If desired, talk with your health care provider about methods of birth control (contraception). °Medicines °· Take over-the-counter and prescription medicines only as told by your health care provider. °· If you were prescribed an antibiotic medicine, take it as told by your health care provider. Do not stop taking the antibiotic even if you start to feel better. °Activity °· Gradually return to your normal activities as told by your health care provider. Ask your health care provider what activities are safe for you. °· Rest as much as possible. Try to rest or take a nap while your baby is sleeping. °Eating and drinking ° °· Drink enough fluid to keep your urine pale yellow. °· Eat high-fiber foods every day. These may help prevent or relieve constipation. High-fiber foods include: °? Whole grain cereals and breads. °? Brown rice. °? Beans. °? Fresh fruits and vegetables. °· Do not try to lose weight quickly by cutting back on calories. °· Take your prenatal vitamins until your postpartum checkup or until your health care provider tells you it is okay to stop. °Lifestyle °· Do not use any products that contain nicotine or tobacco, such as cigarettes and e-cigarettes. If you need help quitting, ask your health care provider. °· Do not drink alcohol, especially if you are breastfeeding. °General instructions °· Keep all follow-up visits for you and your baby as told by your health care provider. Most women visit their health care provider for a postpartum checkup within the first 3-6 weeks after delivery. °Contact a health care provider if: °· You feel unable to cope with the changes that your child brings to your life, and these feelings do not go away. °· You feel unusually sad or worried. °· Your breasts become red, painful, or hard. °· You have a fever. °· You have trouble holding urine or keeping urine from leaking. °· You have little or no interest in activities you used to enjoy. °· You have not  breastfed at all and you have not had a menstrual period for 12 weeks after delivery. °· You have stopped breastfeeding and you have not had a menstrual period for 12 weeks after you stopped breastfeeding. °· You have questions about caring for yourself or your baby. °· You pass a blood clot from your vagina. °Get help right away if: °· You have chest pain. °· You have difficulty breathing. °· You have sudden, severe leg pain. °· You have severe pain or cramping in your lower abdomen. °· You bleed from your vagina so much that you fill more than one sanitary pad in one hour. Bleeding should not be heavier than your heaviest period. °· You develop a severe headache. °· You faint. °· You have blurred vision or spots in your vision. °· You have bad-smelling vaginal discharge. °· You have thoughts about hurting yourself or your baby. °If you ever feel like you may hurt yourself or others, or have thoughts about taking your own life, get help   right away. You can go to the nearest emergency department or call: °· Your local emergency services (911 in the [place] °· A suicide crisis helpline, such as the National Suicide Prevention Lifeline at 1-800-273-8255. This is open 24 hours a day. °Summary °· The period of time right after you deliver your newborn up to 6-12 weeks after delivery is called the postpartum period. °· Gradually return to your normal activities as told by your health care provider. °· Keep all follow-up visits for you and your baby as told by your health care provider. °This information is not intended to replace advice given to you by your health care provider. Make sure you discuss any questions you have with your health care provider. °Document Released: 11/19/2006 Document Revised: 01/25/2017 Document Reviewed: 11/05/2016 °Elsevier Patient Education © 2020 [employer] ° ° ° °Postpartum Hypertension °Postpartum hypertension is high blood pressure that remains higher than normal after childbirth. You  may not realize that you have postpartum hypertension if your blood pressure is not being checked regularly. In most cases, postpartum hypertension will go away on its own, usually within a week of delivery. However, for some women, medical treatment is required to prevent serious complications, such as seizures or stroke. °What are the causes? °This condition may be caused by one or more of the following: °· Hypertension that existed before pregnancy (chronic hypertension). °· Hypertension that comes on as a result of pregnancy (gestational hypertension). °· Hypertensive disorders during pregnancy (preeclampsia) or seizures in women who have high blood pressure during pregnancy (eclampsia). °· A condition in which the liver, platelets, and red blood cells are damaged during pregnancy (HELLP syndrome). °· A condition in which the thyroid produces too much hormones (hyperthyroidism). °· Other rare problems of the nerves (neurological disorders) or blood disorders. °In some cases, the cause may not be known. °What increases the risk? °The following factors may make you more likely to develop this condition: °· Chronic hypertension. In some cases, this may not have been diagnosed before pregnancy. °· Obesity. °· Type 2 diabetes. °· Kidney disease. °· History of preeclampsia or eclampsia. °· Other medical conditions that change the level of hormones in the body (hormonal imbalance). °What are the signs or symptoms? °As with all types of hypertension, postpartum hypertension may not have any symptoms. Depending on how high your blood pressure is, you may experience: °· Headaches. These may be mild, moderate, or severe. They may also be steady, constant, or sudden in onset (thunderclap headache). °· Changes in your ability to see (visual changes). °· Dizziness. °· Shortness of breath. °· Swelling of your hands, feet, lower legs, or face. In some cases, you may have swelling in more than one of these locations. °· Heart  palpitations or a racing heartbeat. °· Difficulty breathing while lying down. °· Decrease in the amount of urine that you pass. °Other rare signs and symptoms may include: °· Sweating more than usual. This lasts longer than a few days after delivery. °· Chest pain. °· Sudden dizziness when you get up from sitting or lying down. °· Seizures. °· Nausea or vomiting. °· Abdominal pain. °How is this diagnosed? °This condition may be diagnosed based on the results of a physical exam, blood pressure measurements, and blood and urine tests. °You may also have other tests, such as a CT scan or an MRI, to check for other problems of postpartum hypertension. °How is this treated? °If blood pressure is high enough to require treatment, your options may include: °·   Medicines to reduce blood pressure (antihypertensives). Tell your health care provider if you are breastfeeding or if you plan to breastfeed. There are many antihypertensive medicines that are safe to take while breastfeeding. °· Stopping medicines that may be causing hypertension. °· Treating medical conditions that are causing hypertension. °· Treating the complications of hypertension, such as seizures, stroke, or kidney problems. °Your health care provider will also continue to monitor your blood pressure closely until it is within a safe range for you. °Follow these instructions at home: °· Take over-the-counter and prescription medicines only as told by your health care provider. °· Return to your normal activities as told by your health care provider. Ask your health care provider what activities are safe for you. °· Do not use any products that contain nicotine or tobacco, such as cigarettes and e-cigarettes. If you need help quitting, ask your health care provider. °· Keep all follow-up visits as told by your health care provider. This is important. °Contact a health care provider if: °· Your symptoms get worse. °· You have new symptoms, such as: °? A  headache that does not get better. °? Dizziness. °? Visual changes. °Get help right away if: °· You suddenly develop swelling in your hands, ankles, or face. °· You have sudden, rapid weight gain. °· You develop difficulty breathing, chest pain, racing heartbeat, or heart palpitations. °· You develop severe pain in your abdomen. °· You have any symptoms of a stroke. "BE FAST" is an easy way to remember the main warning signs of a stroke: °? B - Balance. Signs are dizziness, sudden trouble walking, or loss of balance. °? E - Eyes. Signs are trouble seeing or a sudden change in vision. °? F - Face. Signs are sudden weakness or numbness of the face, or the face or eyelid drooping on one side. °? A - Arms. Signs are weakness or numbness in an arm. This happens suddenly and usually on one side of the body. °? S - Speech. Signs are sudden trouble speaking, slurred speech, or trouble understanding what people say. °? T - Time. Time to call emergency services. Write down what time symptoms started. °· You have other signs of a stroke, such as: °? A sudden, severe headache with no known cause. °? Nausea or vomiting. °? Seizure. °These symptoms may represent a serious problem that is an emergency. Do not wait to see if the symptoms will go away. Get medical help right away. Call your local emergency services (911 in the [place] Do not drive yourself to the hospital. °Summary °· Postpartum hypertension is high blood pressure that remains higher than normal after childbirth. °· In most cases, postpartum hypertension will go away on its own, usually within a week of delivery. °· For some women, medical treatment is required to prevent serious complications, such as seizures or stroke. °This information is not intended to replace advice given to you by your health care provider. Make sure you discuss any questions you have with your health care provider. °Document Released: 09/25/2013 Document Revised: 02/28/2018 Document  Reviewed: 11/12/2016 °Elsevier Patient Education © 2020 [employer] Inc[employer] ° °

## 2018-09-17 ENCOUNTER — Inpatient Hospital Stay (HOSPITAL_COMMUNITY): Admission: RE | Admit: 2018-09-17 | Payer: Medicaid Other | Source: Home / Self Care

## 2018-09-17 ENCOUNTER — Inpatient Hospital Stay (HOSPITAL_COMMUNITY): Payer: Medicaid Other

## 2018-09-18 ENCOUNTER — Ambulatory Visit: Payer: Medicaid Other

## 2018-09-23 ENCOUNTER — Ambulatory Visit: Payer: Medicaid Other

## 2018-10-09 ENCOUNTER — Other Ambulatory Visit: Payer: Self-pay

## 2018-10-09 ENCOUNTER — Encounter: Payer: Self-pay | Admitting: Obstetrics and Gynecology

## 2018-10-09 ENCOUNTER — Ambulatory Visit (INDEPENDENT_AMBULATORY_CARE_PROVIDER_SITE_OTHER): Payer: Medicaid Other | Admitting: Obstetrics and Gynecology

## 2018-10-09 DIAGNOSIS — Z1389 Encounter for screening for other disorder: Secondary | ICD-10-CM | POA: Diagnosis not present

## 2018-10-09 MED ORDER — ENALAPRIL MALEATE 20 MG PO TABS: 20.0000 mg | ORAL_TABLET | Freq: Every day | ORAL | 3 refills | Status: DC

## 2018-10-09 NOTE — Progress Notes (Signed)
Post Partum Exam  Danielle [name] is a 33 y.o. [id number] female who presents for a postpartum visit. She is 4 weeks postpartum following a spontaneous vaginal delivery. I have fully reviewed the prenatal and intrapartum course. The delivery was at 38.1 gestational weeks.  Anesthesia: epidural. Postpartum course has been having Migraines. Baby's course has been doing well. Baby is feeding by both breast and bottle - Similac Sensitive RS. Bleeding staining only. Bowel function is normal. Bladder function is normal. Patient is not sexually active. Contraception method is Depo.  Postpartum depression screening:neg, score 3.    Last pap smear done 06/2017 and was Normal  Review of Systems Pertinent items are noted in HPI.    Objective:  unknown if currently breastfeeding.  General:  alert, cooperative and no distress   Breasts:  inspection negative, no nipple discharge or bleeding, no masses or nodularity palpable  Lungs: clear to auscultation bilaterally  Heart:  regular rate and rhythm  Abdomen: soft, non-tender; bowel sounds normal; no masses,  no organomegaly   Vulva:  normal  Vagina: normal vagina, no discharge, exudate, lesion, or erythema  Cervix:  multiparous appearance  Corpus: normal size, contour, position, consistency, mobility, non-tender  Adnexa:  normal adnexa and no mass, fullness, tenderness  Rectal Exam:         Assessment:    Normal postpartum exam. Pap smear not done at today's visit.   Plan:   1. Contraception: Depo-Provera injections. Patient desires BTL Other reversible forms of contraception were discussed with patient; she declines all other modalities.  Risks of procedure discussed with patient including permanence of method, bleeding, infection, injury to surrounding organs and need for additional procedures including laparotomy, risk of regret.  Failure risk of 0.5-1% with increased risk of ectopic gestation if pregnancy occurs was also discussed with patient.     2. Patient is medically cleared to resume all activities of daily living 3. Follow up in: 9 months for annual exam or as needed.

## 2018-11-20 ENCOUNTER — Other Ambulatory Visit: Payer: Self-pay

## 2018-11-20 ENCOUNTER — Encounter (HOSPITAL_BASED_OUTPATIENT_CLINIC_OR_DEPARTMENT_OTHER): Payer: Self-pay | Admitting: *Deleted

## 2018-11-22 ENCOUNTER — Other Ambulatory Visit (HOSPITAL_COMMUNITY): Payer: Medicaid Other

## 2018-11-24 ENCOUNTER — Other Ambulatory Visit (HOSPITAL_COMMUNITY)
Admission: RE | Admit: 2018-11-24 | Discharge: 2018-11-24 | Disposition: A | Discharge status: Home / Self Care | Payer: Medicaid Other | Source: Ambulatory Visit | Attending: Obstetrics and Gynecology | Admitting: Obstetrics and Gynecology

## 2018-11-24 ENCOUNTER — Encounter (HOSPITAL_BASED_OUTPATIENT_CLINIC_OR_DEPARTMENT_OTHER)
Admission: RE | Admit: 2018-11-24 | Discharge: 2018-11-24 | Disposition: A | Discharge status: Home / Self Care | Payer: Medicaid Other | Source: Ambulatory Visit | Attending: Obstetrics and Gynecology | Admitting: Obstetrics and Gynecology

## 2018-11-24 ENCOUNTER — Other Ambulatory Visit: Payer: Self-pay

## 2018-11-24 DIAGNOSIS — Z01818 Encounter for other preprocedural examination: Secondary | ICD-10-CM | POA: Diagnosis present

## 2018-11-24 DIAGNOSIS — Z20828 Contact with and (suspected) exposure to other viral communicable diseases: Secondary | ICD-10-CM | POA: Diagnosis not present

## 2018-11-24 DIAGNOSIS — Z01812 Encounter for preprocedural laboratory examination: Secondary | ICD-10-CM | POA: Insufficient documentation

## 2018-11-24 LAB — POCT PREGNANCY, URINE: Preg Test, Ur: NEGATIVE

## 2018-11-24 LAB — SARS CORONAVIRUS 2 (TAT 6-24 HRS): SARS Coronavirus 2: NEGATIVE

## 2018-11-24 NOTE — H&P (Signed)
Danielle [name] is an 33 y.o. female [phone] here for scheduled laparoscopic bilateral tubal ligation due to her desire for permanent sterilization. Patient has been using depo-provera for contraception. She is without any complaints  Pertinent Gynecological History: Menses: amenorrhea induced by depo-procera Contraception: Depo-Provera injections DES exposure: denies Blood transfusions: none Last pap: normal Date: 06/2017 OB History: G8, P6026   Menstrual History: No LMP recorded.    Past Medical History:  Diagnosis Date  . Anxiety   . Depression   . Dizzy spells   . Endometriosis   . Foot fracture, right 2011  . Headache(784.0)   . Hypertension    on meds now  . Hypothyroidism   . Leg fracture, right 1998  . Pregnancy induced hypertension 2016  . PTSD (post-traumatic stress disorder)   . Trauma   . Vaginal Pap smear, abnormal 2017   did not f/u as instructed    Past Surgical History:  Procedure Laterality Date  . CHOLECYSTECTOMY N/A 04/26/2018   Procedure: LAPAROSCOPIC CHOLECYSTECTOMY WITH POSSIBLE INTRAOPERATIVE CHOLANGIOGRAM;  Surgeon: Sheliah Hatch De Blanch, MD;  Location: [facility];  Service: General;  Laterality: N/A;  . DILATION AND CURETTAGE OF UTERUS  2008  . TOOTH EXTRACTION      Family History  Problem Relation Age of Onset  . Healthy Mother   . Diabetes Father   . Hypertension Father   . Diabetes Maternal Grandmother   . Cancer Maternal Grandmother   . Diabetes Maternal Grandfather   . Diabetes Paternal Grandmother   . Diabetes Paternal Grandfather   . Other Cousin        sickle cell disease  . Arthritis Other   . Asthma Other   . Early death Other        uncle-suicide  . Cataracts Other   . Congestive Heart Failure Other   . Hyperlipidemia Other   . Hypertension Other   . Stroke Other   . Migraines Other   . Anesthesia problems Neg Hx     Social History:  reports that she has never smoked. She has never used smokeless tobacco. She reports previous  alcohol use. She reports previous drug use.  Allergies:  Allergies  Allergen Reactions  . Latex Hives and Rash    Medications Prior to Admission  Medication Sig Dispense Refill Last Dose  . ARIPiprazole ER (ABILIFY MAINTENA) 400 MG SRER injection Inject 400 mg into the muscle.   Past Month at Unknown time  . butalbital-acetaminophen-caffeine (FIORICET, ESGIC) 50-325-40 MG tablet Take 2 tablets by mouth every 6 (six) hours as needed for headache. 30 tablet 2 Past Month at Unknown time  . docusate sodium (COLACE) 100 MG capsule Take 1 capsule (100 mg total) by mouth 2 (two) times daily as needed for mild constipation or moderate constipation. 60 capsule 5 Past Month at Unknown time  . enalapril (VASOTEC) 20 MG tablet Take 1 tablet (20 mg total) by mouth daily. 30 tablet 3 11/26/2018 at 0500  . ibuprofen (ADVIL) 600 MG tablet Take 1 tablet (600 mg total) by mouth every 6 (six) hours as needed for mild pain, moderate pain or cramping. 30 tablet 2 Past Month at Unknown time    ROS See pertinent in HPI Blood pressure (!) 148/84, pulse 94, temperature 98.5 F (36.9 C), temperature source Oral, resp. rate 16, height 5\' 8"  (1.727 m), weight 111.3 kg, SpO2 100 %, not currently breastfeeding. Physical Exam GENERAL: Well-developed, well-nourished female in no acute distress.  HEENT: Normocephalic, atraumatic. Sclerae anicteric.  NECK:  Supple. Normal thyroid.  LUNGS: Clear to auscultation bilaterally.  HEART: Regular rate and rhythm. ABDOMEN: Soft, nontender, nondistended. No organomegaly. PELVIC: Deferred to OR EXTREMITIES: No cyanosis, clubbing, or edema, 2+ distal pulses.  No results found for this or any previous visit (from the past 24 hour(s)).  No results found.  Assessment/Plan: 33 y.o. [id number]  with undesired fertility, desires permanent sterilization. Other reversible forms of contraception were discussed with patient; she declines all other modalities.  Risks of procedure discussed  with patient including permanence of method, bleeding, infection, injury to surrounding organs and need for additional procedures including laparotomy, risk of regret.  Failure risk of 0.5-1% with increased risk of ectopic gestation if pregnancy occurs was also discussed with patient.         Sairah Knobloch 11/26/2018, 9:16 AM

## 2018-11-26 ENCOUNTER — Ambulatory Visit (HOSPITAL_BASED_OUTPATIENT_CLINIC_OR_DEPARTMENT_OTHER): Payer: Medicaid Other | Admitting: Certified Registered"

## 2018-11-26 ENCOUNTER — Ambulatory Visit (HOSPITAL_BASED_OUTPATIENT_CLINIC_OR_DEPARTMENT_OTHER)
Admission: RE | Admit: 2018-11-26 | Discharge: 2018-11-26 | Disposition: A | Discharge status: Home / Self Care | Payer: Medicaid Other | Attending: Obstetrics and Gynecology | Admitting: Obstetrics and Gynecology

## 2018-11-26 ENCOUNTER — Encounter (HOSPITAL_BASED_OUTPATIENT_CLINIC_OR_DEPARTMENT_OTHER): Payer: Self-pay | Admitting: *Deleted

## 2018-11-26 ENCOUNTER — Other Ambulatory Visit: Payer: Self-pay

## 2018-11-26 ENCOUNTER — Encounter (HOSPITAL_BASED_OUTPATIENT_CLINIC_OR_DEPARTMENT_OTHER)
Admission: RE | Disposition: A | Discharge status: Home / Self Care | Payer: Self-pay | Source: Home / Self Care | Attending: Obstetrics and Gynecology

## 2018-11-26 DIAGNOSIS — Z79899 Other long term (current) drug therapy: Secondary | ICD-10-CM | POA: Insufficient documentation

## 2018-11-26 DIAGNOSIS — I10 Essential (primary) hypertension: Secondary | ICD-10-CM | POA: Diagnosis not present

## 2018-11-26 DIAGNOSIS — Z302 Encounter for sterilization: Secondary | ICD-10-CM | POA: Insufficient documentation

## 2018-11-26 DIAGNOSIS — Z8249 Family history of ischemic heart disease and other diseases of the circulatory system: Secondary | ICD-10-CM | POA: Insufficient documentation

## 2018-11-26 DIAGNOSIS — F329 Major depressive disorder, single episode, unspecified: Secondary | ICD-10-CM | POA: Insufficient documentation

## 2018-11-26 DIAGNOSIS — Z91040 Latex allergy status: Secondary | ICD-10-CM | POA: Diagnosis not present

## 2018-11-26 HISTORY — PX: LAPAROSCOPIC TUBAL LIGATION: SHX1937

## 2018-11-26 SURGERY — LIGATION, FALLOPIAN TUBE, LAPAROSCOPIC
Anesthesia: General | Site: Abdomen | Laterality: Bilateral

## 2018-11-26 MED ORDER — IBUPROFEN 600 MG PO TABS: 600.0000 mg | ORAL_TABLET | Freq: Four times a day (QID) | ORAL | 3 refills | Status: DC | PRN

## 2018-11-26 MED ORDER — ROCURONIUM BROMIDE 10 MG/ML (PF) SYRINGE
PREFILLED_SYRINGE | INTRAVENOUS | Status: DC | PRN
  Administered 2018-11-26: 60 mg via INTRAVENOUS

## 2018-11-26 MED ORDER — MIDAZOLAM HCL 2 MG/2ML IJ SOLN
INTRAMUSCULAR | Status: DC | PRN
  Administered 2018-11-26: 2 mg via INTRAVENOUS

## 2018-11-26 MED ORDER — SUGAMMADEX SODIUM 200 MG/2ML IV SOLN
INTRAVENOUS | Status: DC | PRN
  Administered 2018-11-26: 230 mg via INTRAVENOUS

## 2018-11-26 MED ORDER — DEXAMETHASONE SODIUM PHOSPHATE 10 MG/ML IJ SOLN
INTRAMUSCULAR | Status: DC | PRN
  Administered 2018-11-26: 6 mg via INTRAVENOUS

## 2018-11-26 MED ORDER — METOCLOPRAMIDE HCL 5 MG/ML IJ SOLN: 10.0000 mg | Freq: Once | INTRAMUSCULAR | Status: DC | PRN

## 2018-11-26 MED ORDER — FENTANYL CITRATE (PF) 100 MCG/2ML IJ SOLN
INTRAMUSCULAR | Status: AC
  Filled 2018-11-26: qty 2

## 2018-11-26 MED ORDER — FENTANYL CITRATE (PF) 100 MCG/2ML IJ SOLN: 25.0000 ug | INTRAMUSCULAR | Status: DC | PRN

## 2018-11-26 MED ORDER — DEXAMETHASONE SODIUM PHOSPHATE 10 MG/ML IJ SOLN
INTRAMUSCULAR | Status: AC
  Filled 2018-11-26: qty 1

## 2018-11-26 MED ORDER — KETOROLAC TROMETHAMINE 30 MG/ML IJ SOLN
INTRAMUSCULAR | Status: DC | PRN
  Administered 2018-11-26: 30 mg via INTRAVENOUS

## 2018-11-26 MED ORDER — FENTANYL CITRATE (PF) 100 MCG/2ML IJ SOLN: 50.0000 ug | INTRAMUSCULAR | Status: DC | PRN

## 2018-11-26 MED ORDER — BUPIVACAINE HCL (PF) 0.25 % IJ SOLN
INTRAMUSCULAR | Status: DC | PRN
  Administered 2018-11-26: 10 mL

## 2018-11-26 MED ORDER — SILVER NITRATE-POT NITRATE 75-25 % EX MISC
CUTANEOUS | Status: AC
  Filled 2018-11-26: qty 1

## 2018-11-26 MED ORDER — OXYCODONE-ACETAMINOPHEN 5-325 MG PO TABS: 1.0000 | ORAL_TABLET | Freq: Four times a day (QID) | ORAL | 0 refills | Status: DC | PRN

## 2018-11-26 MED ORDER — MEPERIDINE HCL 25 MG/ML IJ SOLN: 6.2500 mg | INTRAMUSCULAR | Status: DC | PRN

## 2018-11-26 MED ORDER — MIDAZOLAM HCL 2 MG/2ML IJ SOLN: 1.0000 mg | INTRAMUSCULAR | Status: DC | PRN

## 2018-11-26 MED ORDER — LACTATED RINGERS IV SOLN: INTRAVENOUS | Status: DC

## 2018-11-26 MED ORDER — PROPOFOL 10 MG/ML IV BOLUS
INTRAVENOUS | Status: AC
  Filled 2018-11-26: qty 20

## 2018-11-26 MED ORDER — BUPIVACAINE HCL (PF) 0.25 % IJ SOLN
INTRAMUSCULAR | Status: AC
  Filled 2018-11-26: qty 30

## 2018-11-26 MED ORDER — LIDOCAINE 2% (20 MG/ML) 5 ML SYRINGE
INTRAMUSCULAR | Status: AC
  Filled 2018-11-26: qty 5

## 2018-11-26 MED ORDER — ONDANSETRON HCL 4 MG/2ML IJ SOLN
INTRAMUSCULAR | Status: AC
  Filled 2018-11-26: qty 2

## 2018-11-26 MED ORDER — FENTANYL CITRATE (PF) 100 MCG/2ML IJ SOLN
INTRAMUSCULAR | Status: DC | PRN
  Administered 2018-11-26: 100 ug via INTRAVENOUS
  Administered 2018-11-26 (×2): 50 ug via INTRAVENOUS

## 2018-11-26 MED ORDER — MIDAZOLAM HCL 2 MG/2ML IJ SOLN
INTRAMUSCULAR | Status: AC
  Filled 2018-11-26: qty 2

## 2018-11-26 MED ORDER — ONDANSETRON HCL 4 MG/2ML IJ SOLN
INTRAMUSCULAR | Status: DC | PRN
  Administered 2018-11-26: 4 mg via INTRAVENOUS

## 2018-11-26 MED ORDER — LACTATED RINGERS IV SOLN
INTRAVENOUS | Status: DC
  Administered 2018-11-26 (×2): via INTRAVENOUS

## 2018-11-26 MED ORDER — KETOROLAC TROMETHAMINE 30 MG/ML IJ SOLN
INTRAMUSCULAR | Status: AC
  Filled 2018-11-26: qty 1

## 2018-11-26 MED ORDER — LIDOCAINE HCL (CARDIAC) PF 100 MG/5ML IV SOSY
PREFILLED_SYRINGE | INTRAVENOUS | Status: DC | PRN
  Administered 2018-11-26: 100 mg via INTRAVENOUS

## 2018-11-26 MED ORDER — PROPOFOL 10 MG/ML IV BOLUS
INTRAVENOUS | Status: DC | PRN
  Administered 2018-11-26: 200 mg via INTRAVENOUS

## 2018-11-26 MED ORDER — ROCURONIUM BROMIDE 10 MG/ML (PF) SYRINGE
PREFILLED_SYRINGE | INTRAVENOUS | Status: AC
  Filled 2018-11-26: qty 10

## 2018-11-26 SURGICAL SUPPLY — 29 items
BRIEF STRETCH FOR OB PAD XXL (UNDERPADS AND DIAPERS) ×1 IMPLANT
CATH ROBINSON RED A/P 16FR (CATHETERS) IMPLANT
COVER MAYO STAND REUSABLE (DRAPES) IMPLANT
DECANTER SPIKE VIAL GLASS SM (MISCELLANEOUS) ×1 IMPLANT
DRSG OPSITE POSTOP 3X4 (GAUZE/BANDAGES/DRESSINGS) ×2 IMPLANT
DURAPREP 26ML APPLICATOR (WOUND CARE) ×2 IMPLANT
GAUZE SPONGE 4X4 16PLY XRAY LF (GAUZE/BANDAGES/DRESSINGS) ×2 IMPLANT
GLOVE BIOGEL PI IND STRL 6.5 (GLOVE) ×2 IMPLANT
GLOVE BIOGEL PI IND STRL 7.0 (GLOVE) ×2 IMPLANT
GLOVE BIOGEL PI INDICATOR 6.5 (GLOVE) ×2
GLOVE BIOGEL PI INDICATOR 7.0 (GLOVE) ×2
GLOVE SURG SS PI 6.0 STRL IVOR (GLOVE) ×2 IMPLANT
GOWN STRL REUS W/TWL LRG LVL3 (GOWN DISPOSABLE) ×2 IMPLANT
NS IRRIG 1000ML POUR BTL (IV SOLUTION) ×1 IMPLANT
PACK LAPAROSCOPY BASIN (CUSTOM PROCEDURE TRAY) ×2 IMPLANT
PACK TRENDGUARD 450 HYBRID PRO (MISCELLANEOUS) IMPLANT
PACK TRENDGUARD 600 HYBRD PROC (MISCELLANEOUS) IMPLANT
PAD ARMBOARD 7.5X6 YLW CONV (MISCELLANEOUS) ×4 IMPLANT
PAD OB MATERNITY 4.3X12.25 (PERSONAL CARE ITEMS) ×2 IMPLANT
PAD PREP 24X48 CUFFED NSTRL (MISCELLANEOUS) ×2 IMPLANT
SET TUBE SMOKE EVAC HIGH FLOW (TUBING) ×2 IMPLANT
SLEEVE SCD COMPRESS KNEE MED (MISCELLANEOUS) ×4 IMPLANT
SUT MON AB 4-0 PS1 27 (SUTURE) ×2 IMPLANT
SUT VICRYL 0 UR6 27IN ABS (SUTURE) ×2 IMPLANT
TOWEL GREEN STERILE FF (TOWEL DISPOSABLE) ×4 IMPLANT
TRENDGUARD 450 HYBRID PRO PACK (MISCELLANEOUS) ×2
TRENDGUARD 600 HYBRID PROC PK (MISCELLANEOUS)
TROCAR BALLN 12MMX100 BLUNT (TROCAR) ×2 IMPLANT
WARMER LAPAROSCOPE (MISCELLANEOUS) ×2 IMPLANT

## 2018-11-26 NOTE — Anesthesia Procedure Notes (Signed)
Procedure Name: Intubation Date/Time: 11/26/2018 9:37 AM Performed by: Raenette Rover, CRNA Pre-anesthesia Checklist: Patient identified, Emergency Drugs available, Suction available and Patient being monitored Patient Re-evaluated:Patient Re-evaluated prior to induction Oxygen Delivery Method: Circle system utilized Preoxygenation: Pre-oxygenation with 100% oxygen Induction Type: IV induction Ventilation: Mask ventilation without difficulty Laryngoscope Size: Mac and 3 Grade View: Grade I Tube type: Oral Tube size: 7.0 mm Number of attempts: 1 Airway Equipment and Method: Stylet Placement Confirmation: ETT inserted through vocal cords under direct vision,  positive ETCO2 and breath sounds checked- equal and bilateral Secured at: 20 cm Tube secured with: Tape Dental Injury: Teeth and Oropharynx as per pre-operative assessment

## 2018-11-26 NOTE — Discharge Instructions (Signed)
Laparoscopic Tubal Ligation, Care After °This sheet gives you information about how to care for yourself after your procedure. Your health care provider may also give you more specific instructions. If you have problems or questions, contact your health care provider. °What can I expect after the procedure? °After the procedure, it is common to have: °· A sore throat. °· Discomfort in your shoulder. °· Mild discomfort or cramping in your abdomen. °· Gas pains. °· Pain or soreness in the area where the surgical incision was made. °· A bloated feeling. °· Tiredness. °· Nausea. °· Vomiting. °Follow these instructions at home: °Medicines °· Take over-the-counter and prescription medicines only as told by your health care provider. °· Do not take aspirin because it can cause bleeding. °· Ask your health care provider if the medicine prescribed to you: °? Requires you to avoid driving or using heavy machinery. °? Can cause constipation. You may need to take actions to prevent or treat constipation, such as: °§ Drink enough fluid to keep your urine pale yellow. °§ Take over-the-counter or prescription medicines. °§ Eat foods that are high in fiber, such as beans, whole grains, and fresh fruits and vegetables. °§ Limit foods that are high in fat and processed sugars, such as fried or sweet foods. °Incision care ° °  ° °· Follow instructions from your health care provider about how to take care of your incision. Make sure you: °? Wash your hands with soap and water before and after you change your bandage (dressing). If soap and water are not available, use hand sanitizer. °? Change your dressing as told by your health care provider. °? Leave stitches (sutures), skin glue, or adhesive strips in place. These skin closures may need to stay in place for 2 weeks or longer. If adhesive strip edges start to loosen and curl up, you may trim the loose edges. Do not remove adhesive strips completely unless your health care provider  tells you to do that. °· Check your incision area every day for signs of infection. Check for: °? Redness, swelling, or pain. °? Fluid or blood. °? Warmth. °? Pus or a bad smell. °Activity °· Rest as told by your health care provider. °· Avoid sitting for a long time without moving. Get up to take short walks every 1-2 hours. This is important to improve blood flow and breathing. Ask for help if you feel weak or unsteady. °· Return to your normal activities as told by your health care provider. Ask your health care provider what activities are safe for you. °General instructions °· Do not take baths, swim, or use a hot tub until your health care provider approves. Ask your health care provider if you may take showers. You may only be allowed to take sponge baths. °· Have someone help you with your daily household tasks for the first few days. °· Keep all follow-up visits as told by your health care provider. This is important. °Contact a health care provider if: °· You have redness, swelling, or pain around your incision. °· Your incision feels warm to the touch. °· You have pus or a bad smell coming from your incision. °· The edges of your incision break open after the sutures have been removed. °· Your pain does not improve after 2-3 days. °· You have a rash. °· You repeatedly become dizzy or light-headed. °· Your pain medicine is not helping. °Get help right away if you: °· Have a fever. °· Faint. °· Have increasing   pain in your abdomen.  Have severe pain in one or both of your shoulders.  Have fluid or blood coming from your sutures or from your vagina.  Have shortness of breath or difficulty breathing.  Have chest pain or leg pain.  Have ongoing nausea, vomiting, or diarrhea. Summary  After the procedure, it is common to have mild discomfort or cramping in your abdomen.  Take over-the-counter and prescription medicines only as told by your health care provider.  Watch for symptoms that should  prompt you to call your health care provider.  Keep all follow-up visits as told by your health care provider. This is important. This information is not intended to replace advice given to you by your health care provider. Make sure you discuss any questions you have with your health care provider. Document Released: 08/11/2004 Document Revised: 12/17/2017 Document Reviewed: 12/17/2017 Else[employer] Patient Education  [name].  No ibuprofen until 4pm   Post Anesthesia Home Care Instructions  Activity: Get plenty of rest for the remainder of the day. A responsible individual must stay with you for 24 hours following the procedure.  For the next 24 hours, DO NOT: -Drive a car -Paediatric nurse -Drink alcoholic beverages -Take any medication unless instructed by your physician -Make any legal decisions or sign important papers.  Meals: Start with liquid foods such as gelatin or soup. Progress to regular foods as tolerated. Avoid greasy, spicy, heavy foods. If nausea and/or vomiting occur, drink only clear liquids until the nausea and/or vomiting subsides. Call your physician if vomiting continues.  Special Instructions/Symptoms: Your throat may feel dry or sore from the anesthesia or the breathing tube placed in your throat during surgery. If this causes discomfort, gargle with warm salt water. The discomfort should disappear within 24 hours.  If you had a scopolamine patch placed behind your ear for the management of post- operative nausea and/or vomiting:  1. The medication in the patch is effective for 72 hours, after which it should be removed.  Wrap patch in a tissue and discard in the trash. Wash hands thoroughly with soap and water. 2. You may remove the patch earlier than 72 hours if you experience unpleasant side effects which may include dry mouth, dizziness or visual disturbances. 3. Avoid touching the patch. Wash your hands with soap and water after contact with the  patch.

## 2018-11-26 NOTE — Transfer of Care (Signed)
Immediate Anesthesia Transfer of Care Note  Patient: Danielle [name]  Procedure(s) Performed: LAPAROSCOPIC TUBAL LIGATION (Bilateral Abdomen)  Patient Location: PACU  Anesthesia Type:General  Level of Consciousness: awake, alert , oriented and patient cooperative  Airway & Oxygen Therapy: Patient Spontanous Breathing and Patient connected to face mask oxygen  Post-op Assessment: Report given to RN and Post -op Vital signs reviewed and stable  Post vital signs: Reviewed and stable  Last Vitals:  Vitals Value Taken Time  BP 130/74 11/26/18 1019  Temp    Pulse 65 11/26/18 1022  Resp 14 11/26/18 1022  SpO2 100 % 11/26/18 1022  Vitals shown include unvalidated device data.  Last Pain:  Vitals:   11/26/18 0836  TempSrc: Oral  PainSc: 0-No pain         Complications: No apparent anesthesia complications

## 2018-11-26 NOTE — Anesthesia Postprocedure Evaluation (Signed)
Anesthesia Post Note  Patient: Marleni Engh  Procedure(s) Performed: LAPAROSCOPIC TUBAL LIGATION (Bilateral Abdomen)     Patient location during evaluation: PACU Anesthesia Type: General Level of consciousness: awake and alert Pain management: pain level controlled Vital Signs Assessment: post-procedure vital signs reviewed and stable Respiratory status: spontaneous breathing, nonlabored ventilation, respiratory function stable and patient connected to nasal cannula oxygen Cardiovascular status: blood pressure returned to baseline and stable Postop Assessment: no apparent nausea or vomiting Anesthetic complications: no    Last Vitals:  Vitals:   11/26/18 1045 11/26/18 1115  BP: 127/69 140/86  Pulse: 71 74  Resp: 16 16  Temp:  37.2 C  SpO2: 100% 100%    Last Pain:  Vitals:   11/26/18 1115  TempSrc:   PainSc: 0-No pain                 Montez Hageman

## 2018-11-26 NOTE — Anesthesia Preprocedure Evaluation (Signed)
Anesthesia Evaluation  Patient identified by MRN, date of birth, ID band Patient awake    Reviewed: Allergy & Precautions, NPO status , Patient's Chart, lab work & pertinent test results  Airway Mallampati: II  TM Distance: >3 FB Neck ROM: Full    Dental no notable dental hx.    Pulmonary neg pulmonary ROS,    Pulmonary exam normal breath sounds clear to auscultation       Cardiovascular hypertension, Pt. on medications negative cardio ROS Normal cardiovascular exam Rhythm:Regular Rate:Normal     Neuro/Psych negative neurological ROS  negative psych ROS   GI/Hepatic negative GI ROS, Neg liver ROS,   Endo/Other  negative endocrine ROS  Renal/GU negative Renal ROS  negative genitourinary   Musculoskeletal negative musculoskeletal ROS (+)   Abdominal   Peds negative pediatric ROS (+)  Hematology negative hematology ROS (+)   Anesthesia Other Findings   Reproductive/Obstetrics negative OB ROS                             Anesthesia Physical Anesthesia Plan  ASA: II  Anesthesia Plan: General   Post-op Pain Management:    Induction: Intravenous  PONV Risk Score and Plan: 3 and Ondansetron, Dexamethasone and Treatment may vary due to age or medical condition  Airway Management Planned: Oral ETT  Additional Equipment:   Intra-op Plan:   Post-operative Plan:   Informed Consent: I have reviewed the patients History and Physical, chart, labs and discussed the procedure including the risks, benefits and alternatives for the proposed anesthesia with the patient or authorized representative who has indicated his/her understanding and acceptance.     Dental advisory given  Plan Discussed with: CRNA  Anesthesia Plan Comments:         Anesthesia Quick Evaluation

## 2018-11-26 NOTE — Op Note (Signed)
Koren Cortright 11/26/2018  PREOPERATIVE DIAGNOSIS:  Undesired fertility  POSTOPERATIVE DIAGNOSIS:  Undesired fertility  PROCEDURE:  Laparoscopic Bilateral Tubal Sterilization using coagulation   SURGEON: Dr. Mora Bellman  ANESTHESIA:  General endotracheal  COMPLICATIONS:  None immediate.  ESTIMATED BLOOD LOSS:  Less than 20 ml.  FLUIDS: 1000 ml LR.   INDICATIONS: 33 y.o. [id number]  with undesired fertility, desires permanent sterilization. Other reversible forms of contraception were discussed with patient; she declines all other modalities.  Risks of procedure discussed with patient including permanence of method, bleeding, infection, injury to surrounding organs and need for additional procedures including laparotomy, risk of regret.  Failure risk of 0.5-1% with increased risk of ectopic gestation if pregnancy occurs was also discussed with patient.      FINDINGS:  Normal uterus, tubes, and ovaries.  TECHNIQUE:  The patient was taken to the operating room where general anesthesia was obtained without difficulty.  She was then placed in the dorsal lithotomy position and prepared and draped in sterile fashion.  After an adequate timeout was performed, a bivalved speculum was then placed in the patient's vagina, and the anterior lip of cervix grasped with the single-tooth tenaculum.  The uterine manipulator was then advanced into the uterus.  The speculum was removed from the vagina.  Attention was then turned to the patient's abdomen where a 10-mm skin incision was made on the umbilical fold.  The underlying fascia was identified, grasped with Kocher clamps, tented up and entered sharply with mayo scissors. The fascia was tagged with 0-Vicryl. The peritoneum was entered bluntly.The 11 mm trocar and sleeve were introduced into the abdominal cavityl.  Intraperitoneal placement was confirmed with the use of the laparoscope. Pneumoperitoneum was achieved by the insufflation of CO2 gas. A survey  of the patient's pelvis and abdomen revealed entirely normal anatomy.  The fallopian tubes were observed and found to be normal in appearance. Bipolar forceps was then advanced through the operative port and used to coagulate a 3-cm portion of the left tube in the mid isthmic area.  Good blanching and coagulation was noted at the site of the application.  There was no bleeding noted in the mesosalpinx.  A similar process was carried out on the right fallopian tube. Good hemostasis was noted overall. The instruments were then removed from the patient's abdomen and the fascial incision was repaired with 0 Vicryl, and the skin was closed with 4-0 Vicryl. The uterine manipulator and the tenaculum were removed from the vagina without complications. The patient tolerated the procedure well.  Sponge, lap, and needle counts were correct times two.  The patient was then taken to the recovery room awake, extubated and in stable  in stable condition.

## 2018-11-27 ENCOUNTER — Encounter (HOSPITAL_BASED_OUTPATIENT_CLINIC_OR_DEPARTMENT_OTHER): Payer: Self-pay | Admitting: Obstetrics and Gynecology

## 2018-12-10 ENCOUNTER — Other Ambulatory Visit: Payer: Self-pay | Admitting: Obstetrics and Gynecology

## 2018-12-10 DIAGNOSIS — N938 Other specified abnormal uterine and vaginal bleeding: Secondary | ICD-10-CM

## 2018-12-15 ENCOUNTER — Telehealth: Payer: Medicaid Other | Admitting: Obstetrics and Gynecology

## 2018-12-23 ENCOUNTER — Other Ambulatory Visit: Payer: Self-pay

## 2018-12-23 ENCOUNTER — Ambulatory Visit (HOSPITAL_COMMUNITY)
Admission: RE | Admit: 2018-12-23 | Discharge: 2018-12-23 | Disposition: A | Discharge status: Home / Self Care | Payer: Medicaid Other | Source: Ambulatory Visit | Attending: Obstetrics and Gynecology | Admitting: Obstetrics and Gynecology

## 2018-12-23 DIAGNOSIS — N938 Other specified abnormal uterine and vaginal bleeding: Secondary | ICD-10-CM | POA: Insufficient documentation

## 2018-12-25 ENCOUNTER — Other Ambulatory Visit: Payer: Self-pay | Admitting: Obstetrics and Gynecology

## 2018-12-25 MED ORDER — KETOROLAC TROMETHAMINE 10 MG PO TABS: 10.0000 mg | ORAL_TABLET | Freq: Four times a day (QID) | ORAL | 0 refills | Status: DC | PRN

## 2018-12-29 ENCOUNTER — Telehealth: Payer: Medicaid Other | Admitting: Obstetrics and Gynecology

## 2018-12-29 NOTE — Progress Notes (Signed)
Virtual GYN visit to discuss AUB and U/S Results on 12/23/18.

## 2018-12-29 NOTE — Progress Notes (Signed)
Patient never joined [facility] visit. Mychart message was sent

## 2019-01-22 ENCOUNTER — Other Ambulatory Visit: Payer: Self-pay | Admitting: Obstetrics

## 2019-01-22 DIAGNOSIS — G44001 Cluster headache syndrome, unspecified, intractable: Secondary | ICD-10-CM

## 2019-01-23 NOTE — Telephone Encounter (Signed)
Refill request for:   Name from pharmacy: BUTALB-ACETAMIN-CAFF 50-325-40        Will file in chart as: butalbital-acetaminophen-caffeine (FIORICET) 50-325-40 MG tablet   Sig: Take 2 tablets by mouth every 6 (six) hours as needed for headache.   Disp:  30 tablet    Refills:  2   Start: 01/22/2019   Class: Normal   Non-formulary For: Intractable cluster headache syndrome, unspecified chronicity pattern   Last ordered: 8 months ago by Shelly Bombard, MD Last refill: 09/21/2018   Rx #: [id number]

## 2019-03-09 ENCOUNTER — Other Ambulatory Visit: Payer: Self-pay

## 2019-03-09 ENCOUNTER — Other Ambulatory Visit: Payer: Self-pay | Admitting: Obstetrics

## 2019-03-09 ENCOUNTER — Ambulatory Visit (INDEPENDENT_AMBULATORY_CARE_PROVIDER_SITE_OTHER): Payer: Medicaid Other

## 2019-03-09 ENCOUNTER — Other Ambulatory Visit (HOSPITAL_COMMUNITY)
Admission: RE | Admit: 2019-03-09 | Discharge: 2019-03-09 | Disposition: A | Discharge status: Home / Self Care | Payer: Medicaid Other | Source: Ambulatory Visit | Attending: Obstetrics and Gynecology | Admitting: Obstetrics and Gynecology

## 2019-03-09 VITALS — BP 141/76 | HR 89 | Ht 68.0 in | Wt 260.5 lb

## 2019-03-09 DIAGNOSIS — N898 Other specified noninflammatory disorders of vagina: Secondary | ICD-10-CM

## 2019-03-09 DIAGNOSIS — N3000 Acute cystitis without hematuria: Secondary | ICD-10-CM

## 2019-03-09 DIAGNOSIS — G44001 Cluster headache syndrome, unspecified, intractable: Secondary | ICD-10-CM

## 2019-03-09 DIAGNOSIS — Z8669 Personal history of other diseases of the nervous system and sense organs: Secondary | ICD-10-CM

## 2019-03-09 LAB — POCT URINALYSIS DIPSTICK
Bilirubin, UA: NEGATIVE
Blood, UA: NEGATIVE
Glucose, UA: NEGATIVE
Ketones, UA: NEGATIVE
Nitrite, UA: NEGATIVE
Protein, UA: POSITIVE — AB
Spec Grav, UA: 1.02 (ref 1.010–1.025)
Urobilinogen, UA: 0.2 E.U./dL
pH, UA: 5 (ref 5.0–8.0)

## 2019-03-09 NOTE — Progress Notes (Signed)
Agree with A & P. 

## 2019-03-09 NOTE — Progress Notes (Signed)
SUBJECTIVE:  34 y.o. female complains of green/yellowish  vaginal discharge for 10 day(s). Denies abnormal vaginal bleeding or significant pelvic pain or fever.  UTI, painful urination . Denies history of known exposure to STD.  Patient's last menstrual period was 02/24/2019 (exact date).  OBJECTIVE:  She appears well, afebrile. Urine dipstick: Positive Protein, Leu.  ASSESSMENT:  Vaginal Discharge  Vaginal Odor Dysuria   PLAN:  GC, chlamydia, trichomonas, BVAG, CVAG probe sent to lab. Urine Culture sent to Lab Treatment: To be determined once lab results are received ROV prn if symptoms persist or worsen, increase fluid intake.

## 2019-03-10 ENCOUNTER — Other Ambulatory Visit: Payer: Self-pay

## 2019-03-10 ENCOUNTER — Other Ambulatory Visit: Payer: Self-pay | Admitting: Obstetrics

## 2019-03-10 DIAGNOSIS — G44001 Cluster headache syndrome, unspecified, intractable: Secondary | ICD-10-CM

## 2019-03-10 DIAGNOSIS — Z8669 Personal history of other diseases of the nervous system and sense organs: Secondary | ICD-10-CM

## 2019-03-10 LAB — CERVICOVAGINAL ANCILLARY ONLY
Bacterial Vaginitis (gardnerella): NEGATIVE
Candida Glabrata: NEGATIVE
Candida Vaginitis: POSITIVE — AB
Chlamydia: NEGATIVE
Comment: NEGATIVE
Comment: NEGATIVE
Comment: NEGATIVE
Comment: NEGATIVE
Comment: NEGATIVE
Comment: NORMAL
Neisseria Gonorrhea: NEGATIVE
Trichomonas: NEGATIVE

## 2019-03-10 MED ORDER — BUTALBITAL-APAP-CAFFEINE 50-325-40 MG PO TABS: 2.0000 | ORAL_TABLET | Freq: Four times a day (QID) | ORAL | 0 refills | Status: DC | PRN

## 2019-03-10 MED ORDER — FLUCONAZOLE 150 MG PO TABS: 150.0000 mg | ORAL_TABLET | Freq: Once | ORAL | 0 refills | Status: AC

## 2019-03-10 NOTE — Progress Notes (Signed)
Diflucan sent for yeast per Dr. Alysia Penna

## 2019-03-12 LAB — URINE CULTURE

## 2019-04-16 ENCOUNTER — Ambulatory Visit: Payer: Medicaid Other | Admitting: Neurology

## 2019-04-16 ENCOUNTER — Encounter: Payer: Self-pay | Admitting: Neurology

## 2019-04-16 ENCOUNTER — Telehealth: Payer: Self-pay | Admitting: Neurology

## 2019-04-16 ENCOUNTER — Other Ambulatory Visit: Payer: Self-pay

## 2019-04-16 VITALS — BP 145/79 | HR 83 | Temp 98.3°F | Ht 68.0 in | Wt 263.0 lb

## 2019-04-16 DIAGNOSIS — R519 Headache, unspecified: Secondary | ICD-10-CM | POA: Diagnosis not present

## 2019-04-16 DIAGNOSIS — R51 Headache with orthostatic component, not elsewhere classified: Secondary | ICD-10-CM | POA: Diagnosis not present

## 2019-04-16 DIAGNOSIS — G43709 Chronic migraine without aura, not intractable, without status migrainosus: Secondary | ICD-10-CM

## 2019-04-16 MED ORDER — EMGALITY 120 MG/ML ~~LOC~~ SOAJ: 120.0000 mg | SUBCUTANEOUS | 11 refills | Status: DC

## 2019-04-16 MED ORDER — RIZATRIPTAN BENZOATE 10 MG PO TBDP: 10.0000 mg | ORAL_TABLET | ORAL | 11 refills | Status: DC | PRN

## 2019-04-16 MED ORDER — EMGALITY 120 MG/ML ~~LOC~~ SOAJ: 240.0000 mg | SUBCUTANEOUS | 0 refills | Status: DC
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
## 2019-04-16 NOTE — Telephone Encounter (Signed)
medicaid order sent to GI. They will obtain the auth and reach out to the patient to schedule.  °

## 2019-04-16 NOTE — Patient Instructions (Signed)
Start Emgality, 2 injections first month then one injection monthly Rizatriptan: Please take one tablet at the onset of your headache. If it does not improve the symptoms please take one additional tablet. Do not take more then 2 tablets in 24hrs. Do not take use more then 2 to 3 times in a week. Blood work today MRI of the brain  Rizatriptan disintegrating tablets What is this medicine? RIZATRIPTAN (rye za TRIP tan) is used to treat migraines with or without aura. An aura is a strange feeling or visual disturbance that warns you of an attack. It is not used to prevent migraines. This medicine may be used for other purposes; ask your health care provider or pharmacist if you have questions. COMMON BRAND NAME(S): Maxalt-MLT What should I tell my health care provider before I take this medicine? They need to know if you have any of these conditions:  cigarette smoker  circulation problems in fingers and toes  diabetes  heart disease  high blood pressure  high cholesterol  history of irregular heartbeat  history of stroke  kidney disease  liver disease  stomach or intestine problems  an unusual or allergic reaction to rizatriptan, other medicines, foods, dyes, or preservatives  pregnant or trying to get pregnant  breast-feeding How should I use this medicine? Take this medicine by mouth. Follow the directions on the prescription label. Leave the tablet in the sealed blister pack until you are ready to take it. With dry hands, open the blister and gently remove the tablet. If the tablet breaks or crumbles, throw it away and take a new tablet out of the blister pack. Place the tablet in the mouth and allow it to dissolve, and then swallow. Do not cut, crush, or chew this medicine. You do not need water to take this medicine. Do not take it more often than directed. Talk to your pediatrician regarding the use of this medicine in children. While this drug may be prescribed for  children as young as 6 years for selected conditions, precautions do apply. Overdosage: If you think you have taken too much of this medicine contact a poison control center or emergency room at once. NOTE: This medicine is only for you. Do not share this medicine with others. What if I miss a dose? This does not apply. This medicine is not for regular use. What may interact with this medicine? Do not take this medicine with any of the following medicines:  certain medicines for migraine headache like almotriptan, eletriptan, frovatriptan, naratriptan, rizatriptan, sumatriptan, zolmitriptan  ergot alkaloids like dihydroergotamine, ergonovine, ergotamine, methylergonovine  MAOIs like Carbex, Eldepryl, Marplan, Nardil, and Parnate This medicine may also interact with the following medications:  certain medicines for depression, anxiety, or psychotic disorders  propranolol This list may not describe all possible interactions. Give your health care provider a list of all the medicines, herbs, non-prescription drugs, or dietary supplements you use. Also tell them if you smoke, drink alcohol, or use illegal drugs. Some items may interact with your medicine. What should I watch for while using this medicine? Visit your healthcare professional for regular checks on your progress. Tell your healthcare professional if your symptoms do not start to get better or if they get worse. You may get drowsy or dizzy. Do not drive, use machinery, or do anything that needs mental alertness until you know how this medicine affects you. Do not stand up or sit up quickly, especially if you are an older patient. This reduces the  risk of dizzy or fainting spells. Alcohol may interfere with the effect of this medicine. Your mouth may get dry. Chewing sugarless gum or sucking hard candy and drinking plenty of water may help. Contact your healthcare professional if the problem does not go away or is severe. If you take  migraine medicines for 10 or more days a month, your migraines may get worse. Keep a diary of headache days and medicine use. Contact your healthcare professional if your migraine attacks occur more frequently. What side effects may I notice from receiving this medicine? Side effects that you should report to your doctor or health care professional as soon as possible:  allergic reactions like skin rash, itching or hives, swelling of the face, lips, or tongue  chest pain or chest tightness  signs and symptoms of a dangerous change in heartbeat or heart rhythm like chest pain; dizziness; fast, irregular heartbeat; palpitations; feeling faint or lightheaded; falls; breathing problems  signs and symptoms of a stroke like changes in vision; confusion; trouble speaking or understanding; severe headaches; sudden numbness or weakness of the face, arm or leg; trouble walking; dizziness; loss of balance or coordination  signs and symptoms of serotonin syndrome like irritable; confusion; diarrhea; fast or irregular heartbeat; muscle twitching; stiff muscles; trouble walking; sweating; high fever; seizures; chills; vomiting Side effects that usually do not require medical attention (report to your doctor or health care professional if they continue or are bothersome):  diarrhea  dizziness  drowsiness  dry mouth  headache  nausea, vomiting  pain, tingling, numbness in the hands or feet  stomach pain This list may not describe all possible side effects. Call your doctor for medical advice about side effects. You may report side effects to FDA at 1-800-FDA-1088. Where should I keep my medicine? Keep out of the reach of children. Store at room temperature between 15 and 30 degrees C (59 and 86 degrees F). Protect from light and moisture. Throw away any unused medicine after the expiration date. NOTE: This sheet is a summary. It may not cover all possible information. If you have questions about  this medicine, talk to your doctor, pharmacist, or health care provider.  2020 Elsevier/Gold Standard (2017-08-06 14:58:08) Galcanezumab injection What is this medicine? GALCANEZUMAB (gal ka NEZ ue mab) is used to prevent migraines and treat cluster headaches. This medicine may be used for other purposes; ask your health care provider or pharmacist if you have questions. COMMON BRAND NAME(S): Emgality What should I tell my health care provider before I take this medicine? They need to know if you have any of these conditions:  an unusual or allergic reaction to galcanezumab, other medicines, foods, dyes, or preservatives  pregnant or trying to get pregnant  breast-feeding How should I use this medicine? This medicine is for injection under the skin. You will be taught how to prepare and give this medicine. Use exactly as directed. Take your medicine at regular intervals. Do not take your medicine more often than directed. It is important that you put your used needles and syringes in a special sharps container. Do not put them in a trash can. If you do not have a sharps container, call your pharmacist or healthcare provider to get one. Talk to your pediatrician regarding the use of this medicine in children. Special care may be needed. Overdosage: If you think you have taken too much of this medicine contact a poison control center or emergency room at once. NOTE: This medicine is only  for you. Do not share this medicine with others. What if I miss a dose? If you miss a dose, take it as soon as you can. If it is almost time for your next dose, take only that dose. Do not take double or extra doses. What may interact with this medicine? Interactions are not expected. This list may not describe all possible interactions. Give your health care provider a list of all the medicines, herbs, non-prescription drugs, or dietary supplements you use. Also tell them if you smoke, drink alcohol, or use  illegal drugs. Some items may interact with your medicine. What should I watch for while using this medicine? Tell your doctor or healthcare professional if your symptoms do not start to get better or if they get worse. What side effects may I notice from receiving this medicine? Side effects that you should report to your doctor or health care professional as soon as possible:  allergic reactions like skin rash, itching or hives, swelling of the face, lips, or tongue Side effects that usually do not require medical attention (report these to your doctor or health care professional if they continue or are bothersome):  pain, redness, or irritation at site where injected This list may not describe all possible side effects. Call your doctor for medical advice about side effects. You may report side effects to FDA at 1-800-FDA-1088. Where should I keep my medicine? Keep out of the reach of children. You will be instructed on how to store this medicine. Throw away any unused medicine after the expiration date on the label. NOTE: This sheet is a summary. It may not cover all possible information. If you have questions about this medicine, talk to your doctor, pharmacist, or health care provider.  2020 Elsevier/Gold Standard (2017-07-10 12:03:23)

## 2019-04-17 LAB — COMPREHENSIVE METABOLIC PANEL
ALT: 12 IU/L (ref 0–32)
AST: 16 IU/L (ref 0–40)
Albumin/Globulin Ratio: 1.4 (ref 1.2–2.2)
Albumin: 4.1 g/dL (ref 3.8–4.8)
Alkaline Phosphatase: 87 IU/L (ref 39–117)
BUN/Creatinine Ratio: 11 (ref 9–23)
BUN: 6 mg/dL (ref 6–20)
Bilirubin Total: <0.2 mg/dL (ref 0.0–1.2)
CO2: 23 mmol/L (ref 20–29)
Calcium: 9.2 mg/dL (ref 8.7–10.2)
Chloride: 104 mmol/L (ref 96–106)
Creatinine, Ser: 0.55 mg/dL — ABNORMAL LOW (ref 0.57–1.00)
GFR calc Af Amer: 142 mL/min/{1.73_m2} (ref >59)
GFR calc non Af Amer: 123 mL/min/{1.73_m2} (ref >59)
Globulin, Total: 3 g/dL (ref 1.5–4.5)
Glucose: 87 mg/dL (ref 65–99)
Potassium: 4.1 mmol/L (ref 3.5–5.2)
Sodium: 140 mmol/L (ref 134–144)
Total Protein: 7.1 g/dL (ref 6.0–8.5)

## 2019-04-17 LAB — CBC
Hematocrit: 33 % — ABNORMAL LOW (ref 34.0–46.6)
Hemoglobin: 9.6 g/dL — ABNORMAL LOW (ref 11.1–15.9)
MCH: 19.8 pg — ABNORMAL LOW (ref 26.6–33.0)
MCHC: 29.1 g/dL — ABNORMAL LOW (ref 31.5–35.7)
MCV: 68 fL — ABNORMAL LOW (ref 79–97)
Platelets: 276 10*3/uL (ref 150–450)
RBC: 4.86 x10E6/uL (ref 3.77–5.28)
RDW: 19.1 % — ABNORMAL HIGH (ref 11.7–15.4)
WBC: 7.2 10*3/uL (ref 3.4–10.8)

## 2019-04-17 LAB — TSH: TSH: 1.28 u[IU]/mL (ref 0.450–4.500)

## 2019-06-23 ENCOUNTER — Ambulatory Visit (INDEPENDENT_AMBULATORY_CARE_PROVIDER_SITE_OTHER): Payer: Medicaid Other

## 2019-06-23 ENCOUNTER — Other Ambulatory Visit (HOSPITAL_COMMUNITY)
Admission: RE | Admit: 2019-06-23 | Discharge: 2019-06-23 | Disposition: A | Discharge status: Home / Self Care | Payer: Medicaid Other | Source: Ambulatory Visit | Attending: Obstetrics and Gynecology | Admitting: Obstetrics and Gynecology

## 2019-06-23 ENCOUNTER — Other Ambulatory Visit: Payer: Self-pay

## 2019-06-23 VITALS — BP 131/80 | HR 83 | Wt 268.0 lb

## 2019-06-23 DIAGNOSIS — N898 Other specified noninflammatory disorders of vagina: Secondary | ICD-10-CM | POA: Insufficient documentation

## 2019-06-23 NOTE — Progress Notes (Addendum)
SUBJECTIVE:  34 y.o. female complains of itching, grey vaginal discharge for 4 day(s). Denies abnormal vaginal bleeding or significant pelvic pain, dysuria, burning or fever. No UTI symptoms. Denies history of known exposure to STD.  Patient's last menstrual period was 06/03/2019 (exact date).  OBJECTIVE:  She appears well, afebrile. Urine dipstick: not done.  ASSESSMENT:  Vaginal Discharge  Vaginal itching   PLAN:  GC, chlamydia, trichomonas, BVAG, CVAG probe sent to lab.  Treatment: To be determined once lab results are received ROV prn if symptoms persist or worsen.    Chart reviewed for nurse visit. Agree with plan of care.   Currie Paris, NP 06/23/2019 5:08 PM

## 2019-06-24 ENCOUNTER — Other Ambulatory Visit: Payer: Self-pay | Admitting: Nurse Practitioner

## 2019-06-24 LAB — CERVICOVAGINAL ANCILLARY ONLY
Bacterial Vaginitis (gardnerella): POSITIVE — AB
Candida Glabrata: NEGATIVE
Candida Vaginitis: NEGATIVE
Chlamydia: NEGATIVE
Comment: NEGATIVE
Comment: NEGATIVE
Comment: NEGATIVE
Comment: NEGATIVE
Comment: NEGATIVE
Comment: NORMAL
Neisseria Gonorrhea: NEGATIVE
Trichomonas: NEGATIVE

## 2019-06-24 MED ORDER — METRONIDAZOLE 500 MG PO TABS: 500.0000 mg | ORAL_TABLET | Freq: Two times a day (BID) | ORAL | 0 refills | Status: DC

## 2019-06-25 ENCOUNTER — Encounter: Payer: Self-pay | Admitting: Obstetrics & Gynecology

## 2019-06-25 ENCOUNTER — Telehealth (INDEPENDENT_AMBULATORY_CARE_PROVIDER_SITE_OTHER): Payer: Medicaid Other | Admitting: Obstetrics & Gynecology

## 2019-06-25 DIAGNOSIS — N920 Excessive and frequent menstruation with regular cycle: Secondary | ICD-10-CM | POA: Diagnosis not present

## 2019-06-25 NOTE — Progress Notes (Signed)
Virtual Birth Control Consult   LMP: 06/03/19   CC:  irregular bleeding      bleeding for 8+ days super plus soaks tampon /pad in 1 hr.  Pt takes IBP for pain w/ periods.  *BTL

## 2019-06-25 NOTE — Progress Notes (Signed)
TELEHEALTH GYNECOLOGY VISIT ENCOUNTER NOTE  I connected with Danielle [name] on 06/25/19 at  2:15 PM EDT by telephone at home and verified that I am speaking with the correct person using two identifiers.   I discussed the limitations, risks, security and privacy concerns of performing an evaluation and management service by telephone and the availability of in person appointments. I also discussed with the patient that there may be a patient responsible charge related to this service. The patient expressed understanding and agreed to proceed.   History:  Danielle [name] is a 34 y.o. [phone] female being evaluated today for heavy periods. She had BTL 11/2018, and is interested in endometrial ablation    Past Medical History:  Diagnosis Date  . Anxiety   . Depression   . Dizzy spells   . Endometriosis   . Foot fracture, right 2011  . Headache(784.0)   . Hypertension    on meds now  . Hypothyroidism   . Leg fracture, right 1998  . Pregnancy induced hypertension 2016  . PTSD (post-traumatic stress disorder)   . Trauma   . Vaginal Pap smear, abnormal 2017   did not f/u as instructed   Past Surgical History:  Procedure Laterality Date  . CHOLECYSTECTOMY N/A 04/26/2018   Procedure: LAPAROSCOPIC CHOLECYSTECTOMY WITH POSSIBLE INTRAOPERATIVE CHOLANGIOGRAM;  Surgeon: Kieth Brightly Arta Bruce, MD;  Location: [facility];  Service: General;  Laterality: N/A;  . DILATION AND CURETTAGE OF UTERUS  2008  . LAPAROSCOPIC TUBAL LIGATION Bilateral 11/26/2018   Procedure: LAPAROSCOPIC TUBAL LIGATION;  Surgeon: Mora Bellman, MD;  Location: [place];  Service: Gynecology;  Laterality: Bilateral;  . TOOTH EXTRACTION     The following portions of the patient's history were reviewed and updated as appropriate: allergies, current medications, past family history, past medical history, past social history, past surgical history and problem list.   Health Maintenance:  Normal pap and negative  HRHPV on 2019.   Review of Systems:  Pertinent items noted in HPI and remainder of comprehensive ROS otherwise negative.  Physical Exam:   General:  Alert, oriented and cooperative.   Mental Status: Normal mood and affect perceived. Normal judgment and thought content.  Physical exam deferred due to nature of the encounter  Labs and Imaging Results for orders placed or performed in visit on 06/23/19 (from the past 336 hour(s))  Cervicovaginal ancillary only( [place])   Collection Time: 06/23/19  2:01 PM  Result Value Ref Range   Neisseria Gonorrhea Negative    Chlamydia Negative    Trichomonas Negative    Bacterial Vaginitis (gardnerella) Positive (A)    Candida Vaginitis Negative    Candida Glabrata Negative    Comment      Normal Reference Range Bacterial Vaginosis - Negative   Comment Normal Reference Ranger Chlamydia - Negative    Comment      Normal Reference Range Neisseria Gonorrhea - Negative   Comment Normal Reference Range Candida Species - Negative    Comment Normal Reference Range Candida Galbrata - Negative    Comment Normal Reference Range Trichomonas - Negative    No results found.    Assessment and Plan:     There are no diagnoses linked to this encounter.      I discussed the assessment and treatment plan with the patient. The patient was provided an opportunity to ask questions and all were answered. The patient agreed with the plan and demonstrated an understanding of the instructions.   The patient  was advised to call back or seek an in-person evaluation/go to the ED if the symptoms worsen or if the condition fails to improve as anticipated. We discussed OCP, Mirena, endometrial ablation and she will return for surgery consult I provided 15 minutes of non-face-to-face time during this encounter.   Scheryl Darter, MD [facility], [facility]

## 2019-06-25 NOTE — Patient Instructions (Signed)

## 2019-07-03 ENCOUNTER — Ambulatory Visit (HOSPITAL_COMMUNITY)
Admission: EM | Admit: 2019-07-03 | Discharge: 2019-07-03 | Disposition: A | Discharge status: Home / Self Care | Payer: Medicaid Other | Attending: Family Medicine | Admitting: Family Medicine

## 2019-07-03 ENCOUNTER — Other Ambulatory Visit: Payer: Self-pay

## 2019-07-03 DIAGNOSIS — G43909 Migraine, unspecified, not intractable, without status migrainosus: Secondary | ICD-10-CM | POA: Diagnosis present

## 2019-07-03 DIAGNOSIS — M7918 Myalgia, other site: Secondary | ICD-10-CM

## 2019-07-03 DIAGNOSIS — S161XXA Strain of muscle, fascia and tendon at neck level, initial encounter: Secondary | ICD-10-CM

## 2019-07-03 MED ORDER — TIZANIDINE HCL 4 MG PO TABS: 4.0000 mg | ORAL_TABLET | Freq: Four times a day (QID) | ORAL | 0 refills | Status: DC | PRN

## 2019-07-03 MED ORDER — HYDROCODONE-ACETAMINOPHEN 5-325 MG PO TABS: 1.0000 | ORAL_TABLET | Freq: Four times a day (QID) | ORAL | 0 refills | Status: DC | PRN
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
## 2019-07-03 NOTE — Discharge Instructions (Signed)
Home to rest Ice or heat to painful muscles Take ibuprofen 3 times a day with food for moderate pain Take tizanidine as muscle relaxer.  This is helpful at bedtime Take the hydrocodone pain medicine if needed for severe pain.  Do not take hydrocodone and drive Expect improvement over the next couple of days

## 2019-07-22 ENCOUNTER — Other Ambulatory Visit (HOSPITAL_COMMUNITY)
Admission: RE | Admit: 2019-07-22 | Discharge: 2019-07-22 | Disposition: A | Discharge status: Home / Self Care | Payer: Medicaid Other | Source: Ambulatory Visit | Attending: Obstetrics & Gynecology | Admitting: Obstetrics & Gynecology

## 2019-07-22 ENCOUNTER — Ambulatory Visit (INDEPENDENT_AMBULATORY_CARE_PROVIDER_SITE_OTHER): Payer: Medicaid Other | Admitting: Obstetrics & Gynecology

## 2019-07-22 ENCOUNTER — Encounter: Payer: Self-pay | Admitting: Obstetrics & Gynecology

## 2019-07-22 ENCOUNTER — Other Ambulatory Visit: Payer: Self-pay

## 2019-07-22 VITALS — BP 138/84 | HR 99 | Ht 68.0 in | Wt 268.0 lb

## 2019-07-22 DIAGNOSIS — N920 Excessive and frequent menstruation with regular cycle: Secondary | ICD-10-CM | POA: Insufficient documentation

## 2019-07-22 NOTE — Progress Notes (Signed)
Pt is in the office for consult for ablation.

## 2019-07-22 NOTE — Addendum Note (Signed)
Addended by: Natale Milch D on: 07/22/2019 05:04 PM   Modules accepted: Orders

## 2019-07-22 NOTE — Progress Notes (Signed)
Patient ID: Danielle [name], female   DOB: 10-12-1985, 34 y.o.   MRN: [id number] Patient desires surgical management with endometrial ablation.  The risks of surgery were discussed in detail with the patient including but not limited to: bleeding which may require transfusion or reoperation; infection which may require prolonged hospitalization or re-hospitalization and antibiotic therapy; injury to bowel, bladder, ureters and major vessels or other surrounding organs; formation of adhesions; need for additional procedures including laparotomy; thromboembolic phenomenon; incisional problems and other postoperative or anesthesia complications.  Patient was told that the likelihood that her condition and symptoms will be treated effectively with this surgical management was very high; the postoperative expectations were also discussed in detail. The patient also understands the alternative treatment options which were discussed in full. All questions were answered.  She was told that she will be contacted by our surgical scheduler regarding the time and date of her surgery; routine preoperative instructions will be given to her by the preoperative nursing team.   She is aware of need for preoperative COVID testing and subsequent quarantine from time of test to time of surgery; she will be given further preoperative instructions at that COVID screening visit.  Printed patient education handouts about the procedure were given to the patient to review at home. Endometrial biopsy  Patient given informed consent, signed copy in the chart, time out was performed. Appropriate time out taken. . The patient was placed in the lithotomy position and the cervix brought into view with sterile speculum.  Portio of cervix cleansed x 2 with betadine swabs.  The uterus was sounded for depth of 8 cm. A pipelle was introduced to into the uterus, suction created,  and an endometrial sample was obtained. All equipment was removed and  accounted for.  The patient tolerated the procedure well.    Patient given post procedure instructions. The patient will return in 2 weeks for results.

## 2019-07-22 NOTE — Addendum Note (Signed)
Addended by: Adam Phenix on: 07/22/2019 05:05 PM   Modules accepted: Orders

## 2019-07-24 LAB — SURGICAL PATHOLOGY

## 2019-07-30 ENCOUNTER — Other Ambulatory Visit: Payer: Self-pay

## 2019-07-30 MED ORDER — METRONIDAZOLE 500 MG PO TABS: 500.0000 mg | ORAL_TABLET | Freq: Two times a day (BID) | ORAL | 0 refills | Status: AC

## 2019-07-30 MED ORDER — METRONIDAZOLE 500 MG PO TABS: 500.0000 mg | ORAL_TABLET | Freq: Two times a day (BID) | ORAL | 0 refills | Status: DC

## 2019-08-01 ENCOUNTER — Other Ambulatory Visit (HOSPITAL_COMMUNITY): Payer: Medicaid Other

## 2019-08-05 ENCOUNTER — Ambulatory Visit (HOSPITAL_BASED_OUTPATIENT_CLINIC_OR_DEPARTMENT_OTHER): Admit: 2019-08-05 | Payer: Medicaid Other | Admitting: Obstetrics & Gynecology

## 2019-08-05 ENCOUNTER — Encounter (HOSPITAL_BASED_OUTPATIENT_CLINIC_OR_DEPARTMENT_OTHER): Payer: Self-pay

## 2019-08-05 SURGERY — DILATATION & CURETTAGE/HYSTEROSCOPY WITH NOVASURE ABLATION
Anesthesia: Choice

## 2019-08-20 ENCOUNTER — Other Ambulatory Visit: Payer: Self-pay | Admitting: Obstetrics & Gynecology

## 2019-08-24 ENCOUNTER — Other Ambulatory Visit: Payer: Self-pay

## 2019-08-24 ENCOUNTER — Encounter: Payer: Self-pay | Admitting: Family Medicine

## 2019-08-24 ENCOUNTER — Ambulatory Visit: Payer: Medicaid Other | Admitting: Family Medicine

## 2019-08-24 VITALS — BP 144/79 | HR 89 | Ht 68.0 in | Wt 269.0 lb

## 2019-08-24 DIAGNOSIS — G43709 Chronic migraine without aura, not intractable, without status migrainosus: Secondary | ICD-10-CM

## 2019-08-24 MED ORDER — EMGALITY 120 MG/ML ~~LOC~~ SOAJ: 120.0000 mg | SUBCUTANEOUS | 11 refills | Status: DC

## 2019-08-24 NOTE — Patient Instructions (Signed)
We will continue Emgality monthly. Continue rizatriptan as needed.   Stay well hydrated. Well balanced diet and regular exercise.   Follow up in 1 year, sooner if needed   Migraine Headache A migraine headache is a very strong throbbing pain on one side or both sides of your head. This type of headache can also cause other symptoms. It can last from 4 hours to 3 days. Talk with your doctor about what things may bring on (trigger) this condition. What are the causes? The exact cause of this condition is not known. This condition may be triggered or caused by:  Drinking alcohol.  Smoking.  Taking medicines, such as: ? Medicine used to treat chest pain (nitroglycerin). ? Birth control pills. ? Estrogen. ? Some blood pressure medicines.  Eating or drinking certain products.  Doing physical activity. Other things that may trigger a migraine headache include:  Having a menstrual period.  Pregnancy.  Hunger.  Stress.  Not getting enough sleep or getting too much sleep.  Weather changes.  Tiredness (fatigue). What increases the risk?  Being 11-33 years old.  Being female.  Having a family history of migraine headaches.  Being Caucasian.  Having depression or anxiety.  Being very overweight. What are the signs or symptoms?  A throbbing pain. This pain may: ? Happen in any area of the head, such as on one side or both sides. ? Make it hard to do daily activities. ? Get worse with physical activity. ? Get worse around bright lights or loud noises.  Other symptoms may include: ? Feeling sick to your stomach (nauseous). ? Vomiting. ? Dizziness. ? Being sensitive to bright lights, loud noises, or smells.  Before you get a migraine headache, you may get warning signs (an aura). An aura may include: ? Seeing flashing lights or having blind spots. ? Seeing bright spots, halos, or zigzag lines. ? Having tunnel vision or blurred vision. ? Having numbness or a  tingling feeling. ? Having trouble talking. ? Having weak muscles.  Some people have symptoms after a migraine headache (postdromal phase), such as: ? Tiredness. ? Trouble thinking (concentrating). How is this treated?  Taking medicines that: ? Relieve pain. ? Relieve the feeling of being sick to your stomach. ? Prevent migraine headaches.  Treatment may also include: ? Having acupuncture. ? Avoiding foods that bring on migraine headaches. ? Learning ways to control your body functions (biofeedback). ? Therapy to help you know and deal with negative thoughts (cognitive behavioral therapy). Follow these instructions at home: Medicines  Take over-the-counter and prescription medicines only as told by your doctor.  Ask your doctor if the medicine prescribed to you: ? Requires you to avoid driving or using heavy machinery. ? Can cause trouble pooping (constipation). You may need to take these steps to prevent or treat trouble pooping:  Drink enough fluid to keep your pee (urine) pale yellow.  Take over-the-counter or prescription medicines.  Eat foods that are high in fiber. These include beans, whole grains, and fresh fruits and vegetables.  Limit foods that are high in fat and sugar. These include fried or sweet foods. Lifestyle  Do not drink alcohol.  Do not use any products that contain nicotine or tobacco, such as cigarettes, e-cigarettes, and chewing tobacco. If you need help quitting, ask your doctor.  Get at least 8 hours of sleep every night.  Limit and deal with stress. General instructions      Keep a journal to find out what may  bring on your migraine headaches. For example, write down: ? What you eat and drink. ? How much sleep you get. ? Any change in what you eat or drink. ? Any change in your medicines.  If you have a migraine headache: ? Avoid things that make your symptoms worse, such as bright lights. ? It may help to lie down in a dark, quiet  room. ? Do not drive or use heavy machinery. ? Ask your doctor what activities are safe for you.  Keep all follow-up visits as told by your doctor. This is important. Contact a doctor if:  You get a migraine headache that is different or worse than others you have had.  You have more than 15 headache days in one month. Get help right away if:  Your migraine headache gets very bad.  Your migraine headache lasts longer than 72 hours.  You have a fever.  You have a stiff neck.  You have trouble seeing.  Your muscles feel weak or like you cannot control them.  You start to lose your balance a lot.  You start to have trouble walking.  You pass out (faint).  You have a seizure. Summary  A migraine headache is a very strong throbbing pain on one side or both sides of your head. These headaches can also cause other symptoms.  This condition may be treated with medicines and changes to your lifestyle.  Keep a journal to find out what may bring on your migraine headaches.  Contact a doctor if you get a migraine headache that is different or worse than others you have had.  Contact your doctor if you have more than 15 headache days in a month. This information is not intended to replace advice given to you by your health care provider. Make sure you discuss any questions you have with your health care provider. Document Revised: 05/16/2018 Document Reviewed: 03/06/2018 Elsevier Patient Education  [place].

## 2019-08-24 NOTE — Progress Notes (Addendum)
PATIENT: Danielle [name] DOB: 02/25/1985  REASON FOR VISIT: follow up HISTORY FROM: patient  Chief Complaint  Patient presents with  . Follow-up    rm 2 here for a migraine f/u.     HISTORY OF PRESENT ILLNESS: Today 08/24/19 Danielle Maiori Scaduto is a 34 y.o. female here today for follow up for migraines. She is doing well on Emgality. She was having 15 headache days per month with 10 of these being migrainous. Now having 2-3 headaches per month with maybe 1 per month being migrainous. She has taken rizatriptan maybe 2-3 times since March. She is feeling well and without concerns.   HISTORY: (copied from Dr Trevor MaceAhern's note on 04/16/2019)  HPI:  Danielle Maiori Berhow is a 34 y.o. female [facility] for migraines. Migraines started at the age of 34 and recently worsening since having son, she has 6 sons. Migraines start in the temples and across the forehead and sometimes behind her ears. Throbbing, pounding, liht and sound sensitivity. Movement makes it worse. No aura. No vision changes, no blurry vision. She has nausea, has vomited in the past. She wakes up with them and they are worse positionally like with bending over or supine. The migraines can last up to 24 hours and can be moderately severe or severe, 15 headache days a month and 10 migraine days a month. Going on for aout a year without relief. She denies constipation, She had sterilization last October. No other focal neurologic deficits, associated symptoms, inciting events or modifiable factors.  Reviewed notes, labs and imaging from outside physicians, which showed:  meds tried: zoloft, tizanidine, trazodone, zofran, labetalol, ketorolac tablet, ibuprofen, gabapentin, enalapril, benadryl, flexeril, amlodipine, tylenol,   CMP 09/10/2018 unremarkable  CT head 03/11/2010: showed No acute intracranial abnormalities including mass lesion or mass effect, hydrocephalus, extra-axial fluid collection, midline shift,  hemorrhage, or acute infarction, large ischemic events, normal (personally reviewed images)   REVIEW OF SYSTEMS: Out of a complete 14 system review of symptoms, the patient complains only of the following symptoms, headaches and all other reviewed systems are negative.  ALLERGIES: Allergies  Allergen Reactions  . Latex Hives and Rash    HOME MEDICATIONS: Outpatient Medications Prior to Visit  Medication Sig Dispense Refill  . ARIPiprazole ER (ABILIFY MAINTENA) 400 MG SRER injection Inject 400 mg into the muscle.    . enalapril (VASOTEC) 20 MG tablet Take 1 tablet (20 mg total) by mouth daily. 30 tablet 3  . ibuprofen (ADVIL) 600 MG tablet Take 1 tablet (600 mg total) by mouth every 6 (six) hours as needed. (Patient not taking: Reported on 07/22/2019) 60 tablet 3  . rizatriptan (MAXALT-MLT) 10 MG disintegrating tablet Take 1 tablet (10 mg total) by mouth as needed for migraine. May repeat in 2 hours if needed (Patient not taking: Reported on 07/22/2019) 9 tablet 11  . ABILIFY MAINTENA 400 MG PRSY prefilled syringe SMARTSIG:1 Each IM Once a Month (Patient not taking: Reported on 07/22/2019)    . Galcanezumab-gnlm (EMGALITY) 120 MG/ML SOAJ Inject 240 mg into the skin every 30 (thirty) days. First month dose 2 pen 0  . Galcanezumab-gnlm (EMGALITY) 120 MG/ML SOAJ Inject 120 mg into the skin every 30 (thirty) days. 1 pen 11  . HYDROcodone-acetaminophen (NORCO/VICODIN) 5-325 MG tablet Take 1-2 tablets by mouth every 6 (six) hours as needed. (Patient not taking: Reported on 07/22/2019) 10 tablet 0  . tiZANidine (ZANAFLEX) 4 MG tablet Take 1-2 tablets (4-8 mg total) by mouth every  6 (six) hours as needed for muscle spasms. (Patient not taking: Reported on 07/22/2019) 21 tablet 0   No facility-administered medications prior to visit.    PAST MEDICAL HISTORY: Past Medical History:  Diagnosis Date  . Anxiety   . Depression   . Dizzy spells   . Endometriosis   . Foot fracture, right 2011  .  Headache(784.0)   . Hypertension    on meds now  . Hypothyroidism   . Leg fracture, right 1998  . Pregnancy induced hypertension 2016  . PTSD (post-traumatic stress disorder)   . Trauma   . Vaginal Pap smear, abnormal 2017   did not f/u as instructed    PAST SURGICAL HISTORY: Past Surgical History:  Procedure Laterality Date  . CHOLECYSTECTOMY N/A 04/26/2018   Procedure: LAPAROSCOPIC CHOLECYSTECTOMY WITH POSSIBLE INTRAOPERATIVE CHOLANGIOGRAM;  Surgeon: Sheliah Hatch De Blanch, MD;  Location: [facility];  Service: General;  Laterality: N/A;  . DILATION AND CURETTAGE OF UTERUS  2008  . LAPAROSCOPIC TUBAL LIGATION Bilateral 11/26/2018   Procedure: LAPAROSCOPIC TUBAL LIGATION;  Surgeon: Catalina Antigua, MD;  Location: [facility];  Service: Gynecology;  Laterality: Bilateral;  . TOOTH EXTRACTION      FAMILY HISTORY: Family History  Problem Relation Age of Onset  . Healthy Mother   . Diabetes Father   . Hypertension Father   . Diabetes Maternal Grandmother   . Cancer Maternal Grandmother   . Diabetes Maternal Grandfather   . Diabetes Paternal Grandmother   . Diabetes Paternal Grandfather   . Other Cousin        sickle cell disease  . Arthritis Other   . Asthma Other   . Early death Other        uncle-suicide  . Cataracts Other   . Congestive Heart Failure Other   . Hyperlipidemia Other   . Hypertension Other   . Stroke Other   . Anesthesia problems Neg Hx   . Migraines Neg Hx     SOCIAL HISTORY: Social History   Socioeconomic History  . Marital status: Divorced    Spouse name: Not on file  . Number of children: 6  . Years of education: Not on file  . Highest education level: High school graduate  Occupational History  . Occupation: Conservation officer, nature  Tobacco Use  . Smoking status: Never Smoker  . Smokeless tobacco: Never Used  Vaping Use  . Vaping Use: Never used  Substance and Sexual Activity  . Alcohol use: Not Currently    Alcohol/week: 0.0 standard  drinks    Comment: occas.   . Drug use: Not Currently    Comment: no longer, last was first wk Nov  . Sexual activity: Yes    Birth control/protection: Surgical  Other Topics Concern  . Not on file  Social History Narrative   Lives at home with her children   Right handed   Caffeine: 0-1 cups/day   Social Determinants of Health   Financial Resource Strain: Low Risk   . Difficulty of Paying Living Expenses: Not hard at all  Food Insecurity: No Food Insecurity  . Worried About Programme researcher, broadcasting/film/video in the Last Year: Never true  . Ran Out of Food in the Last Year: Never true  Transportation Needs: No Transportation Needs  . Lack of Transportation (Medical): No  . Lack of Transportation (Non-Medical): No  Physical Activity: Insufficiently Active  . Days of Exercise per Week: 2 days  . Minutes of Exercise per Session: 30 min  Stress:  No Stress Concern Present  . Feeling of Stress : Not at all  Social Connections: Socially Isolated  . Frequency of Communication with Friends and Family: More than three times a week  . Frequency of Social Gatherings with Friends and Family: Three times a week  . Attends Religious Services: Never  . Active Member of Clubs or Organizations: No  . Attends Banker Meetings: Never  . Marital Status: Divorced  Catering manager Violence: Not At Risk  . Fear of Current or Ex-Partner: No  . Emotionally Abused: No  . Physically Abused: No  . Sexually Abused: No      PHYSICAL EXAM  Vitals:   08/24/19 1418  BP: (!) 144/79  Pulse: 89  Weight: 269 lb (122 kg)  Height: 5\' 8"  (1.727 m)   Body mass index is 40.9 kg/m.  Generalized: Well developed, in no acute distress  Cardiology: normal rate and rhythm, no murmur noted Respiratory: clear to auscultation bilaterally  Neurological examination  Mentation: Alert oriented to time, place, history taking. Follows all commands speech and language fluent Cranial nerve II-XII: Pupils were equal  round reactive to light. Extraocular movements were full, visual field were full  Motor: The motor testing reveals 5 over 5 strength of all 4 extremities. Good symmetric motor tone is noted throughout.  Gait and station: Gait is normal.  DIAGNOSTIC DATA (LABS, IMAGING, TESTING) - I reviewed patient records, labs, notes, testing and imaging myself where available.  No flowsheet data found.   Lab Results  Component Value Date   WBC 7.2 04/16/2019   HGB 9.6 (L) 04/16/2019   HCT 33.0 (L) 04/16/2019   MCV 68 (L) 04/16/2019   PLT 276 04/16/2019      Component Value Date/Time   NA 140 04/16/2019 1346   K 4.1 04/16/2019 1346   CL 104 04/16/2019 1346   CO2 23 04/16/2019 1346   GLUCOSE 87 04/16/2019 1346   GLUCOSE 99 09/10/2018 0955   BUN 6 04/16/2019 1346   CREATININE 0.55 (L) 04/16/2019 1346   CREATININE 0.65 04/08/2015 1549   CALCIUM 9.2 04/16/2019 1346   PROT 7.1 04/16/2019 1346   ALBUMIN 4.1 04/16/2019 1346   AST 16 04/16/2019 1346   ALT 12 04/16/2019 1346   ALKPHOS 87 04/16/2019 1346   BILITOT <0.2 04/16/2019 1346   GFRNONAA 123 04/16/2019 1346   GFRNONAA >89 02/18/2014 1212   GFRAA 142 04/16/2019 1346   GFRAA >89 02/18/2014 1212   No results found for: CHOL, HDL, LDLCALC, LDLDIRECT, TRIG, CHOLHDL Lab Results  Component Value Date   HGBA1C 5.4 10/08/2017   No results found for: VITAMINB12 Lab Results  Component Value Date   TSH 1.280 04/16/2019       ASSESSMENT AND PLAN 34 y.o. year old female  has a past medical history of Anxiety, Depression, Dizzy spells, Endometriosis, Foot fracture, right (2011), Headache(784.0), Hypertension, Hypothyroidism, Leg fracture, right (1998), Pregnancy induced hypertension (2016), PTSD (post-traumatic stress disorder), Trauma, and Vaginal Pap smear, abnormal (2017). here with     ICD-10-CM   1. Chronic migraine without aura without status migrainosus, not intractable  G43.709      Toniette is doing great. She is tolerating Emgality  and reports significant improvement in headache frequency and intensity. We will continue Emgality monthly and rizatriptan for abortive therapy. She was encouraged to continue healthy lifestyle habits. She will follow up with me in 1 year, sooner if needed.   No orders of the defined types were placed in  this encounter.    Meds ordered this encounter  Medications  . Galcanezumab-gnlm (EMGALITY) 120 MG/ML SOAJ    Sig: Inject 120 mg into the skin every 30 (thirty) days.    Dispense:  1 pen    Refill:  11    Order Specific Question:   Supervising Provider    Answer:   Anson Fret [id number]      I spent 15 minutes with the patient. 50% of this time was spent counseling and educating patient on plan of care and medications.    Shawnie Dapper, FNP-C 08/24/2019, 3:11 PM [facility] [address] [place], [place] [address] [phone]  Made any corrections needed, and agree with history, physical, neuro exam,assessment and plan as stated.     Naomie Dean, MD [facility]

## 2019-09-24 ENCOUNTER — Other Ambulatory Visit (HOSPITAL_COMMUNITY)
Admission: RE | Admit: 2019-09-24 | Discharge: 2019-09-24 | Disposition: A | Discharge status: Home / Self Care | Payer: Medicaid Other | Source: Ambulatory Visit | Attending: Obstetrics and Gynecology | Admitting: Obstetrics and Gynecology

## 2019-09-24 ENCOUNTER — Other Ambulatory Visit: Payer: Self-pay

## 2019-09-24 ENCOUNTER — Ambulatory Visit (INDEPENDENT_AMBULATORY_CARE_PROVIDER_SITE_OTHER): Payer: Medicaid Other | Admitting: *Deleted

## 2019-09-24 DIAGNOSIS — N760 Acute vaginitis: Secondary | ICD-10-CM

## 2019-09-24 NOTE — Progress Notes (Signed)
SUBJECTIVE:  34 y.o. female complains of vaginal discharge for several week(s). Denies abnormal vaginal bleeding or significant pelvic pain or fever. No UTI symptoms. Denies history of known exposure to STD.  No LMP recorded.  OBJECTIVE:  She appears well, afebrile. Urine dipstick: not done.  ASSESSMENT:  Vaginal Discharge  Vaginal Itch/Irritation   PLAN:  BVAG, CVAG probe sent to lab, pt declines need for std testing. Treatment: To be determined once lab results are received ROV prn if symptoms persist or worsen.

## 2019-09-24 NOTE — Progress Notes (Signed)
Patient was assessed and managed by nursing staff during this encounter. I have reviewed the chart and agree with the documentation and plan.  Sharyon Cable, CNM 09/24/2019 3:56 PM

## 2019-09-28 ENCOUNTER — Other Ambulatory Visit: Payer: Self-pay | Admitting: Obstetrics and Gynecology

## 2019-09-28 ENCOUNTER — Encounter (HOSPITAL_BASED_OUTPATIENT_CLINIC_OR_DEPARTMENT_OTHER): Payer: Self-pay | Admitting: Obstetrics & Gynecology

## 2019-09-28 ENCOUNTER — Other Ambulatory Visit: Payer: Self-pay | Admitting: Certified Nurse Midwife

## 2019-09-28 DIAGNOSIS — B373 Candidiasis of vulva and vagina: Secondary | ICD-10-CM

## 2019-09-28 DIAGNOSIS — B3731 Acute candidiasis of vulva and vagina: Secondary | ICD-10-CM

## 2019-09-28 LAB — CERVICOVAGINAL ANCILLARY ONLY
Bacterial Vaginitis (gardnerella): NEGATIVE
Candida Glabrata: NEGATIVE
Candida Vaginitis: POSITIVE — AB
Comment: NEGATIVE
Comment: NEGATIVE
Comment: NEGATIVE

## 2019-09-28 MED ORDER — FLUCONAZOLE 150 MG PO TABS: 150.0000 mg | ORAL_TABLET | Freq: Every day | ORAL | 1 refills | Status: DC

## 2019-09-29 ENCOUNTER — Encounter (HOSPITAL_BASED_OUTPATIENT_CLINIC_OR_DEPARTMENT_OTHER): Payer: Self-pay | Admitting: Obstetrics & Gynecology

## 2019-10-01 ENCOUNTER — Telehealth: Payer: Self-pay

## 2019-10-01 NOTE — Telephone Encounter (Signed)
Called patient, no answer, left voicemail that her surgery location has changed. Advised her to call the office back if she had any questions

## 2019-10-02 ENCOUNTER — Other Ambulatory Visit (HOSPITAL_COMMUNITY)
Admission: RE | Admit: 2019-10-02 | Discharge: 2019-10-02 | Disposition: A | Discharge status: Home / Self Care | Payer: Medicaid Other | Source: Ambulatory Visit | Attending: Obstetrics & Gynecology | Admitting: Obstetrics & Gynecology

## 2019-10-02 DIAGNOSIS — Z01812 Encounter for preprocedural laboratory examination: Secondary | ICD-10-CM | POA: Diagnosis not present

## 2019-10-02 DIAGNOSIS — Z20822 Contact with and (suspected) exposure to covid-19: Secondary | ICD-10-CM | POA: Insufficient documentation

## 2019-10-02 LAB — SARS CORONAVIRUS 2 (TAT 6-24 HRS): SARS Coronavirus 2: NEGATIVE

## 2019-10-05 ENCOUNTER — Encounter (HOSPITAL_BASED_OUTPATIENT_CLINIC_OR_DEPARTMENT_OTHER): Payer: Self-pay | Admitting: Obstetrics & Gynecology

## 2019-10-05 ENCOUNTER — Other Ambulatory Visit: Payer: Self-pay

## 2019-10-05 NOTE — Progress Notes (Signed)
Spoke w/ via phone for pre-op interview--- PT Lab needs dos---- Istat , Urine preg              Lab results------  no COVID test ------ 10-02-2019 results in epic Arrive at ------- 0530 NPO after MN NO Solid Food.  Clear liquids from MN until--- 0430 Medications to take morning of surgery ----- NONE Diabetic medication ----- n/a Patient Special Instructions ----- n/a Pre-Op special Istructions ----- Pt moved facility location from [facility] to [facility].  Pt history done 09-29-2019 by Jeanie Sewer RN @[facility] .  Pt given [address] address. Patient verbalized understanding of instructions that were given at this phone interview. Patient denies shortness of breath, chest pain, fever, cough at this phone interview.

## 2019-10-06 ENCOUNTER — Ambulatory Visit (HOSPITAL_BASED_OUTPATIENT_CLINIC_OR_DEPARTMENT_OTHER): Payer: Medicaid Other | Admitting: Anesthesiology

## 2019-10-06 ENCOUNTER — Encounter (HOSPITAL_BASED_OUTPATIENT_CLINIC_OR_DEPARTMENT_OTHER): Payer: Self-pay | Admitting: Obstetrics & Gynecology

## 2019-10-06 ENCOUNTER — Other Ambulatory Visit: Payer: Self-pay

## 2019-10-06 ENCOUNTER — Ambulatory Visit (HOSPITAL_BASED_OUTPATIENT_CLINIC_OR_DEPARTMENT_OTHER)
Admission: RE | Admit: 2019-10-06 | Discharge: 2019-10-06 | Disposition: A | Discharge status: Home / Self Care | Payer: Medicaid Other | Attending: Obstetrics & Gynecology | Admitting: Obstetrics & Gynecology

## 2019-10-06 ENCOUNTER — Encounter (HOSPITAL_BASED_OUTPATIENT_CLINIC_OR_DEPARTMENT_OTHER)
Admission: RE | Disposition: A | Discharge status: Home / Self Care | Payer: Self-pay | Source: Home / Self Care | Attending: Obstetrics & Gynecology

## 2019-10-06 DIAGNOSIS — F4310 Post-traumatic stress disorder, unspecified: Secondary | ICD-10-CM | POA: Insufficient documentation

## 2019-10-06 DIAGNOSIS — I10 Essential (primary) hypertension: Secondary | ICD-10-CM | POA: Insufficient documentation

## 2019-10-06 DIAGNOSIS — Z6841 Body Mass Index (BMI) 40.0 and over, adult: Secondary | ICD-10-CM | POA: Insufficient documentation

## 2019-10-06 DIAGNOSIS — F329 Major depressive disorder, single episode, unspecified: Secondary | ICD-10-CM | POA: Diagnosis not present

## 2019-10-06 DIAGNOSIS — F419 Anxiety disorder, unspecified: Secondary | ICD-10-CM | POA: Insufficient documentation

## 2019-10-06 DIAGNOSIS — E039 Hypothyroidism, unspecified: Secondary | ICD-10-CM | POA: Diagnosis not present

## 2019-10-06 DIAGNOSIS — Z79899 Other long term (current) drug therapy: Secondary | ICD-10-CM | POA: Diagnosis not present

## 2019-10-06 DIAGNOSIS — N938 Other specified abnormal uterine and vaginal bleeding: Secondary | ICD-10-CM

## 2019-10-06 DIAGNOSIS — N920 Excessive and frequent menstruation with regular cycle: Secondary | ICD-10-CM

## 2019-10-06 HISTORY — DX: Excessive and frequent menstruation with regular cycle: N92.0

## 2019-10-06 HISTORY — DX: Personal history of other complications of pregnancy, childbirth and the puerperium: Z87.59

## 2019-10-06 HISTORY — PX: DILITATION & CURRETTAGE/HYSTROSCOPY WITH NOVASURE ABLATION: SHX5568

## 2019-10-06 HISTORY — DX: Personal history of other diseases of the female genital tract: Z87.42

## 2019-10-06 LAB — POCT I-STAT, CHEM 8
BUN: 8 mg/dL (ref 6–20)
Calcium, Ion: 1.26 mmol/L (ref 1.15–1.40)
Chloride: 105 mmol/L (ref 98–111)
Creatinine, Ser: 0.5 mg/dL (ref 0.44–1.00)
Glucose, Bld: 96 mg/dL (ref 70–99)
HCT: 31 % — ABNORMAL LOW (ref 36.0–46.0)
Hemoglobin: 10.5 g/dL — ABNORMAL LOW (ref 12.0–15.0)
Potassium: 3.5 mmol/L (ref 3.5–5.1)
Sodium: 143 mmol/L (ref 135–145)
TCO2: 25 mmol/L (ref 22–32)

## 2019-10-06 LAB — POCT PREGNANCY, URINE: Preg Test, Ur: NEGATIVE

## 2019-10-06 SURGERY — DILATATION & CURETTAGE/HYSTEROSCOPY WITH NOVASURE ABLATION
Anesthesia: General | Site: Vagina

## 2019-10-06 MED ORDER — ACETAMINOPHEN 500 MG PO TABS
1000.0000 mg | ORAL_TABLET | Freq: Once | ORAL | Status: AC
  Administered 2019-10-06: 1000 mg via ORAL

## 2019-10-06 MED ORDER — ACETAMINOPHEN 500 MG PO TABS
ORAL_TABLET | ORAL | Status: AC
  Filled 2019-10-06: qty 2

## 2019-10-06 MED ORDER — PROPOFOL 10 MG/ML IV BOLUS
INTRAVENOUS | Status: AC
  Filled 2019-10-06: qty 40

## 2019-10-06 MED ORDER — MIDAZOLAM HCL 5 MG/5ML IJ SOLN
INTRAMUSCULAR | Status: DC | PRN
  Administered 2019-10-06: 2 mg via INTRAVENOUS

## 2019-10-06 MED ORDER — OXYCODONE-ACETAMINOPHEN 5-325 MG PO TABS: 1.0000 | ORAL_TABLET | Freq: Four times a day (QID) | ORAL | 0 refills | Status: DC | PRN

## 2019-10-06 MED ORDER — LIDOCAINE 2% (20 MG/ML) 5 ML SYRINGE
INTRAMUSCULAR | Status: AC
  Filled 2019-10-06: qty 5

## 2019-10-06 MED ORDER — IBUPROFEN 600 MG PO TABS: 600.0000 mg | ORAL_TABLET | Freq: Four times a day (QID) | ORAL | 1 refills | Status: DC | PRN

## 2019-10-06 MED ORDER — DEXAMETHASONE SODIUM PHOSPHATE 10 MG/ML IJ SOLN
INTRAMUSCULAR | Status: AC
  Filled 2019-10-06: qty 1

## 2019-10-06 MED ORDER — LIDOCAINE 2% (20 MG/ML) 5 ML SYRINGE
INTRAMUSCULAR | Status: DC | PRN
  Administered 2019-10-06: 80 mg via INTRAVENOUS

## 2019-10-06 MED ORDER — BUPIVACAINE HCL (PF) 0.5 % IJ SOLN
INTRAMUSCULAR | Status: DC | PRN
  Administered 2019-10-06: 10 mL

## 2019-10-06 MED ORDER — LACTATED RINGERS IV SOLN: INTRAVENOUS | Status: DC

## 2019-10-06 MED ORDER — FENTANYL CITRATE (PF) 100 MCG/2ML IJ SOLN: 25.0000 ug | INTRAMUSCULAR | Status: DC | PRN

## 2019-10-06 MED ORDER — DEXAMETHASONE SODIUM PHOSPHATE 10 MG/ML IJ SOLN
INTRAMUSCULAR | Status: DC | PRN
  Administered 2019-10-06: 10 mg via INTRAVENOUS

## 2019-10-06 MED ORDER — ONDANSETRON HCL 4 MG/2ML IJ SOLN
INTRAMUSCULAR | Status: DC | PRN
  Administered 2019-10-06: 4 mg via INTRAVENOUS

## 2019-10-06 MED ORDER — MIDAZOLAM HCL 2 MG/2ML IJ SOLN
INTRAMUSCULAR | Status: AC
  Filled 2019-10-06: qty 2

## 2019-10-06 MED ORDER — ONDANSETRON HCL 4 MG/2ML IJ SOLN
INTRAMUSCULAR | Status: AC
  Filled 2019-10-06: qty 2

## 2019-10-06 MED ORDER — PROPOFOL 10 MG/ML IV BOLUS
INTRAVENOUS | Status: DC | PRN
  Administered 2019-10-06: 200 mg via INTRAVENOUS

## 2019-10-06 MED ORDER — FENTANYL CITRATE (PF) 100 MCG/2ML IJ SOLN
INTRAMUSCULAR | Status: DC | PRN
Start: 2019-10-06 — End: 2019-10-06
  Administered 2019-10-06: 25 ug via INTRAVENOUS
  Administered 2019-10-06: 50 ug via INTRAVENOUS
  Administered 2019-10-06: 25 ug via INTRAVENOUS

## 2019-10-06 MED ORDER — FENTANYL CITRATE (PF) 100 MCG/2ML IJ SOLN
INTRAMUSCULAR | Status: AC
  Filled 2019-10-06: qty 2

## 2019-10-06 SURGICAL SUPPLY — 15 items
ABLATOR SURESOUND NOVASURE (ABLATOR) ×2 IMPLANT
CATH ROBINSON RED A/P 16FR (CATHETERS) ×1 IMPLANT
CATH SILICONE 16FRX5CC (CATHETERS) ×1 IMPLANT
DILATOR CANAL MILEX (MISCELLANEOUS) IMPLANT
GAUZE 4X4 16PLY RFD (DISPOSABLE) ×1 IMPLANT
GLOVE BIO SURGEON STRL SZ 6.5 (GLOVE) ×2 IMPLANT
GLOVE BIOGEL PI IND STRL 7.0 (GLOVE) ×2 IMPLANT
GLOVE BIOGEL PI INDICATOR 7.0 (GLOVE) ×3
GLOVE SURG SS PI 6.5 STRL IVOR (GLOVE) ×1 IMPLANT
GOWN STRL REUS W/ TWL LRG LVL3 (GOWN DISPOSABLE) ×2 IMPLANT
GOWN STRL REUS W/TWL LRG LVL3 (GOWN DISPOSABLE) ×5 IMPLANT
KIT PROCEDURE FLUENT (KITS) ×1 IMPLANT
KIT TURNOVER CYSTO (KITS) ×2 IMPLANT
PACK VAGINAL MINOR WOMEN LF (CUSTOM PROCEDURE TRAY) ×2 IMPLANT
PAD OB MATERNITY 4.3X12.25 (PERSONAL CARE ITEMS) ×2 IMPLANT

## 2019-10-06 NOTE — Anesthesia Postprocedure Evaluation (Signed)
Anesthesia Post Note  Patient: Danielle [name]  Procedure(s) Performed: NOVASURE ABLATION (N/A Vagina )     Patient location during evaluation: PACU Anesthesia Type: General Level of consciousness: awake and alert Pain management: pain level controlled Vital Signs Assessment: post-procedure vital signs reviewed and stable Respiratory status: spontaneous breathing, nonlabored ventilation, respiratory function stable and patient connected to nasal cannula oxygen Cardiovascular status: blood pressure returned to baseline and stable Postop Assessment: no apparent nausea or vomiting Anesthetic complications: no   No complications documented.  Last Vitals:  Vitals:   10/06/19 0845 10/06/19 0850  BP:  (!) 133/46  Pulse: 71   Resp: 17   Temp: 36.7 C   SpO2: 100%     Last Pain:  Vitals:   10/06/19 0845  TempSrc:   PainSc: 0-No pain                 [name]

## 2019-10-06 NOTE — Transfer of Care (Signed)
Immediate Anesthesia Transfer of Care Note  Patient: Danielle [name]  Procedure(s) Performed: NOVASURE ABLATION (N/A Vagina )  Patient Location: PACU  Anesthesia Type:General  Level of Consciousness: awake, drowsy and responds to stimulation  Airway & Oxygen Therapy: Patient Spontanous Breathing and Patient connected to nasal cannula oxygen  Post-op Assessment: Report given to RN and Post -op Vital signs reviewed and stable  Post vital signs: Reviewed and stable  Last Vitals:  Vitals Value Taken Time  BP    Temp    Pulse    Resp    SpO2      Last Pain:  Vitals:   10/06/19 0539  TempSrc: Oral  PainSc: 0-No pain         Complications: No complications documented.

## 2019-10-06 NOTE — Anesthesia Preprocedure Evaluation (Signed)
Anesthesia Evaluation  Patient identified by MRN, date of birth, ID band Patient awake    Reviewed: Allergy & Precautions, NPO status , Patient's Chart, lab work & pertinent test results  Airway Mallampati: II  TM Distance: >3 FB Neck ROM: Full    Dental no notable dental hx.    Pulmonary neg pulmonary ROS,    Pulmonary exam normal breath sounds clear to auscultation       Cardiovascular hypertension, negative cardio ROS Normal cardiovascular exam Rhythm:Regular Rate:Normal     Neuro/Psych  Headaches, PSYCHIATRIC DISORDERS Anxiety Depression    GI/Hepatic negative GI ROS, Neg liver ROS,   Endo/Other  Hypothyroidism Morbid obesity (BMI 40)  Renal/GU negative Renal ROS  negative genitourinary   Musculoskeletal negative musculoskeletal ROS (+)   Abdominal   Peds  Hematology negative hematology ROS (+)   Anesthesia Other Findings   Reproductive/Obstetrics                             Anesthesia Physical Anesthesia Plan  ASA: III  Anesthesia Plan: General   Post-op Pain Management:    Induction: Intravenous  PONV Risk Score and Plan: 3 and Ondansetron, Dexamethasone and Midazolam  Airway Management Planned: LMA  Additional Equipment:   Intra-op Plan:   Post-operative Plan: Extubation in OR  Informed Consent: I have reviewed the patients History and Physical, chart, labs and discussed the procedure including the risks, benefits and alternatives for the proposed anesthesia with the patient or authorized representative who has indicated his/her understanding and acceptance.     Dental advisory given  Plan Discussed with: CRNA  Anesthesia Plan Comments:         Anesthesia Quick Evaluation

## 2019-10-06 NOTE — Anesthesia Procedure Notes (Signed)
Procedure Name: LMA Insertion Date/Time: 10/06/2019 7:40 AM Performed by: Elyn Peers, CRNA Pre-anesthesia Checklist: Patient identified, Emergency Drugs available, Suction available, Patient being monitored and Timeout performed Patient Re-evaluated:Patient Re-evaluated prior to induction Oxygen Delivery Method: Circle system utilized Preoxygenation: Pre-oxygenation with 100% oxygen Induction Type: IV induction Ventilation: Mask ventilation without difficulty LMA: LMA inserted LMA Size: 4.0 Number of attempts: 1 Placement Confirmation: positive ETCO2 and breath sounds checked- equal and bilateral Tube secured with: Tape Dental Injury: Teeth and Oropharynx as per pre-operative assessment

## 2019-10-06 NOTE — H&P (Signed)
Danielle [name] is an 34 y.o. female. [id number] Patient's last menstrual period was 09/26/2019. Patient has heavy menses and is scheduled for NovaSure endometrial ablation. S/P BTL last year, EMBx was normal   Pertinent Gynecological History:  Contraception: tubal ligation DES exposure: denies Blood transfusions: none Sexually transmitted diseases: no past history *   Menstrual History:  Patient's last menstrual period was 09/26/2019.    Past Medical History:  Diagnosis Date  . Anxiety   . Depression   . Endometriosis   . Headache(784.0)   . History of abnormal cervical Pap smear   . History of pregnancy induced hypertension   . Hypertension   . Hypothyroidism   . Menorrhagia   . PTSD (post-traumatic stress disorder)     Past Surgical History:  Procedure Laterality Date  . CHOLECYSTECTOMY N/A 04/26/2018   Procedure: LAPAROSCOPIC CHOLECYSTECTOMY WITH POSSIBLE INTRAOPERATIVE CHOLANGIOGRAM;  Surgeon: Sheliah Hatch De Blanch, MD;  Location: [facility];  Service: General;  Laterality: N/A;  . COMBINED LAPAROSCOPY W/ HYSTEROSCOPY  04-11-2006   @[facility]   . LAPAROSCOPIC TUBAL LIGATION Bilateral 11/26/2018   Procedure: LAPAROSCOPIC TUBAL LIGATION;  Surgeon: 11/28/2018, MD;  Location: [facility];  Service: Gynecology;  Laterality: Bilateral;  . TOOTH EXTRACTION      Family History  Problem Relation Age of Onset  . Healthy Mother   . Diabetes Father   . Hypertension Father   . Diabetes Maternal Grandmother   . Cancer Maternal Grandmother   . Diabetes Maternal Grandfather   . Diabetes Paternal Grandmother   . Diabetes Paternal Grandfather   . Other Cousin        sickle cell disease  . Arthritis Other   . Asthma Other   . Early death Other        uncle-suicide  . Cataracts Other   . Congestive Heart Failure Other   . Hyperlipidemia Other   . Hypertension Other   . Stroke Other   . Anesthesia problems Neg Hx   . Migraines Neg Hx     Social History:  reports  that she has never smoked. She has never used smokeless tobacco. She reports previous alcohol use. She reports previous drug use.  Allergies:  Allergies  Allergen Reactions  . Latex Hives and Rash    Medications Prior to Admission  Medication Sig Dispense Refill Last Dose  . amLODipine (NORVASC) 5 MG tablet Take 5 mg by mouth daily.   10/05/2019 at Unknown time  . ARIPiprazole ER (ABILIFY MAINTENA) 400 MG SRER injection Inject 400 mg into the muscle.   Past Month at Unknown time  . fluconazole (DIFLUCAN) 150 MG tablet Take 1 tablet (150 mg total) by mouth daily. 1 tablet 1 Past Week at Unknown time  . Galcanezumab-gnlm (EMGALITY) 120 MG/ML SOAJ Inject 120 mg into the skin every 30 (thirty) days. 1 pen 11 Past Month at Unknown time  . rizatriptan (MAXALT-MLT) 10 MG disintegrating tablet Take 1 tablet (10 mg total) by mouth as needed for migraine. May repeat in 2 hours if needed 9 tablet 11 Past Week at Unknown time    Review of Systems  Constitutional: Negative.   Respiratory: Negative.   Gastrointestinal: Negative.   Genitourinary: Negative.     Blood pressure (!) 150/79, pulse 86, temperature 97.9 F (36.6 C), temperature source Oral, resp. rate 15, height 5\' 8"  (1.727 m), weight 120.5 kg, last menstrual period 09/26/2019, SpO2 100 %, not currently breastfeeding. Physical Exam Vitals reviewed.  Cardiovascular:     Rate and  Rhythm: Normal rate.  Pulmonary:     Effort: Pulmonary effort is normal.  Abdominal:     General: There is no distension.  Skin:    General: Skin is warm and dry.  Neurological:     General: No focal deficit present.     Mental Status: She is alert.  Psychiatric:        Mood and Affect: Mood normal.     Results for orders placed or performed during the hospital encounter of 10/06/19 (from the past 24 hour(s))  Pregnancy, urine POC     Status: None   Collection Time: 10/06/19  5:40 AM  Result Value Ref Range   Preg Test, Ur NEGATIVE NEGATIVE  I-STAT,  chem 8     Status: Abnormal   Collection Time: 10/06/19  6:28 AM  Result Value Ref Range   Sodium 143 135 - 145 mmol/L   Potassium 3.5 3.5 - 5.1 mmol/L   Chloride 105 98 - 111 mmol/L   BUN 8 6 - 20 mg/dL   Creatinine, Ser 6.07 0.44 - 1.00 mg/dL   Glucose, Bld 96 70 - 99 mg/dL   Calcium, Ion 3.71 0.62 - 1.40 mmol/L   TCO2 25 22 - 32 mmol/L   Hemoglobin 10.5 (L) 12.0 - 15.0 g/dL   HCT 69.4 (L) 36 - 46 %   No results found.  Assessment/Plan: Patient desires surgical management with endometrial ablation.  The risks of surgery were discussed in detail with the patient including but not limited to: bleeding which may require transfusion or reoperation; infection which may require prolonged hospitalization or re-hospitalization and antibiotic therapy; injury to bowel, bladder, ureters and major vessels or other surrounding organs; formation of adhesions;need for additional procedures including laparotomy; thromboembolic phenomenon; incisional problems and other postoperative or anesthesia complications.  Patient was told that the likelihood that her condition and symptoms will be treated effectively with this surgical management was very high; the postoperative expectations were also discussed in detail. The patient also understands the alternative treatment options which were discussed in full. All questions were answered.  Scheryl Darter 10/06/2019, 7:28 AM

## 2019-10-06 NOTE — Discharge Instructions (Signed)
Endometrial Ablation, Care After This sheet gives you information about how to care for yourself after your procedure. Your health care provider may also give you more specific instructions. If you have problems or questions, contact your health care provider. What can I expect after the procedure? After the procedure, it is common to have:  A need to urinate more frequently than usual for the first 24 hours.  Cramps similar to menstrual cramps. These may last for 1-2 days.  Thin, watery vaginal discharge that is light pink or brown in color. This may last a few weeks. Discharge will be heavy for the first few days after your procedure. You may need to wear a sanitary pad.  Nausea.  Vaginal bleeding for 4-6 weeks after the procedure, as tissue healing occurs. Follow these instructions at home: Activity  Do not drive for 24 hours if you were given amedicine to help you relax (sedative) during your procedure.  Do not have sex or put anything into your vagina until your health care provider approves.  Do not lift anything that is heavier than 10 lb (4.5 kg), or the limit that you are told, until your health care provider says that it is safe.  Return to your normal activities as told by your health care provider. Ask your health care provider what activities are safe for you. General instructions   Take over-the-counter and prescription medicines only as told by your health care provider.  Do not take baths, swim, or use a hot tub until your health care provider approves. You will be able to take showers.  Check your vaginal area every day for signs of infection. Check for: ? Redness, swelling, or pain. ? More discharge or blood, instead of less. ? Bad-smelling discharge.  Keep all follow-up visits as told by your health care provider. This is important.  Drink enough fluid to keep your urine pale yellow. Contact a health care provider if you have:  Vaginal redness, swelling, or  pain.  Vaginal discharge or bleeding that gets worse instead of getting better.  Bad-smelling vaginal discharge.  A fever or chills.  Trouble urinating. Get help right away if you have:  Heavy vaginal bleeding.  Severe cramps. Summary  After endometrial ablation, it is normal to have thin, watery vaginal discharge that is light pink or brown in color. This may last a few weeks and may be heavier right after the procedure.  Vaginal bleeding is also normal after the procedure and should get better with time.  Check your vaginal area every day for signs of infection, such as bad-smelling discharge.  Keep all follow-up visits as told by your health care provider. This is important. This information is not intended to replace advice given to you by your health care provider. Make sure you discuss any questions you have with your health care provider. Document Revised: 05/15/2018 Document Reviewed: 12/04/2016 Elsevier Patient Education  2020 [employer]  

## 2019-10-06 NOTE — Op Note (Signed)
PREOPERATIVE DIAGNOSIS:  Irregular uterine bleeding POSTOPERATIVE DIAGNOSIS: The same PROCEDURE:  NovaSure Endometrial Ablation SURGEON:  Adam Phenix, MD  INDICATIONS: 34 y.o. [id number]  here for NovaSure Endometrial Ablation.  Risks of surgery were discussed with the patient including but not limited to: bleeding which may require transfusion; infection which may require antibiotics; injury to uterus leading to risk of injury to surrounding intraperitoneal organs, need for additional procedures including laparoscopy or laparotomy, and other postoperative/anesthesia complications. Written informed consent was obtained.   FINDINGS:  A 4-6 week size uterus.  ANESTHESIA:   General and paracervical block with 10 ml of 0.5% Marcaine INTRAVENOUS FLUIDS:  500 ml of LR ESTIMATED BLOOD LOSS:  Less than 20 ml SPECIMENS: none  COMPLICATIONS:  None immediate  PROCEDURE DETAILS:  The patient was taken to the operating room where general anesthesia was administered and was found to be adequate.  After an adequate timeout was performed, she was placed in the dorsal lithotomy position and examined; then prepped and draped in the sterile manner.   Her bladder was catheterized for an unmeasured amount of clear, yellow urine. A speculum was then placed in the patient's vagina and a single tooth tenaculum was applied to the anterior lip of the cervix.  A paracervical block using 0.5% Marcaine was administered. The sound was used to obtain the cervical and uterine cavity length measurements at 3.5 cm and 4.5 cm respectively; total sounding length 8 cm.  The cervix was dilated manually with Hegar dilatorsThe NovaSure device was inserted, and a cavity width of 4.7 cm was determined. Using a power of 116 watts, for 120 sec, the endometrial ablation was performed.  The tenaculum was removed from the anterior lip of the cervix, and the vaginal speculum was removed after noting good hemostasis.  The patient tolerated the  procedure well and was taken to the recovery area awake, extubated and in stable condition.  The patient will be discharged to home as per PACU criteria.  Routine postoperative instructions given.  She was prescribed Percocet,and Ibuprofen.  She will follow up in the clinic for postoperative evaluation.   Adam Phenix, MD 10/06/2019

## 2019-10-07 ENCOUNTER — Encounter (HOSPITAL_BASED_OUTPATIENT_CLINIC_OR_DEPARTMENT_OTHER): Payer: Self-pay | Admitting: Obstetrics & Gynecology

## 2019-10-21 ENCOUNTER — Ambulatory Visit (INDEPENDENT_AMBULATORY_CARE_PROVIDER_SITE_OTHER): Payer: Medicaid Other | Admitting: Advanced Practice Midwife

## 2019-10-21 ENCOUNTER — Other Ambulatory Visit: Payer: Self-pay

## 2019-10-21 ENCOUNTER — Encounter: Payer: Self-pay | Admitting: Advanced Practice Midwife

## 2019-10-21 VITALS — BP 136/82 | HR 85 | Ht 68.0 in | Wt 269.2 lb

## 2019-10-21 DIAGNOSIS — Z0000 Encounter for general adult medical examination without abnormal findings: Secondary | ICD-10-CM | POA: Diagnosis not present

## 2019-10-21 DIAGNOSIS — B009 Herpesviral infection, unspecified: Secondary | ICD-10-CM

## 2019-10-21 MED ORDER — VALACYCLOVIR HCL 1 G PO TABS: 1000.0000 mg | ORAL_TABLET | Freq: Every day | ORAL | 11 refills | Status: DC

## 2019-10-21 NOTE — Progress Notes (Signed)
Subjective:     Danielle [name] is a 34 y.o. female here at [facility] for a routine exam.  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  Personal health questionnaire reviewed: yes.  Do you have a primary care provider? yes    Gynecologic History Patient's last menstrual period was 10/21/2019 (exact date). Contraception: tubal ligation Last Pap: 07/03/17. Results were: normal   Obstetric History OB History  Gravida Para Term Preterm AB Living  8 6 6  0 2 6  SAB TAB Ectopic Multiple Live Births  2 0 0 0 6    # Outcome Date GA Lbr Len/2nd Weight Sex Delivery Anes PTL Lv  8 Term 09/11/18 [redacted]w[redacted]d 04:58 / 00:52 7 lb 5.6 oz (3.334 kg) M Vag-Spont EPI  LIV     Birth Comments: wnl  7 Term 10/29/17 [redacted]w[redacted]d 02:11 / 00:04 6 lb 12.3 oz (3.07 kg) M Vag-Spont None  LIV  6 Term 08/21/16 [redacted]w[redacted]d 09:13 / 00:03 7 lb 7.8 oz (3.396 kg) M Vag-Spont None  LIV  5 Term 08/16/14 [redacted]w[redacted]d 04:10 / 00:13 7 lb 10.8 oz (3.48 kg) M Vag-Spont None  LIV  4 Term 08/19/13 [redacted]w[redacted]d  7 lb 12 oz (3.515 kg) M Vag-Spont None  LIV  3 SAB 2013          2 Term 10/05/03 [redacted]w[redacted]d  7 lb 12 oz (3.515 kg) M Vag-Spont None  LIV  1 SAB             Obstetric Comments  Pt states she was induced at [redacted]w[redacted]d for pre-eclampsia for last pregnacy     The following portions of the patient's history were reviewed and updated as appropriate: allergies, current medications, past family history, past medical history, past social history, past surgical history and problem list.  Review of Systems Pertinent items noted in HPI and remainder of comprehensive ROS otherwise negative.    Objective:   VS reviewed, nursing note reviewed,  Constitutional: well developed, well nourished, no distress HEENT: normocephalic CV: normal rate Pulm/chest wall: normal effort Abdomen: soft Neuro: alert and oriented x 3 Skin: warm, dry Psych: affect normal     Assessment/Plan:   1. HSV-1 (herpes simplex  virus 1) infection --Rx for refills for tx of recurrent HSV - valACYclovir (VALTREX) 1000 MG tablet; Take 1 tablet (1,000 mg total) by mouth daily.  Dispense: 5 tablet; Refill: 11  2. Well woman exam (no gynecological exam) --Doing well, light menses since ablation, BTL for contraception.    Follow up in: 1 year or as needed.   [redacted]w[redacted]d, CNM 3:33 PM

## 2019-10-21 NOTE — Progress Notes (Signed)
Pt. Presents for annual exam Last Pap: 07/03/2017 Declines Flu vaccine at this time Declines STI testing a this time Denies any questions or concerns at this time

## 2020-01-19 ENCOUNTER — Other Ambulatory Visit (HOSPITAL_COMMUNITY)
Admission: RE | Admit: 2020-01-19 | Discharge: 2020-01-19 | Disposition: A | Discharge status: Home / Self Care | Payer: Medicaid Other | Source: Ambulatory Visit | Attending: Obstetrics | Admitting: Obstetrics

## 2020-01-19 ENCOUNTER — Ambulatory Visit (INDEPENDENT_AMBULATORY_CARE_PROVIDER_SITE_OTHER): Payer: Medicaid Other

## 2020-01-19 ENCOUNTER — Other Ambulatory Visit: Payer: Self-pay

## 2020-01-19 DIAGNOSIS — N898 Other specified noninflammatory disorders of vagina: Secondary | ICD-10-CM | POA: Diagnosis not present

## 2020-01-19 DIAGNOSIS — Z113 Encounter for screening for infections with a predominantly sexual mode of transmission: Secondary | ICD-10-CM

## 2020-01-19 NOTE — Progress Notes (Signed)
SUBJECTIVE:  34 y.o. female complains of malodorous vaginal discharge for 1 week.  Pt also requests all STD testing today. Denies abnormal vaginal bleeding or significant pelvic pain or fever. No UTI symptoms. Denies history of known exposure to STD.    No LMP recorded. Patient has had an ablation.  OBJECTIVE:  She appears well, afebrile. Urine dipstick: not done.  ASSESSMENT:  Vaginal Discharge  Vaginal Odor   PLAN:  GC, chlamydia, trichomonas, BVAG, CVAG probe sent to lab. Treatment: To be determined after the provider reviews the results. ROV prn if symptoms persist or worsen.

## 2020-01-20 LAB — HIV ANTIBODY (ROUTINE TESTING W REFLEX): HIV Screen 4th Generation wRfx: NONREACTIVE

## 2020-01-20 LAB — CERVICOVAGINAL ANCILLARY ONLY
Bacterial Vaginitis (gardnerella): NEGATIVE
Candida Glabrata: NEGATIVE
Candida Vaginitis: NEGATIVE
Chlamydia: NEGATIVE
Comment: NEGATIVE
Comment: NEGATIVE
Comment: NEGATIVE
Comment: NEGATIVE
Comment: NEGATIVE
Comment: NORMAL
Neisseria Gonorrhea: NEGATIVE
Trichomonas: NEGATIVE

## 2020-01-20 LAB — RPR: RPR Ser Ql: NONREACTIVE

## 2020-01-20 LAB — HEPATITIS C ANTIBODY: Hep C Virus Ab: 0.1 s/co ratio (ref 0.0–0.9)

## 2020-01-20 LAB — HEPATITIS B SURFACE ANTIGEN: Hepatitis B Surface Ag: NEGATIVE

## 2020-01-20 NOTE — Progress Notes (Signed)
Patient was assessed and managed by nursing staff during this encounter. I have reviewed the chart and agree with the documentation and plan. I have also made any necessary editorial changes.  Tarren Sabree, MD 01/20/2020 8:19 AM 

## 2020-05-04 ENCOUNTER — Other Ambulatory Visit: Payer: Self-pay

## 2020-05-04 MED ORDER — FLUCONAZOLE 150 MG PO TABS: 150.0000 mg | ORAL_TABLET | Freq: Once | ORAL | 0 refills | Status: AC

## 2020-05-04 NOTE — Telephone Encounter (Signed)
Refill on yeast infection medications.

## 2020-05-06 ENCOUNTER — Other Ambulatory Visit: Payer: Self-pay

## 2020-05-06 DIAGNOSIS — B373 Candidiasis of vulva and vagina: Secondary | ICD-10-CM

## 2020-05-06 DIAGNOSIS — B3731 Acute candidiasis of vulva and vagina: Secondary | ICD-10-CM

## 2020-05-06 MED ORDER — FLUCONAZOLE 150 MG PO TABS: 150.0000 mg | ORAL_TABLET | Freq: Every day | ORAL | 3 refills | Status: DC

## 2020-05-06 NOTE — Telephone Encounter (Signed)
Refill on diflucan

## 2020-07-14 ENCOUNTER — Ambulatory Visit (INDEPENDENT_AMBULATORY_CARE_PROVIDER_SITE_OTHER): Payer: Medicaid Other

## 2020-07-14 ENCOUNTER — Other Ambulatory Visit: Payer: Self-pay

## 2020-07-14 ENCOUNTER — Other Ambulatory Visit (HOSPITAL_COMMUNITY)
Admission: RE | Admit: 2020-07-14 | Discharge: 2020-07-14 | Disposition: A | Discharge status: Home / Self Care | Payer: Medicaid Other | Source: Ambulatory Visit

## 2020-07-14 DIAGNOSIS — N898 Other specified noninflammatory disorders of vagina: Secondary | ICD-10-CM

## 2020-07-14 NOTE — Progress Notes (Signed)
Pt is in the office for self swab Reports vaginal discharge and odor

## 2020-07-15 LAB — CERVICOVAGINAL ANCILLARY ONLY
Bacterial Vaginitis (gardnerella): POSITIVE — AB
Candida Glabrata: NEGATIVE
Candida Vaginitis: POSITIVE — AB
Chlamydia: NEGATIVE
Comment: NEGATIVE
Comment: NEGATIVE
Comment: NEGATIVE
Comment: NEGATIVE
Comment: NEGATIVE
Comment: NORMAL
Neisseria Gonorrhea: NEGATIVE
Trichomonas: NEGATIVE

## 2020-07-17 ENCOUNTER — Other Ambulatory Visit: Payer: Self-pay | Admitting: Obstetrics

## 2020-07-17 DIAGNOSIS — B9689 Other specified bacterial agents as the cause of diseases classified elsewhere: Secondary | ICD-10-CM

## 2020-07-17 DIAGNOSIS — B3731 Acute candidiasis of vulva and vagina: Secondary | ICD-10-CM

## 2020-07-17 DIAGNOSIS — N760 Acute vaginitis: Secondary | ICD-10-CM

## 2020-07-17 MED ORDER — FLUCONAZOLE 150 MG PO TABS: 150.0000 mg | ORAL_TABLET | Freq: Every day | ORAL | 3 refills | Status: DC

## 2020-07-17 MED ORDER — METRONIDAZOLE 500 MG PO TABS: 500.0000 mg | ORAL_TABLET | Freq: Two times a day (BID) | ORAL | 2 refills | Status: DC

## 2020-07-26 ENCOUNTER — Other Ambulatory Visit: Payer: Self-pay

## 2020-07-26 ENCOUNTER — Emergency Department (HOSPITAL_COMMUNITY)
Admission: EM | Admit: 2020-07-26 | Discharge: 2020-07-26 | Disposition: A | Discharge status: Home / Self Care | Payer: Medicaid Other | Attending: Emergency Medicine | Admitting: Emergency Medicine

## 2020-07-26 ENCOUNTER — Emergency Department (HOSPITAL_COMMUNITY): Payer: Medicaid Other

## 2020-07-26 ENCOUNTER — Encounter (HOSPITAL_COMMUNITY): Payer: Self-pay

## 2020-07-26 DIAGNOSIS — Z9049 Acquired absence of other specified parts of digestive tract: Secondary | ICD-10-CM | POA: Diagnosis not present

## 2020-07-26 DIAGNOSIS — M549 Dorsalgia, unspecified: Secondary | ICD-10-CM

## 2020-07-26 DIAGNOSIS — E039 Hypothyroidism, unspecified: Secondary | ICD-10-CM | POA: Diagnosis not present

## 2020-07-26 DIAGNOSIS — Z79899 Other long term (current) drug therapy: Secondary | ICD-10-CM | POA: Diagnosis not present

## 2020-07-26 DIAGNOSIS — M545 Low back pain, unspecified: Secondary | ICD-10-CM | POA: Insufficient documentation

## 2020-07-26 DIAGNOSIS — R109 Unspecified abdominal pain: Secondary | ICD-10-CM | POA: Diagnosis present

## 2020-07-26 DIAGNOSIS — Z91040 Latex allergy status: Secondary | ICD-10-CM | POA: Insufficient documentation

## 2020-07-26 DIAGNOSIS — I10 Essential (primary) hypertension: Secondary | ICD-10-CM | POA: Insufficient documentation

## 2020-07-26 LAB — CBC WITH DIFFERENTIAL/PLATELET
Abs Immature Granulocytes: 0.02 10*3/uL (ref 0.00–0.07)
Basophils Absolute: 0 10*3/uL (ref 0.0–0.1)
Basophils Relative: 0 %
Eosinophils Absolute: 0.1 10*3/uL (ref 0.0–0.5)
Eosinophils Relative: 1 %
HCT: 38.3 % (ref 36.0–46.0)
Hemoglobin: 11.4 g/dL — ABNORMAL LOW (ref 12.0–15.0)
Immature Granulocytes: 0 %
Lymphocytes Relative: 37 %
Lymphs Abs: 2.4 10*3/uL (ref 0.7–4.0)
MCH: 21 pg — ABNORMAL LOW (ref 26.0–34.0)
MCHC: 29.8 g/dL — ABNORMAL LOW (ref 30.0–36.0)
MCV: 70.7 fL — ABNORMAL LOW (ref 80.0–100.0)
Monocytes Absolute: 0.5 10*3/uL (ref 0.1–1.0)
Monocytes Relative: 8 %
Neutro Abs: 3.4 10*3/uL (ref 1.7–7.7)
Neutrophils Relative %: 54 %
Platelets: 214 10*3/uL (ref 150–400)
RBC: 5.42 MIL/uL — ABNORMAL HIGH (ref 3.87–5.11)
RDW: 19.8 % — ABNORMAL HIGH (ref 11.5–15.5)
WBC: 6.4 10*3/uL (ref 4.0–10.5)
nRBC: 0 % (ref 0.0–0.2)

## 2020-07-26 LAB — URINALYSIS, MICROSCOPIC (REFLEX): Bacteria, UA: NONE SEEN

## 2020-07-26 LAB — COMPREHENSIVE METABOLIC PANEL
ALT: 16 U/L (ref 0–44)
AST: 17 U/L (ref 15–41)
Albumin: 4.1 g/dL (ref 3.5–5.0)
Alkaline Phosphatase: 60 U/L (ref 38–126)
Anion gap: 6 (ref 5–15)
BUN: 8 mg/dL (ref 6–20)
CO2: 27 mmol/L (ref 22–32)
Calcium: 9 mg/dL (ref 8.9–10.3)
Chloride: 107 mmol/L (ref 98–111)
Creatinine, Ser: 0.63 mg/dL (ref 0.44–1.00)
GFR, Estimated: >60 mL/min (ref >60)
Glucose, Bld: 89 mg/dL (ref 70–99)
Potassium: 4.1 mmol/L (ref 3.5–5.1)
Sodium: 140 mmol/L (ref 135–145)
Total Bilirubin: 0.4 mg/dL (ref 0.3–1.2)
Total Protein: 7.9 g/dL (ref 6.5–8.1)

## 2020-07-26 LAB — URINALYSIS, ROUTINE W REFLEX MICROSCOPIC
Bilirubin Urine: NEGATIVE
Glucose, UA: NEGATIVE mg/dL
Ketones, ur: NEGATIVE mg/dL
Leukocytes,Ua: NEGATIVE
Nitrite: NEGATIVE
Protein, ur: NEGATIVE mg/dL
Specific Gravity, Urine: >1.03 — ABNORMAL HIGH (ref 1.005–1.030)
pH: 6 (ref 5.0–8.0)

## 2020-07-26 MED ORDER — NAPROXEN 500 MG PO TABS: 500.0000 mg | ORAL_TABLET | Freq: Two times a day (BID) | ORAL | 0 refills | Status: DC | PRN

## 2020-07-26 MED ORDER — METHOCARBAMOL 500 MG PO TABS: 500.0000 mg | ORAL_TABLET | Freq: Three times a day (TID) | ORAL | 0 refills | Status: DC | PRN

## 2020-07-26 MED ORDER — KETOROLAC TROMETHAMINE 15 MG/ML IJ SOLN
30.0000 mg | Freq: Once | INTRAMUSCULAR | Status: AC
  Administered 2020-07-26: 30 mg via INTRAMUSCULAR
  Filled 2020-07-26: qty 2

## 2020-07-26 MED ORDER — LIDOCAINE 5 % EX PTCH: 1.0000 | MEDICATED_PATCH | Freq: Every day | CUTANEOUS | 0 refills | Status: DC | PRN

## 2020-07-26 NOTE — Discharge Instructions (Signed)
You were seen in the emergency department for back pain today.  Your labs showed some mild anemia which is similar to her prior blood work on record, your CT scan showed some questionable fatty liver changes and some mild degenerative changes in your lower back (sacroiliac joints), please discuss each of these with your primary care provider.  We suspect your pain is related to a musculoskeletal problem.  We have prescribed you an anti-inflammatory medication and a muscle relaxer.  - Naproxen is a nonsteroidal anti-inflammatory medication that will help with pain and swelling. Be sure to take this medication as prescribed with food, 1 pill every 12 hours,  It should be taken with food, as it can cause stomach upset, and more seriously, stomach bleeding. Do not take other nonsteroidal anti-inflammatory medications with this such as Advil, Motrin, Aleve, Mobic, Goodie Powder, or Motrin.    - Robaxin is the muscle relaxer I have prescribed, this is meant to help with muscle tightness. Be aware that this medication may make you drowsy therefore the first time you take this it should be at a time you are in an environment where you can rest. Do not drive or operate heavy machinery when taking this medication. Do not drink alcohol or take other sedating medications with this medicine such as narcotics or benzodiazepines.   -Lidoderm patch: Please apply 1 patch to area for significant pain once per day.  Remove and discard patch within 12 hours of application.  You make take Tylenol per over the counter dosing with these medications.   We have prescribed you new medication(s) today. Discuss the medications prescribed today with your pharmacist as they can have adverse effects and interactions with your other medicines including over the counter and prescribed medications. Seek medical evaluation if you start to experience new or abnormal symptoms after taking one of these medicines, seek care immediately if  you start to experience difficulty breathing, feeling of your throat closing, facial swelling, or rash as these could be indications of a more serious allergic reaction   The application of heat can help soothe the pain.    Please follow-up with primary care within 1 week.  However return to the ER should you develop new or worsening symptoms or any other concerns including but not limited to severe or worsening pain, low back pain with fever, numbness, weakness, loss of bowel or bladder control, inability to walk or urinate, or any other concerns.
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
## 2020-07-26 NOTE — ED Provider Notes (Signed)
15:30: Assumed care of patient from Shawn Joy PA-C at change of shift pending urinalysis & disposition.   Please see prior provider not for full H&P.  Briefly patient is a 35 yo female who presents with back pain.   CBC: Mild anemia similar to prior.  CMP: unremarkable.  CT A/P: 1. No acute findings or clear explanation for the patient's symptoms. 2. Colonic diverticulosis without evidence of acute inflammation. The appendix appears normal. 3. Borderline hepatic steatosis post cholecystectomy. 4. Bilateral sacroiliac degenerative changes  Plan @ change of shift is to follow up on UA- if concerning for infection- tx as pyelonephritis, if no significant infection tx as MSK pain.   UA: elevated specific gravity, small hgb present, no obvious infection.   Reassuring ED work-up.  Patient without neurologic deficits on exam, ambulatory- do not suspect acute cord compression or cauda equina syndrome. Afebrile without hx of IVDU to suggest epidural abscess. CT & UA without findings of kidney stone or pyelonephritis.   Will tx as MSK pain. PCP follow up.   I discussed results, treatment plan, need for follow-up, and return precautions with the patient. Provided opportunity for questions, patient confirmed understanding and is in agreement with plan.    Results for orders placed or performed during the hospital encounter of 07/26/20  Comprehensive metabolic panel  Result Value Ref Range   Sodium 140 135 - 145 mmol/L   Potassium 4.1 3.5 - 5.1 mmol/L   Chloride 107 98 - 111 mmol/L   CO2 27 22 - 32 mmol/L   Glucose, Bld 89 70 - 99 mg/dL   BUN 8 6 - 20 mg/dL   Creatinine, Ser 6.16 0.44 - 1.00 mg/dL   Calcium 9.0 8.9 - 07.3 mg/dL   Total Protein 7.9 6.5 - 8.1 g/dL   Albumin 4.1 3.5 - 5.0 g/dL   AST 17 15 - 41 U/L   ALT 16 0 - 44 U/L   Alkaline Phosphatase 60 38 - 126 U/L   Total Bilirubin 0.4 0.3 - 1.2 mg/dL   GFR, Estimated >71 >06 mL/min   Anion gap 6 5 - 15  CBC with Differential   Result Value Ref Range   WBC 6.4 4.0 - 10.5 K/uL   RBC 5.42 (H) 3.87 - 5.11 MIL/uL   Hemoglobin 11.4 (L) 12.0 - 15.0 g/dL   HCT 26.9 48.5 - 46.2 %   MCV 70.7 (L) 80.0 - 100.0 fL   MCH 21.0 (L) 26.0 - 34.0 pg   MCHC 29.8 (L) 30.0 - 36.0 g/dL   RDW 70.3 (H) 50.0 - 93.8 %   Platelets 214 150 - 400 K/uL   nRBC 0.0 0.0 - 0.2 %   Neutrophils Relative % 54 %   Neutro Abs 3.4 1.7 - 7.7 K/uL   Lymphocytes Relative 37 %   Lymphs Abs 2.4 0.7 - 4.0 K/uL   Monocytes Relative 8 %   Monocytes Absolute 0.5 0.1 - 1.0 K/uL   Eosinophils Relative 1 %   Eosinophils Absolute 0.1 0.0 - 0.5 K/uL   Basophils Relative 0 %   Basophils Absolute 0.0 0.0 - 0.1 K/uL   Immature Granulocytes 0 %   Abs Immature Granulocytes 0.02 0.00 - 0.07 K/uL   CT ABDOMEN PELVIS WO CONTRAST  Result Date: 07/26/2020 CLINICAL DATA:  Progressive left upper and left lower quadrant abdominal pain for several days. Loose stools. No fever. EXAM: CT ABDOMEN AND PELVIS WITHOUT CONTRAST TECHNIQUE: Multidetector CT imaging of the abdomen and pelvis was performed  following the standard protocol without IV contrast. COMPARISON:  Pelvic ultrasound 12/23/2018. Abdominopelvic CT 10/24/2012 (report only). FINDINGS: Lower chest: Clear lung bases. No significant pleural or pericardial effusion. Hepatobiliary: Mildly decreased hepatic density borderline for a patent steatosis. No focal abnormality identified on noncontrast imaging. No evidence of biliary dilatation post cholecystectomy. Pancreas: Unremarkable. No pancreatic ductal dilatation or surrounding inflammatory changes. Spleen: 10 mm low-density lesion anteriorly in the spleen on image 18/2, likely a cyst. Otherwise unremarkable. Adrenals/Urinary Tract: Both adrenal glands appear normal. Both kidneys appear unremarkable as imaged in the noncontrast state. No evidence of urinary tract calculus, hydronephrosis or perinephric soft tissue stranding. The bladder appears unremarkable. Stomach/Bowel: No  enteric contrast administered. The stomach appears unremarkable for its degree of distension. No evidence of bowel wall thickening, distention or surrounding inflammatory change. The appendix appears normal. There are scattered diverticula throughout the colon, especially within the descending colon. No evidence of acute colonic inflammation. Vascular/Lymphatic: There are no enlarged abdominal or pelvic lymph nodes. No significant vascular findings on noncontrast imaging. Reproductive: Vaginal tampon in place. The uterus and ovaries otherwise appear unremarkable. Other: Small umbilical hernia containing only fat. No ascites or free air. Musculoskeletal: No acute or significant osseous findings. Moderate sacroiliac degenerative changes bilaterally. IMPRESSION: 1. No acute findings or clear explanation for the patient's symptoms. 2. Colonic diverticulosis without evidence of acute inflammation. The appendix appears normal. 3. Borderline hepatic steatosis post cholecystectomy. 4. Bilateral sacroiliac degenerative changes. Electronically Signed   By: Carey Bullocks M.D.   On: 07/26/2020 13:05         Cherly Anderson, PA-C 07/26/20 1729    Rolan Bucco, MD 07/26/20 [phone]

## 2020-07-26 NOTE — ED Triage Notes (Signed)
Patient c/o left flank pain x 2 days. Patient denies any dysuria, N/v/d.

## 2020-08-11 ENCOUNTER — Ambulatory Visit (INDEPENDENT_AMBULATORY_CARE_PROVIDER_SITE_OTHER): Payer: Medicaid Other | Admitting: Advanced Practice Midwife

## 2020-08-11 ENCOUNTER — Other Ambulatory Visit (HOSPITAL_COMMUNITY)
Admission: RE | Admit: 2020-08-11 | Discharge: 2020-08-11 | Disposition: A | Discharge status: Home / Self Care | Payer: Medicaid Other | Source: Ambulatory Visit | Attending: Advanced Practice Midwife | Admitting: Advanced Practice Midwife

## 2020-08-11 ENCOUNTER — Other Ambulatory Visit: Payer: Self-pay

## 2020-08-11 VITALS — BP 142/102 | HR 98 | Ht 67.0 in | Wt 276.0 lb

## 2020-08-11 DIAGNOSIS — R109 Unspecified abdominal pain: Secondary | ICD-10-CM

## 2020-08-11 DIAGNOSIS — Z01419 Encounter for gynecological examination (general) (routine) without abnormal findings: Secondary | ICD-10-CM | POA: Diagnosis not present

## 2020-08-11 DIAGNOSIS — Z113 Encounter for screening for infections with a predominantly sexual mode of transmission: Secondary | ICD-10-CM | POA: Insufficient documentation

## 2020-08-11 DIAGNOSIS — N920 Excessive and frequent menstruation with regular cycle: Secondary | ICD-10-CM | POA: Diagnosis not present

## 2020-08-11 DIAGNOSIS — Z01411 Encounter for gynecological examination (general) (routine) with abnormal findings: Secondary | ICD-10-CM | POA: Diagnosis not present

## 2020-08-11 DIAGNOSIS — N898 Other specified noninflammatory disorders of vagina: Secondary | ICD-10-CM

## 2020-08-11 DIAGNOSIS — Z124 Encounter for screening for malignant neoplasm of cervix: Secondary | ICD-10-CM

## 2020-08-11 NOTE — Progress Notes (Signed)
Subjective:     Danielle [name] is a 35 y.o. female here at [facility] for a routine exam.  Current complaints: none.  Personal health questionnaire reviewed: yes.  Do you have a primary care provider? yes Do you feel safe at home? yes  Flowsheet Row Initial Prenatal from 08/17/2016 in [facility]  PHQ-2 Total Score 0       Health Maintenance Due  Topic Date Due   COVID-19 Vaccine (1) Never done   PAP SMEAR-Modifier  07/03/2020     Risk factors for chronic health problems: Smoking: Never smoker Alchohol/how much: None Pt BMI: Body mass index is 43.23 kg/m.   Gynecologic History No LMP recorded. Patient has had an ablation. Contraception: tubal ligation Last Pap: 2019. Results were: normal Last mammogram: n/a.   Obstetric History OB History  Gravida Para Term Preterm AB Living  8 6 6  0 2 6  SAB IAB Ectopic Multiple Live Births  2 0 0 0 6    # Outcome Date GA Lbr Len/2nd Weight Sex Delivery Anes PTL Lv  8 Term 09/11/18 [redacted]w[redacted]d 04:58 / 00:52 7 lb 5.6 oz (3.334 kg) M Vag-Spont EPI  LIV     Birth Comments: wnl  7 Term 10/29/17 [redacted]w[redacted]d 02:11 / 00:04 6 lb 12.3 oz (3.07 kg) M Vag-Spont None  LIV  6 Term 08/21/16 [redacted]w[redacted]d 09:13 / 00:03 7 lb 7.8 oz (3.396 kg) M Vag-Spont None  LIV  5 Term 08/16/14 [redacted]w[redacted]d 04:10 / 00:13 7 lb 10.8 oz (3.48 kg) M Vag-Spont None  LIV  4 Term 08/19/13 [redacted]w[redacted]d  7 lb 12 oz (3.515 kg) M Vag-Spont None  LIV  3 SAB 2013          2 Term 10/05/03 [redacted]w[redacted]d  7 lb 12 oz (3.515 kg) M Vag-Spont None  LIV  1 SAB             Obstetric Comments  Pt states she was induced at [redacted]w[redacted]d for pre-eclampsia for last pregnacy     The following portions of the patient's history were reviewed and updated as appropriate: allergies, current medications, past family history, past medical history, past social history, past surgical history, and problem list.  Review of Systems Pertinent items noted in HPI and remainder of comprehensive ROS otherwise negative.     Objective:   BP (!) 142/102   Pulse 98   Ht 5\' 7"  (1.702 m)   Wt 276 lb (125.2 kg)   Breastfeeding No   BMI 43.23 kg/m  VS reviewed, nursing note reviewed,  Constitutional: well developed, well nourished, no distress HEENT: normocephalic CV: normal rate Pulm/chest wall: normal effort Breast Exam:  exam performed: right breast normal without mass, skin or nipple changes or axillary nodes, left breast normal without mass, skin or nipple changes or axillary nodes Abdomen: soft Neuro: alert and oriented x 3 Skin: warm, dry Psych: affect normal Pelvic exam: Performed: Cervix pink, visually closed, without lesion, scant white creamy discharge, vaginal walls and external genitalia normal Bimanual exam: Cervix 0/long/high, firm, anterior, neg CMT, uterus nontender, nonenlarged, adnexa without tenderness, enlargement, or mass       Assessment/Plan:   1. Vaginal discharge --Pt reported vaginal discharge with odor on 07/26/20 at ED visit, some discharge without itching or odor today. - Cervicovaginal ancillary only  2. Screen for STD (sexually transmitted disease)  - Hepatitis C antibody - Hepatitis B surface antigen - HIV Antibody (routine testing w rflx) - RPR - Cervicovaginal ancillary only  3. Cervical cancer screening --Pt had normal Pap in 2019. Discussed ASCCP guidelines of Pap every 5 years. Pt to repeat Pap today, if normal, consider 5 years for next Pap.  - Cytology - PAP  4. Left flank pain --Pt with ED visit on 07/26/20 for left flank pain, work up negative, tx for MSK pain. Still reports pain with movement. --No evidence of pelvic pain on exam, pain not likely gyn in nature. --Pt to f/u with PCP if pain persists.   5. Well woman exam with routine gynecological exam --Doing well overall, minimal bleeding with periods since ablation in 2021, BTL for contraception, uses condoms for STD prevention.     Follow up in: 1  year  or as needed.   Sharen Counter,  CNM 2:50 PM

## 2020-08-11 NOTE — Progress Notes (Signed)
Pt requesting bv/yeast swab. Denies symptoms but states she would like to make sure it is gone.

## 2020-08-12 LAB — CERVICOVAGINAL ANCILLARY ONLY
Bacterial Vaginitis (gardnerella): NEGATIVE
Candida Glabrata: NEGATIVE
Candida Vaginitis: NEGATIVE
Chlamydia: NEGATIVE
Comment: NEGATIVE
Comment: NEGATIVE
Comment: NEGATIVE
Comment: NEGATIVE
Comment: NEGATIVE
Comment: NORMAL
Neisseria Gonorrhea: NEGATIVE
Trichomonas: NEGATIVE

## 2020-08-12 LAB — RPR: RPR Ser Ql: NONREACTIVE

## 2020-08-12 LAB — HEPATITIS B SURFACE ANTIGEN: Hepatitis B Surface Ag: NEGATIVE

## 2020-08-12 LAB — HIV ANTIBODY (ROUTINE TESTING W REFLEX): HIV Screen 4th Generation wRfx: NONREACTIVE

## 2020-08-12 LAB — HEPATITIS C ANTIBODY: Hep C Virus Ab: <0.1 s/co ratio (ref 0.0–0.9)

## 2020-08-18 LAB — CYTOLOGY - PAP
Comment: NEGATIVE
Diagnosis: NEGATIVE
High risk HPV: NEGATIVE
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
## 2020-12-01 ENCOUNTER — Other Ambulatory Visit: Payer: Self-pay | Admitting: Advanced Practice Midwife

## 2020-12-01 MED ORDER — IBUPROFEN 600 MG PO TABS: 600.0000 mg | ORAL_TABLET | Freq: Four times a day (QID) | ORAL | 1 refills | Status: DC | PRN

## 2020-12-01 NOTE — Progress Notes (Signed)
Ibuprofen Rx sent at pt request for dysmenorrhea

## 2021-03-16 ENCOUNTER — Ambulatory Visit: Payer: Medicaid Other

## 2021-03-20 ENCOUNTER — Ambulatory Visit (INDEPENDENT_AMBULATORY_CARE_PROVIDER_SITE_OTHER): Payer: Medicaid Other

## 2021-03-20 ENCOUNTER — Other Ambulatory Visit: Payer: Self-pay

## 2021-03-20 ENCOUNTER — Other Ambulatory Visit (HOSPITAL_COMMUNITY)
Admission: RE | Admit: 2021-03-20 | Discharge: 2021-03-20 | Disposition: A | Discharge status: Home / Self Care | Payer: Medicaid Other | Source: Ambulatory Visit | Attending: Obstetrics | Admitting: Obstetrics

## 2021-03-20 VITALS — BP 143/82 | HR 91 | Ht 67.0 in | Wt 285.0 lb

## 2021-03-20 DIAGNOSIS — Z113 Encounter for screening for infections with a predominantly sexual mode of transmission: Secondary | ICD-10-CM | POA: Diagnosis not present

## 2021-03-20 DIAGNOSIS — N898 Other specified noninflammatory disorders of vagina: Secondary | ICD-10-CM | POA: Insufficient documentation

## 2021-03-20 NOTE — Progress Notes (Signed)
Patient was assessed and managed by nursing staff during this encounter. I have reviewed the chart and agree with the documentation and plan. I have also made any necessary editorial changes. ° °Raymund Manrique, MD °03/20/2021 3:13 PM  ° °

## 2021-03-20 NOTE — Progress Notes (Signed)
SUBJECTIVE:  36 y.o. female complains of white and malodorous vaginal discharge for 4 days day(s). Denies abnormal vaginal bleeding or significant pelvic pain or fever. No UTI symptoms. Denies history of known exposure to STD.  No LMP recorded. Patient has had an ablation.  OBJECTIVE:  She appears well, afebrile. Urine dipstick: not done.  ASSESSMENT:  Vaginal Discharge  Vaginal Odor   PLAN:  GC, chlamydia, trichomonas, BVAG, CVAG probe sent to lab. Treatment: To be determined once lab results are received ROV prn if symptoms persist or worsen.

## 2021-03-21 LAB — CERVICOVAGINAL ANCILLARY ONLY
Bacterial Vaginitis (gardnerella): NEGATIVE
Candida Glabrata: NEGATIVE
Candida Vaginitis: NEGATIVE
Chlamydia: NEGATIVE
Comment: NEGATIVE
Comment: NEGATIVE
Comment: NEGATIVE
Comment: NEGATIVE
Comment: NEGATIVE
Comment: NORMAL
Neisseria Gonorrhea: NEGATIVE
Trichomonas: NEGATIVE

## 2021-06-12 ENCOUNTER — Other Ambulatory Visit (HOSPITAL_COMMUNITY)
Admission: RE | Admit: 2021-06-12 | Discharge: 2021-06-12 | Disposition: A | Discharge status: Home / Self Care | Payer: Medicaid Other | Source: Ambulatory Visit | Attending: Obstetrics and Gynecology | Admitting: Obstetrics and Gynecology

## 2021-06-12 ENCOUNTER — Ambulatory Visit (INDEPENDENT_AMBULATORY_CARE_PROVIDER_SITE_OTHER): Payer: Medicaid Other

## 2021-06-12 VITALS — BP 138/83 | HR 90 | Ht 67.0 in | Wt 288.0 lb

## 2021-06-12 DIAGNOSIS — N898 Other specified noninflammatory disorders of vagina: Secondary | ICD-10-CM

## 2021-06-12 NOTE — Progress Notes (Signed)
Agree with nurses's documentation of this patient's clinic encounter.  Kolson Chovanec L, MD  

## 2021-06-12 NOTE — Progress Notes (Signed)
SUBJECTIVE:  ?36 y.o. female complains of dark and malodorous vaginal discharge for 7 day(s). ?Denies abnormal vaginal bleeding or significant pelvic pain or ?fever. No UTI symptoms. Denies history of known exposure to STD. ? ?Patient's last menstrual period was 05/26/2021 (exact date). ? ?OBJECTIVE:  ?She appears well, afebrile. ?Urine dipstick: not done. ? ?ASSESSMENT:  ?Vaginal Discharge  ?Vaginal Odor ? ? ?PLAN:  ?GC, chlamydia, trichomonas, BVAG, CVAG probe sent to lab. ?Treatment: To be determined once lab results are received ?ROV prn if symptoms persist or worsen.  ?

## 2021-06-13 ENCOUNTER — Encounter: Payer: Self-pay | Admitting: Advanced Practice Midwife

## 2021-06-13 LAB — CERVICOVAGINAL ANCILLARY ONLY
Bacterial Vaginitis (gardnerella): POSITIVE — AB
Candida Glabrata: NEGATIVE
Candida Vaginitis: NEGATIVE
Chlamydia: NEGATIVE
Comment: NEGATIVE
Comment: NEGATIVE
Comment: NEGATIVE
Comment: NEGATIVE
Comment: NEGATIVE
Comment: NORMAL
Neisseria Gonorrhea: NEGATIVE
Trichomonas: NEGATIVE

## 2021-06-14 ENCOUNTER — Other Ambulatory Visit: Payer: Self-pay

## 2021-06-14 MED ORDER — METRONIDAZOLE 500 MG PO TABS: 500.0000 mg | ORAL_TABLET | Freq: Two times a day (BID) | ORAL | 0 refills | Status: DC

## 2021-06-14 NOTE — Progress Notes (Signed)
Rx sent per provider order 

## 2021-08-10 ENCOUNTER — Telehealth (HOSPITAL_COMMUNITY): Payer: Self-pay | Admitting: Licensed Clinical Social Worker

## 2021-08-10 NOTE — Telephone Encounter (Signed)
Cln returned pt's vm requesting intake appt. Cln explained that all new pts are required to come in during walk-in hours and that we recommend arriving at 7:15 am. Pt confirmed she has MCD and cln provided contact info for [facility], [facility], and [facility] to ensure pt has additional options for tx. Pt verbalized understanding and expressed appreciation.

## 2021-10-26 ENCOUNTER — Ambulatory Visit (INDEPENDENT_AMBULATORY_CARE_PROVIDER_SITE_OTHER): Payer: Medicaid Other | Admitting: General Practice

## 2021-10-26 ENCOUNTER — Other Ambulatory Visit (HOSPITAL_COMMUNITY)
Admission: RE | Admit: 2021-10-26 | Discharge: 2021-10-26 | Disposition: A | Discharge status: Home / Self Care | Payer: Medicaid Other | Source: Ambulatory Visit | Attending: Obstetrics and Gynecology | Admitting: Obstetrics and Gynecology

## 2021-10-26 VITALS — BP 143/86 | HR 88 | Ht 67.0 in | Wt 238.7 lb

## 2021-10-26 DIAGNOSIS — Z113 Encounter for screening for infections with a predominantly sexual mode of transmission: Secondary | ICD-10-CM | POA: Diagnosis present

## 2021-10-26 NOTE — Progress Notes (Signed)
SUBJECTIVE:  36 y.o. female complains of white vaginal discharge for 3 day(s). Denies abnormal vaginal bleeding or significant pelvic pain or fever. No UTI symptoms. Denies history of known exposure to STD.  LMP: 10-04-21  OBJECTIVE:  She appears well, afebrile. Urine dipstick: not done.  ASSESSMENT:  Vaginal Discharge  Vaginal Odor   PLAN:  GC, chlamydia, trichomonas, BVAG, CVAG probe sent to lab. Treatment: To be determined once lab results are received ROV prn if symptoms persist or worsen.

## 2021-10-27 LAB — RPR: RPR Ser Ql: NONREACTIVE

## 2021-10-27 LAB — HIV ANTIBODY (ROUTINE TESTING W REFLEX): HIV Screen 4th Generation wRfx: NONREACTIVE

## 2021-10-27 LAB — HEPATITIS B SURFACE ANTIGEN: Hepatitis B Surface Ag: NEGATIVE

## 2021-10-27 LAB — HEPATITIS C ANTIBODY: Hep C Virus Ab: NONREACTIVE

## 2021-10-29 LAB — CERVICOVAGINAL ANCILLARY ONLY
Bacterial Vaginitis (gardnerella): NEGATIVE
Candida Glabrata: NEGATIVE
Candida Vaginitis: NEGATIVE
Chlamydia: NEGATIVE
Comment: NEGATIVE
Comment: NEGATIVE
Comment: NEGATIVE
Comment: NEGATIVE
Comment: NEGATIVE
Comment: NORMAL
Neisseria Gonorrhea: NEGATIVE
Trichomonas: NEGATIVE

## 2021-11-13 ENCOUNTER — Encounter: Payer: Self-pay | Admitting: Obstetrics and Gynecology

## 2021-11-13 ENCOUNTER — Ambulatory Visit (INDEPENDENT_AMBULATORY_CARE_PROVIDER_SITE_OTHER): Payer: Medicaid Other | Admitting: Obstetrics and Gynecology

## 2021-11-13 VITALS — BP 133/66 | HR 102 | Ht 68.0 in | Wt 233.0 lb

## 2021-11-13 DIAGNOSIS — N939 Abnormal uterine and vaginal bleeding, unspecified: Secondary | ICD-10-CM

## 2021-11-13 DIAGNOSIS — N946 Dysmenorrhea, unspecified: Secondary | ICD-10-CM | POA: Diagnosis not present

## 2021-11-13 MED ORDER — SLYND 4 MG PO TABS: 4.0000 mg | ORAL_TABLET | Freq: Every day | ORAL | 11 refills | Status: DC

## 2021-11-13 NOTE — Progress Notes (Signed)
   Established Patient Office Visit  Subjective   Patient ID: Danielle [name], female    DOB: [birth year]  Age: 36 y.o. MRN: [id number]  Chief Complaint  Patient presents with   AUB    HPI Has a history of continued menstrual bleeding despite endometrial ablation in 2021 (s/p TL 2020) and resumption of cramping as well. Would like explore additional management options. Has occasional dyspareunia but  not consistent. Reports menses are regular but not as heavy as they were pre-procedure. Cramping initially improved after ablation but has returned. She has already undergone TL for contraception.   Objective:    BP 133/66   Pulse (!) 102   Ht 5\' 8"  (1.727 m)   Wt 233 lb (105.7 kg)   LMP 10/29/2021 (Exact Date)   BMI 35.43 kg/m    Physical Exam Vitals and nursing note reviewed.  Constitutional:      Appearance: Normal appearance.  HENT:     Head: Normocephalic and atraumatic.  Pulmonary:     Effort: Pulmonary effort is normal.  Skin:    General: Skin is warm and dry.  Neurological:     General: No focal deficit present.     Mental Status: She is alert.  Psychiatric:        Mood and Affect: Mood normal.        Behavior: Behavior normal.        Thought Content: Thought content normal.        Judgment: Judgment normal.    The ASCVD Risk score (Arnett DK, et al., 2019) failed to calculate for the following reasons:   The 2019 ASCVD risk score is only valid for ages 59 to 65   Assessment & Plan:   1. Abnormal uterine bleeding (AUB) Migraines without aura, elevated BMI, no additional contraindications to [facility]. Recommend repeat US to assess for structural sources of bleeding. Reviewed management options for bleeding. Discussed post-ablation expectations and that bleeding can be addressed medically or surgically. Patient would prefer to avoid CHCs due to history of migraines and would prefer to trial POP initially. If control not achieved with POP will consider [place] and last resort  of hysterectomy (would like to avoid surgery at this time) Reviewed lng-IUD less likely to be successful as endometrial cavity likely scarred due to prior ablation.  - Drospirenone (SLYND) 4 MG TABS; Take 4 mg by mouth daily.  Dispense: 28 tablet; Refill: 11 - US PELVIC COMPLETE WITH TRANSVAGINAL; Future  2. Dysmenorrhea Discussed that pain is primarily related to menses and that lack of no-cyclic pelvic pain reassuring that suppression of menses should address pain. If menstrual suppression not sufficient, can add additional analgesia that is not contraindicated by hx of gastric sleeve.   Darliss Cheney, MD 11/13/21

## 2021-11-13 NOTE — Progress Notes (Signed)
36 y.o. GYN presents for [facility] consult for treatment of AUB, c/o heavy , painful periods 6-7/10, lasting 7-9 days x10 months.

## 2021-11-27 ENCOUNTER — Ambulatory Visit
Admission: RE | Admit: 2021-11-27 | Discharge: 2021-11-27 | Disposition: A | Discharge status: Home / Self Care | Payer: Medicaid Other | Source: Ambulatory Visit | Attending: Obstetrics and Gynecology | Admitting: Obstetrics and Gynecology

## 2021-11-27 DIAGNOSIS — N939 Abnormal uterine and vaginal bleeding, unspecified: Secondary | ICD-10-CM

## 2021-12-03 ENCOUNTER — Encounter: Payer: Self-pay | Admitting: Obstetrics and Gynecology

## 2021-12-11 ENCOUNTER — Ambulatory Visit (INDEPENDENT_AMBULATORY_CARE_PROVIDER_SITE_OTHER): Payer: Medicaid Other | Admitting: *Deleted

## 2021-12-11 ENCOUNTER — Other Ambulatory Visit (HOSPITAL_COMMUNITY)
Admission: RE | Admit: 2021-12-11 | Discharge: 2021-12-11 | Disposition: A | Discharge status: Home / Self Care | Payer: Medicaid Other | Source: Ambulatory Visit | Attending: Obstetrics and Gynecology | Admitting: Obstetrics and Gynecology

## 2021-12-11 VITALS — BP 142/82 | HR 87

## 2021-12-11 DIAGNOSIS — N898 Other specified noninflammatory disorders of vagina: Secondary | ICD-10-CM

## 2021-12-11 DIAGNOSIS — Z113 Encounter for screening for infections with a predominantly sexual mode of transmission: Secondary | ICD-10-CM | POA: Insufficient documentation

## 2021-12-11 NOTE — Progress Notes (Signed)
SUBJECTIVE:  36 y.o. female complains of yellow vaginal discharge for 6 day(s). Denies abnormal vaginal bleeding or significant pelvic pain or fever. No UTI symptoms. Denies history of known exposure to STD. Request STI blood work as well.  Patient's last menstrual period was 10/29/2021 (exact date). Had ablation.  OBJECTIVE:  She appears well, afebrile. Urine dipstick: not done.  ASSESSMENT:  Vaginal Discharge  Vaginal Odor   PLAN:  GC, chlamydia, trichomonas, BVAG, CVAG probe sent to lab. STI blood work collected in lab. Treatment: To be determined once lab results are received ROV prn if symptoms persist or worsen.

## 2021-12-12 LAB — CERVICOVAGINAL ANCILLARY ONLY
Bacterial Vaginitis (gardnerella): NEGATIVE
Candida Glabrata: NEGATIVE
Candida Vaginitis: NEGATIVE
Chlamydia: NEGATIVE
Comment: NEGATIVE
Comment: NEGATIVE
Comment: NEGATIVE
Comment: NEGATIVE
Comment: NEGATIVE
Comment: NORMAL
Neisseria Gonorrhea: NEGATIVE
Trichomonas: NEGATIVE

## 2021-12-12 LAB — RPR: RPR Ser Ql: NONREACTIVE

## 2021-12-12 LAB — HIV ANTIBODY (ROUTINE TESTING W REFLEX): HIV Screen 4th Generation wRfx: NONREACTIVE

## 2021-12-12 LAB — HEPATITIS B SURFACE ANTIGEN: Hepatitis B Surface Ag: NEGATIVE

## 2021-12-12 LAB — HEPATITIS C ANTIBODY: Hep C Virus Ab: NONREACTIVE

## 2022-02-06 ENCOUNTER — Encounter: Payer: Self-pay | Admitting: Obstetrics and Gynecology

## 2022-02-06 DIAGNOSIS — B009 Herpesviral infection, unspecified: Secondary | ICD-10-CM

## 2022-02-06 MED ORDER — VALACYCLOVIR HCL 500 MG PO TABS: 500.0000 mg | ORAL_TABLET | Freq: Two times a day (BID) | ORAL | 11 refills | Status: DC

## 2022-03-12 ENCOUNTER — Ambulatory Visit: Payer: Medicaid Other | Admitting: Obstetrics and Gynecology

## 2022-03-20 ENCOUNTER — Ambulatory Visit: Payer: Medicaid Other | Admitting: Advanced Practice Midwife

## 2022-03-28 ENCOUNTER — Other Ambulatory Visit (HOSPITAL_COMMUNITY)
Admission: RE | Admit: 2022-03-28 | Discharge: 2022-03-28 | Disposition: A | Discharge status: Home / Self Care | Payer: Medicaid Other | Source: Ambulatory Visit | Attending: Obstetrics and Gynecology | Admitting: Obstetrics and Gynecology

## 2022-03-28 ENCOUNTER — Ambulatory Visit (INDEPENDENT_AMBULATORY_CARE_PROVIDER_SITE_OTHER): Payer: Medicaid Other

## 2022-03-28 DIAGNOSIS — N898 Other specified noninflammatory disorders of vagina: Secondary | ICD-10-CM

## 2022-03-28 MED ORDER — METRONIDAZOLE 500 MG PO TABS: 500.0000 mg | ORAL_TABLET | Freq: Two times a day (BID) | ORAL | 0 refills | Status: DC

## 2022-03-28 NOTE — Progress Notes (Signed)
SUBJECTIVE:  37 y.o. female complains of malodorous vaginal discharge for 3 day(s). Denies abnormal vaginal bleeding or significant pelvic pain or fever. No UTI symptoms. Denies history of known exposure to STD.  No LMP recorded. Patient has had an ablation.  OBJECTIVE:  She appears well, afebrile. Urine dipstick: not done.  ASSESSMENT:  Vaginal Discharge  Vaginal Odor   PLAN:  GC, chlamydia, trichomonas, BVAG, CVAG probe sent to lab. Treatment: To be determined once lab results are received ROV prn if symptoms persist or worsen.   Metronidazole sent to pharmacy per protocol

## 2022-03-29 ENCOUNTER — Encounter: Payer: Self-pay | Admitting: Obstetrics and Gynecology

## 2022-03-29 LAB — CERVICOVAGINAL ANCILLARY ONLY
Chlamydia: NEGATIVE
Comment: NEGATIVE
Comment: NEGATIVE
Comment: NORMAL
Neisseria Gonorrhea: NEGATIVE
Trichomonas: NEGATIVE

## 2022-05-11 ENCOUNTER — Other Ambulatory Visit (HOSPITAL_COMMUNITY)
Admission: RE | Admit: 2022-05-11 | Discharge: 2022-05-11 | Disposition: A | Discharge status: Home / Self Care | Payer: Medicaid Other | Source: Ambulatory Visit | Attending: Obstetrics and Gynecology | Admitting: Obstetrics and Gynecology

## 2022-05-11 ENCOUNTER — Ambulatory Visit (INDEPENDENT_AMBULATORY_CARE_PROVIDER_SITE_OTHER): Payer: Medicaid Other

## 2022-05-11 DIAGNOSIS — N898 Other specified noninflammatory disorders of vagina: Secondary | ICD-10-CM | POA: Insufficient documentation

## 2022-05-11 NOTE — Progress Notes (Signed)
..  SUBJECTIVE:  37 y.o. female complains of thick vaginal discharge, itching, and burning for 4 day(s). Denies abnormal vaginal bleeding or significant pelvic pain or fever. No UTI symptoms. Denies history of known exposure to STD. Reports new partner and recent intercourse  No LMP recorded. Patient has had an ablation.  OBJECTIVE:  She appears well, afebrile. Urine dipstick: not done.  ASSESSMENT:  Vaginal Discharge  Vaginal Burning Vaginal Itching   PLAN:  GC, chlamydia, trichomonas, BVAG, CVAG probe sent to lab. Treatment: To be determined once lab results are received ROV prn if symptoms persist or worsen.

## 2022-05-12 LAB — HIV ANTIBODY (ROUTINE TESTING W REFLEX): HIV Screen 4th Generation wRfx: NONREACTIVE

## 2022-05-12 LAB — RPR: RPR Ser Ql: NONREACTIVE

## 2022-05-12 LAB — HEPATITIS C ANTIBODY: Hep C Virus Ab: NONREACTIVE

## 2022-05-12 LAB — HEPATITIS B SURFACE ANTIGEN: Hepatitis B Surface Ag: NEGATIVE

## 2022-05-14 ENCOUNTER — Encounter: Payer: Self-pay | Admitting: Advanced Practice Midwife

## 2022-05-14 LAB — CERVICOVAGINAL ANCILLARY ONLY
Bacterial Vaginitis (gardnerella): NEGATIVE
Candida Glabrata: NEGATIVE
Candida Vaginitis: POSITIVE — AB
Chlamydia: NEGATIVE
Comment: NEGATIVE
Comment: NEGATIVE
Comment: NEGATIVE
Comment: NEGATIVE
Comment: NEGATIVE
Comment: NORMAL
Neisseria Gonorrhea: NEGATIVE
Trichomonas: NEGATIVE

## 2022-05-15 ENCOUNTER — Other Ambulatory Visit: Payer: Self-pay | Admitting: *Deleted

## 2022-05-15 DIAGNOSIS — B3731 Acute candidiasis of vulva and vagina: Secondary | ICD-10-CM

## 2022-05-15 MED ORDER — FLUCONAZOLE 150 MG PO TABS: 150.0000 mg | ORAL_TABLET | Freq: Once | ORAL | 0 refills | Status: AC

## 2022-05-18 ENCOUNTER — Telehealth: Payer: Self-pay

## 2022-05-18 ENCOUNTER — Other Ambulatory Visit: Payer: Self-pay

## 2022-05-18 DIAGNOSIS — B3731 Acute candidiasis of vulva and vagina: Secondary | ICD-10-CM

## 2022-05-18 MED ORDER — FLUCONAZOLE 150 MG PO TABS: 150.0000 mg | ORAL_TABLET | Freq: Every day | ORAL | 3 refills | Status: DC

## 2022-05-18 NOTE — Telephone Encounter (Signed)
-----   Message from Hermina Staggers, MD sent at 05/14/2022  3:36 PM EDT ----- Please let pt know that her vaginal swab was positive for yeast Send ion Rx for Tx as per protocol. Thanks Casimiro Needle

## 2022-05-18 NOTE — Telephone Encounter (Signed)
I connected with  Danielle [name] on 05/18/22 by telephone and verified that I am speaking with the correct person using two identifiers.  Pt informed of results. Sending rx to pharmacy now.

## 2022-06-27 ENCOUNTER — Ambulatory Visit (INDEPENDENT_AMBULATORY_CARE_PROVIDER_SITE_OTHER): Payer: Medicaid Other | Admitting: Obstetrics

## 2022-06-27 ENCOUNTER — Other Ambulatory Visit (HOSPITAL_COMMUNITY)
Admission: RE | Admit: 2022-06-27 | Discharge: 2022-06-27 | Disposition: A | Discharge status: Home / Self Care | Payer: Medicaid Other | Source: Ambulatory Visit | Attending: Obstetrics | Admitting: Obstetrics

## 2022-06-27 ENCOUNTER — Encounter: Payer: Self-pay | Admitting: Obstetrics

## 2022-06-27 VITALS — BP 123/77 | Ht 68.0 in | Wt 218.9 lb

## 2022-06-27 DIAGNOSIS — N898 Other specified noninflammatory disorders of vagina: Secondary | ICD-10-CM | POA: Diagnosis present

## 2022-06-27 DIAGNOSIS — Z01419 Encounter for gynecological examination (general) (routine) without abnormal findings: Secondary | ICD-10-CM | POA: Diagnosis present

## 2022-06-27 DIAGNOSIS — Z113 Encounter for screening for infections with a predominantly sexual mode of transmission: Secondary | ICD-10-CM | POA: Diagnosis present

## 2022-06-27 NOTE — Progress Notes (Signed)
Subjective:        Zareah Cina is a 37 y.o. female here for a routine exam.  Current complaints: Vaginal discharge.    Personal health questionnaire:  Is patient Ashkenazi Jewish, have a family history of breast and/or ovarian cancer: no Is there a family history of uterine cancer diagnosed at age < 44, gastrointestinal cancer, urinary tract cancer, family member who is a Personnel officer syndrome-associated carrier: no Is the patient overweight and hypertensive, family history of diabetes, personal history of gestational diabetes, preeclampsia or PCOS: no Is patient over 3, have PCOS,  family history of premature CHD under age 57, diabetes, smoke, have hypertension or peripheral artery disease:  no At any time, has a partner hit, kicked or otherwise hurt or frightened you?: no Over the past 2 weeks, have you felt down, depressed or hopeless?: no Over the past 2 weeks, have you felt little interest or pleasure in doing things?:no   Gynecologic History No LMP recorded. Patient has had an ablation. Contraception: tubal ligation Last Pap: 2022. Results were: normal Last mammogram: n/a. Results were: n/a  Obstetric History OB History  Gravida Para Term Preterm AB Living  8 6 6  0 2 6  SAB IAB Ectopic Multiple Live Births  2 0 0 0 6    # Outcome Date GA Lbr Len/2nd Weight Sex Delivery Anes PTL Lv  8 Term 09/11/18 [redacted]w[redacted]d 04:58 / 00:52 7 lb 5.6 oz (3.334 kg) M Vag-Spont EPI  LIV     Birth Comments: wnl  7 Term 10/29/17 [redacted]w[redacted]d 02:11 / 00:04 6 lb 12.3 oz (3.07 kg) M Vag-Spont None  LIV  6 Term 08/21/16 [redacted]w[redacted]d 09:13 / 00:03 7 lb 7.8 oz (3.396 kg) M Vag-Spont None  LIV  5 Term 08/16/14 [redacted]w[redacted]d 04:10 / 00:13 7 lb 10.8 oz (3.48 kg) M Vag-Spont None  LIV  4 Term 08/19/13 [redacted]w[redacted]d  7 lb 12 oz (3.515 kg) M Vag-Spont None  LIV  3 SAB 2013          2 Term 10/05/03 [redacted]w[redacted]d  7 lb 12 oz (3.515 kg) M Vag-Spont None  LIV  1 SAB             Obstetric Comments  Pt states she was induced at [redacted]w[redacted]d for pre-eclampsia  for last pregnacy    Past Medical History:  Diagnosis Date   Anxiety    Depression    Endometriosis    Headache(784.0)    History of abnormal cervical Pap smear    History of pregnancy induced hypertension    Hypertension    Hypothyroidism    Menorrhagia    PTSD (post-traumatic stress disorder)     Past Surgical History:  Procedure Laterality Date   CHOLECYSTECTOMY N/A 04/26/2018   Procedure: LAPAROSCOPIC CHOLECYSTECTOMY WITH POSSIBLE INTRAOPERATIVE CHOLANGIOGRAM;  Surgeon: Sheliah Hatch De Blanch, MD;  Location: [facility];  Service: General;  Laterality: N/A;   COMBINED LAPAROSCOPY W/ HYSTEROSCOPY  04-11-2006   @[facility]    DILITATION & CURRETTAGE/HYSTROSCOPY WITH NOVASURE ABLATION N/A 10/06/2019   Procedure: NOVASURE ABLATION;  Surgeon: Adam Phenix, MD;  Location: [facility];  Service: Gynecology;  Laterality: N/A;   LAPAROSCOPIC GASTRIC SLEEVE RESECTION     LAPAROSCOPIC TUBAL LIGATION Bilateral 11/26/2018   Procedure: LAPAROSCOPIC TUBAL LIGATION;  Surgeon: Catalina Antigua, MD;  Location: [facility];  Service: Gynecology;  Laterality: Bilateral;   TOOTH EXTRACTION       Current Outpatient Medications:    amLODipine (NORVASC) 5 MG tablet, Take  5 mg by mouth daily., Disp: , Rfl:    ARIPiprazole ER (ABILIFY MAINTENA) 400 MG SRER injection, Inject 400 mg into the muscle., Disp: , Rfl:    clonazePAM (KLONOPIN) 0.5 MG tablet, Take 0.5 mg by mouth 2 (two) times daily as needed for anxiety., Disp: , Rfl:    Drospirenone (SLYND) 4 MG TABS, Take 4 mg by mouth daily., Disp: 28 tablet, Rfl: 11   Galcanezumab-gnlm (EMGALITY) 120 MG/ML SOAJ, Inject 120 mg into the skin every 30 (thirty) days., Disp: 1 pen, Rfl: 11   valACYclovir (VALTREX) 500 MG tablet, Take 1 tablet (500 mg total) by mouth 2 (two) times daily., Disp: 6 tablet, Rfl: 11   fluconazole (DIFLUCAN) 150 MG tablet, Take 1 tablet (150 mg total) by mouth daily., Disp: 1 tablet, Rfl: 3   ibuprofen (ADVIL) 600 MG  tablet, Take 1 tablet (600 mg total) by mouth every 6 (six) hours as needed. (Patient not taking: Reported on 06/27/2022), Disp: 30 tablet, Rfl: 1   lidocaine (LIDODERM) 5 %, Place 1 patch onto the skin daily as needed. Apply patch to area most significant pain once per day.  Remove and discard patch within 12 hours of application. (Patient not taking: Reported on 06/27/2022), Disp: 30 patch, Rfl: 0   methocarbamol (ROBAXIN) 500 MG tablet, Take 1 tablet (500 mg total) by mouth every 8 (eight) hours as needed for muscle spasms. (Patient not taking: Reported on 05/11/2022), Disp: 15 tablet, Rfl: 0   metroNIDAZOLE (FLAGYL) 500 MG tablet, Take 1 tablet (500 mg total) by mouth 2 (two) times daily. (Patient not taking: Reported on 05/11/2022), Disp: 14 tablet, Rfl: 2   metroNIDAZOLE (FLAGYL) 500 MG tablet, Take 1 tablet (500 mg total) by mouth 2 (two) times daily. (Patient not taking: Reported on 05/11/2022), Disp: 14 tablet, Rfl: 0   metroNIDAZOLE (FLAGYL) 500 MG tablet, Take 1 tablet (500 mg total) by mouth 2 (two) times daily. (Patient not taking: Reported on 05/11/2022), Disp: 14 tablet, Rfl: 0 Allergies  Allergen Reactions   Latex Hives and Rash    Social History   Tobacco Use   Smoking status: Never   Smokeless tobacco: Never  Substance Use Topics   Alcohol use: Not Currently    Comment: occas.     Family History  Problem Relation Age of Onset   Healthy Mother    Diabetes Father    Hypertension Father    Diabetes Maternal Grandmother    Cancer Maternal Grandmother    Diabetes Maternal Grandfather    Diabetes Paternal Grandmother    Diabetes Paternal Grandfather    Other Cousin        sickle cell disease   Arthritis Other    Asthma Other    Early death Other        uncle-suicide   Cataracts Other    Congestive Heart Failure Other    Hyperlipidemia Other    Hypertension Other    Stroke Other    Anesthesia problems Neg Hx    Migraines Neg Hx       Review of Systems  Constitutional:  negative for fatigue and weight loss Respiratory: negative for cough and wheezing Cardiovascular: negative for chest pain, fatigue and palpitations Gastrointestinal: negative for abdominal pain and change in bowel habits Musculoskeletal:negative for myalgias Neurological: negative for gait problems and tremors Behavioral/Psych: negative for abusive relationship, depression Endocrine: negative for temperature intolerance    Genitourinary:negative for abnormal menstrual periods, genital lesions, hot flashes, sexual problems and vaginal discharge Integument/breast:  negative for breast lump, breast tenderness, nipple discharge and skin lesion(s)    Objective:       BP 123/77   Ht 5\' 8"  (1.727 m)   Wt 218 lb 14.4 oz (99.3 kg)   BMI 33.28 kg/m  General:   Alert and no distress  Skin:   no rash or abnormalities  Lungs:   clear to auscultation bilaterally  Heart:   regular rate and rhythm, S1, S2 normal, no murmur, click, rub or gallop  Breasts:   normal without suspicious masses, skin or nipple changes or axillary nodes  Abdomen:  normal findings: no organomegaly, soft, non-tender and no hernia  Pelvis:  External genitalia: normal general appearance Urinary system: urethral meatus normal and bladder without fullness, nontender Vaginal: normal without tenderness, induration or masses Cervix: normal appearance Adnexa: normal bimanual exam Uterus: anteverted and non-tender, normal size   Lab Review Urine pregnancy test Labs reviewed yes Radiologic studies reviewed no  I have spent a total of 20 minutes of face-to-face time, excluding clinical staff time, reviewing notes and preparing to see patient, ordering tests and/or medications, and counseling the patient.   Assessment:    1. Encounter for routine gynecological examination with Papanicolaou smear of cervix Rx: - Cytology - PAP( [place])  2. Vaginal discharge Rx: - Cervicovaginal ancillary only( [place])  3. Screen  for STD (sexually transmitted disease) Rx: - Cervicovaginal ancillary only( [place]) - HIV Antibody (routine testing w rflx) - RPR - Hepatitis B Surface AntiGEN - Hepatitis C Antibody    Plan:    Education reviewed: calcium supplements, depression evaluation, low fat, low cholesterol diet, safe sex/STD prevention, self breast exams, and weight bearing exercise. Follow up in: 1 year.    Orders Placed This Encounter  Procedures   HIV Antibody (routine testing w rflx)   RPR   Hepatitis B Surface AntiGEN   Hepatitis C Antibody    Brock Bad, MD 06/27/2022 4:32 PM

## 2022-06-27 NOTE — Progress Notes (Signed)
Patient presents with concerns of boil at vaginal orifice that was painful to touch. First noticed last Friday. Reports it resolved yesterday. Desires STD screen, has increased discharge with irritation but denies itching or odor.

## 2022-06-28 LAB — HEPATITIS C ANTIBODY: Hep C Virus Ab: REACTIVE — AB

## 2022-06-28 LAB — RPR: RPR Ser Ql: NONREACTIVE

## 2022-06-28 LAB — HIV ANTIBODY (ROUTINE TESTING W REFLEX): HIV Screen 4th Generation wRfx: NONREACTIVE

## 2022-06-28 LAB — HEPATITIS B SURFACE ANTIGEN: Hepatitis B Surface Ag: NEGATIVE

## 2022-06-29 LAB — CERVICOVAGINAL ANCILLARY ONLY
Bacterial Vaginitis (gardnerella): POSITIVE — AB
Candida Glabrata: NEGATIVE
Candida Vaginitis: NEGATIVE
Chlamydia: NEGATIVE
Comment: NEGATIVE
Comment: NEGATIVE
Comment: NEGATIVE
Comment: NEGATIVE
Comment: NEGATIVE
Comment: NORMAL
Neisseria Gonorrhea: NEGATIVE
Trichomonas: NEGATIVE

## 2022-07-03 ENCOUNTER — Other Ambulatory Visit: Payer: Self-pay | Admitting: Obstetrics

## 2022-07-03 DIAGNOSIS — R768 Other specified abnormal immunological findings in serum: Secondary | ICD-10-CM

## 2022-07-03 DIAGNOSIS — B9689 Other specified bacterial agents as the cause of diseases classified elsewhere: Secondary | ICD-10-CM

## 2022-07-03 MED ORDER — METRONIDAZOLE 500 MG PO TABS: 500.0000 mg | ORAL_TABLET | Freq: Two times a day (BID) | ORAL | 2 refills | Status: DC

## 2022-07-04 ENCOUNTER — Encounter: Payer: Self-pay | Admitting: Obstetrics

## 2022-07-04 LAB — CYTOLOGY - PAP: Diagnosis: NEGATIVE

## 2022-07-05 ENCOUNTER — Telehealth: Payer: Self-pay

## 2022-07-05 ENCOUNTER — Other Ambulatory Visit: Payer: Self-pay

## 2022-07-05 DIAGNOSIS — R768 Other specified abnormal immunological findings in serum: Secondary | ICD-10-CM

## 2022-07-05 NOTE — Telephone Encounter (Signed)
  left VMM to call our Office.  Pt needs additional blood draw per Provider request.

## 2022-07-18 LAB — HCV RNA QUANT: Hepatitis C Quantitation: NOT DETECTED IU/mL

## 2022-07-18 LAB — SPECIMEN STATUS REPORT

## 2022-08-20 ENCOUNTER — Other Ambulatory Visit (HOSPITAL_COMMUNITY)
Admission: RE | Admit: 2022-08-20 | Discharge: 2022-08-20 | Disposition: A | Discharge status: Home / Self Care | Payer: MEDICAID | Source: Ambulatory Visit | Attending: Student | Admitting: Student

## 2022-08-20 ENCOUNTER — Ambulatory Visit (INDEPENDENT_AMBULATORY_CARE_PROVIDER_SITE_OTHER): Payer: MEDICAID | Admitting: *Deleted

## 2022-08-20 VITALS — BP 138/86 | HR 71

## 2022-08-20 DIAGNOSIS — Z113 Encounter for screening for infections with a predominantly sexual mode of transmission: Secondary | ICD-10-CM

## 2022-08-20 NOTE — Progress Notes (Signed)
SUBJECTIVE:  37 y.o. female who desires a STI screen. Denies abnormal vaginal discharge, bleeding or significant pelvic pain. No UTI symptoms. Denies history of known exposure to STD.  No LMP recorded. Patient has had an ablation.  OBJECTIVE:  She appears well.   ASSESSMENT:  STI Screen   PLAN:  Pt offered STI blood screening-requested GC, chlamydia, and trichomonas probe sent to lab.  Treatment: To be determined once lab results are received.  Pt follow up as needed.  Hep C omitted. 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 Consulted with Dr. Debroah Loop. He recommends omitting Hep C today and pt advised to follow up with PCP for Hep C DX.

## 2022-08-21 LAB — CERVICOVAGINAL ANCILLARY ONLY
Bacterial Vaginitis (gardnerella): POSITIVE — AB
Candida Glabrata: NEGATIVE
Candida Vaginitis: POSITIVE — AB
Chlamydia: NEGATIVE
Comment: NEGATIVE
Comment: NEGATIVE
Comment: NEGATIVE
Comment: NEGATIVE
Comment: NEGATIVE
Comment: NORMAL
Neisseria Gonorrhea: NEGATIVE
Trichomonas: NEGATIVE

## 2022-08-21 LAB — RPR: RPR Ser Ql: NONREACTIVE

## 2022-08-21 LAB — HIV ANTIBODY (ROUTINE TESTING W REFLEX): HIV Screen 4th Generation wRfx: NONREACTIVE

## 2022-08-21 LAB — HEPATITIS B SURFACE ANTIGEN: Hepatitis B Surface Ag: NEGATIVE

## 2022-08-22 ENCOUNTER — Other Ambulatory Visit: Payer: Self-pay

## 2022-08-22 ENCOUNTER — Encounter: Payer: Self-pay | Admitting: Advanced Practice Midwife

## 2022-08-22 DIAGNOSIS — B9689 Other specified bacterial agents as the cause of diseases classified elsewhere: Secondary | ICD-10-CM

## 2022-08-22 DIAGNOSIS — B379 Candidiasis, unspecified: Secondary | ICD-10-CM

## 2022-08-22 MED ORDER — FLUCONAZOLE 150 MG PO TABS: 150.0000 mg | ORAL_TABLET | Freq: Every day | ORAL | 3 refills | Status: DC

## 2022-08-22 MED ORDER — METRONIDAZOLE 500 MG PO TABS: 500.0000 mg | ORAL_TABLET | Freq: Two times a day (BID) | ORAL | 0 refills | Status: AC

## 2022-10-22 ENCOUNTER — Ambulatory Visit (INDEPENDENT_AMBULATORY_CARE_PROVIDER_SITE_OTHER): Payer: MEDICAID | Admitting: Obstetrics and Gynecology

## 2022-10-22 ENCOUNTER — Other Ambulatory Visit (HOSPITAL_COMMUNITY)
Admission: RE | Admit: 2022-10-22 | Discharge: 2022-10-22 | Disposition: A | Discharge status: Home / Self Care | Payer: MEDICAID | Source: Ambulatory Visit | Attending: Obstetrics and Gynecology | Admitting: Obstetrics and Gynecology

## 2022-10-22 VITALS — BP 132/78 | HR 79 | Ht 68.0 in | Wt 224.0 lb

## 2022-10-22 DIAGNOSIS — B9689 Other specified bacterial agents as the cause of diseases classified elsewhere: Secondary | ICD-10-CM | POA: Insufficient documentation

## 2022-10-22 DIAGNOSIS — Z113 Encounter for screening for infections with a predominantly sexual mode of transmission: Secondary | ICD-10-CM | POA: Diagnosis present

## 2022-10-22 DIAGNOSIS — R21 Rash and other nonspecific skin eruption: Secondary | ICD-10-CM

## 2022-10-22 DIAGNOSIS — N760 Acute vaginitis: Secondary | ICD-10-CM | POA: Diagnosis not present

## 2022-10-22 DIAGNOSIS — B3731 Acute candidiasis of vulva and vagina: Secondary | ICD-10-CM | POA: Insufficient documentation

## 2022-10-22 NOTE — Progress Notes (Signed)
Pt presents to discuss bumps on lips on face for 1 week. Denies itching, tingling but endorses burning. Concerned for HSV.

## 2022-10-22 NOTE — Progress Notes (Signed)
CC: STI check Subjective:    Patient ID: Danielle [name], female    DOB: Jun 07, 1985, 37 y.o.   MRN: [id number]  HPI 37 yo G8P6 seen for STI check.  Pt concerned about a "rash" around her mouth.  She does not report any high risk behaviors.  Pt never saw any vesicles or ulcers around her mouth.  Pt has never had genital HSV either.  Pt was concerned because the skin around her mouth is now hypopigmented and previously was itchy.   Review of Systems     Objective:   Physical Exam Vitals:   10/22/22 1613  BP: 132/78  Pulse: 79   Lightly hypopigmented area rimming the lips.  No obvious herpetic lesions noted.      Assessment & Plan:   1. Routine screening for STI (sexually transmitted infection) STD check per patient  - Hepatitis C antibody - Hepatitis B surface antigen - Cervicovaginal ancillary only - RPR - HIV Antibody (routine testing w rflx)  2. Facial rash Do not believe hypopigmentation is from HSV, no HSV labs or culture ordered.   I spent 10 minutes dedicated to the care of this patient including previsit review of records, face to face time with the patient discussing testing option .   Pap is current , follow up prn  Warden Fillers, MD Faculty Attending, Center for [facility]

## 2022-10-23 LAB — RPR: RPR Ser Ql: NONREACTIVE

## 2022-10-23 LAB — HEPATITIS C ANTIBODY: Hep C Virus Ab: NONREACTIVE

## 2022-10-23 LAB — HIV ANTIBODY (ROUTINE TESTING W REFLEX): HIV Screen 4th Generation wRfx: NONREACTIVE

## 2022-10-23 LAB — HEPATITIS B SURFACE ANTIGEN: Hepatitis B Surface Ag: NEGATIVE

## 2022-10-25 ENCOUNTER — Other Ambulatory Visit: Payer: Self-pay

## 2022-10-25 DIAGNOSIS — N760 Acute vaginitis: Secondary | ICD-10-CM

## 2022-10-25 DIAGNOSIS — B379 Candidiasis, unspecified: Secondary | ICD-10-CM

## 2022-10-25 MED ORDER — METRONIDAZOLE 500 MG PO TABS
500.0000 mg | ORAL_TABLET | Freq: Two times a day (BID) | ORAL | 0 refills | Status: DC
Start: 2022-10-25 — End: 2023-01-11

## 2022-10-25 MED ORDER — FLUCONAZOLE 150 MG PO TABS
150.0000 mg | ORAL_TABLET | Freq: Once | ORAL | 0 refills | Status: AC
Start: 2022-10-25 — End: 2022-10-25

## 2023-01-07 ENCOUNTER — Ambulatory Visit (INDEPENDENT_AMBULATORY_CARE_PROVIDER_SITE_OTHER): Payer: MEDICAID | Admitting: *Deleted

## 2023-01-07 ENCOUNTER — Other Ambulatory Visit (HOSPITAL_COMMUNITY)
Admission: RE | Admit: 2023-01-07 | Discharge: 2023-01-07 | Disposition: A | Discharge status: Home / Self Care | Payer: MEDICAID | Source: Ambulatory Visit | Attending: Obstetrics & Gynecology | Admitting: Obstetrics & Gynecology

## 2023-01-07 VITALS — BP 145/80 | HR 85

## 2023-01-07 DIAGNOSIS — N898 Other specified noninflammatory disorders of vagina: Secondary | ICD-10-CM | POA: Insufficient documentation

## 2023-01-07 DIAGNOSIS — N9089 Other specified noninflammatory disorders of vulva and perineum: Secondary | ICD-10-CM | POA: Diagnosis not present

## 2023-01-07 DIAGNOSIS — Z113 Encounter for screening for infections with a predominantly sexual mode of transmission: Secondary | ICD-10-CM

## 2023-01-07 NOTE — Progress Notes (Signed)
SUBJECTIVE:  37 y.o. female complains of white, creamy, and thick vaginal discharge for 3 day(s). Denies abnormal vaginal bleeding or significant pelvic pain or fever. No UTI symptoms. Denies history of known exposure to STD.  No LMP recorded. Patient has had an ablation.  OBJECTIVE:  She appears well, afebrile. Urine dipstick: not done.  ASSESSMENT:  Vaginal Discharge  Vaginal Odor   PLAN:  GC, chlamydia, trichomonas, BVAG, CVAG probe sent to lab. Treatment: To be determined once lab results are received ROV prn if symptoms persist or worsen.  SUBJECTIVE:  37 y.o. female who desires a STI screen. Denies abnormal vaginal discharge, bleeding or significant pelvic pain. No UTI symptoms. Denies history of known exposure to STD.  No LMP recorded. Patient has had an ablation.  OBJECTIVE:  She appears well.   ASSESSMENT:  STI Screen   PLAN:  Pt offered STI blood screening-requested GC, chlamydia, and trichomonas probe sent to lab.  Treatment: To be determined once lab results are received.  Pt follow up as needed.

## 2023-01-08 LAB — HEPATITIS C ANTIBODY: Hep C Virus Ab: NONREACTIVE

## 2023-01-08 LAB — HIV ANTIBODY (ROUTINE TESTING W REFLEX): HIV Screen 4th Generation wRfx: NONREACTIVE

## 2023-01-08 LAB — HEPATITIS B SURFACE ANTIGEN: Hepatitis B Surface Ag: NEGATIVE

## 2023-01-08 LAB — RPR: RPR Ser Ql: NONREACTIVE

## 2023-01-11 ENCOUNTER — Other Ambulatory Visit: Payer: Self-pay | Admitting: *Deleted

## 2023-01-11 DIAGNOSIS — N760 Acute vaginitis: Secondary | ICD-10-CM

## 2023-01-11 LAB — CERVICOVAGINAL ANCILLARY ONLY
Bacterial Vaginitis (gardnerella): POSITIVE — AB
Candida Glabrata: NEGATIVE
Candida Vaginitis: NEGATIVE
Chlamydia: NEGATIVE
Comment: NEGATIVE
Comment: NEGATIVE
Comment: NEGATIVE
Comment: NEGATIVE
Comment: NEGATIVE
Comment: NORMAL
Neisseria Gonorrhea: NEGATIVE
Trichomonas: NEGATIVE

## 2023-01-11 MED ORDER — METRONIDAZOLE 500 MG PO TABS
500.0000 mg | ORAL_TABLET | Freq: Two times a day (BID) | ORAL | 0 refills | Status: DC
Start: 2023-01-11 — End: 2023-02-09

## 2023-01-11 NOTE — Progress Notes (Signed)
Flagyl sent today for +BV. Pt made aware.  See lab results

## 2023-02-05 ENCOUNTER — Ambulatory Visit: Payer: MEDICAID | Admitting: Obstetrics and Gynecology

## 2023-02-05 ENCOUNTER — Institutional Professional Consult (permissible substitution): Payer: MEDICAID | Admitting: Obstetrics and Gynecology

## 2023-02-05 ENCOUNTER — Encounter: Payer: Self-pay | Admitting: Obstetrics and Gynecology

## 2023-02-05 VITALS — BP 139/84 | HR 88 | Ht 68.0 in | Wt 218.0 lb

## 2023-02-05 DIAGNOSIS — Z91199 Patient's noncompliance with other medical treatment and regimen due to unspecified reason: Secondary | ICD-10-CM

## 2023-02-05 NOTE — Progress Notes (Deleted)
 Marland Kitchen

## 2023-02-05 NOTE — Progress Notes (Signed)
 Wants to discuss adding med for libido? No C/O today.

## 2023-02-06 NOTE — Progress Notes (Signed)
 Left and rescheduled before being seen by provider

## 2023-02-07 ENCOUNTER — Institutional Professional Consult (permissible substitution): Payer: MEDICAID | Admitting: Obstetrics and Gynecology

## 2023-02-09 ENCOUNTER — Inpatient Hospital Stay (HOSPITAL_COMMUNITY)
Admission: AD | Admit: 2023-02-09 | Discharge: 2023-02-09 | Disposition: A | Discharge status: Home / Self Care | Payer: MEDICAID | Attending: Obstetrics & Gynecology | Admitting: Obstetrics & Gynecology

## 2023-02-09 DIAGNOSIS — N898 Other specified noninflammatory disorders of vagina: Secondary | ICD-10-CM | POA: Diagnosis present

## 2023-02-09 DIAGNOSIS — L739 Follicular disorder, unspecified: Secondary | ICD-10-CM | POA: Diagnosis not present

## 2023-02-09 NOTE — MAU Provider Note (Signed)
 Chief Complaint: No chief complaint on file.   Event Date/Time   First Provider Initiated Contact with Patient 02/09/23 1000      SUBJECTIVE HPI: Danielle [name] is a 38 y.o. [id number] who presents to maternity admissions for a bump on my mons pubis. She is not pregnant.   Past Medical History:  Diagnosis Date   Anxiety    Depression    Endometriosis    Headache(784.0)    History of abnormal cervical Pap smear    History of pregnancy induced hypertension    Hypertension    Hypothyroidism    Menorrhagia    PTSD (post-traumatic stress disorder)    Past Surgical History:  Procedure Laterality Date   CHOLECYSTECTOMY N/A 04/26/2018   Procedure: LAPAROSCOPIC CHOLECYSTECTOMY WITH POSSIBLE INTRAOPERATIVE CHOLANGIOGRAM;  Surgeon: Stevie Herlene Righter, MD;  Location: [facility];  Service: General;  Laterality: N/A;   COMBINED LAPAROSCOPY W/ HYSTEROSCOPY  04-11-2006   @[facility]    DILITATION & CURRETTAGE/HYSTROSCOPY WITH NOVASURE ABLATION N/A 10/06/2019   Procedure: NOVASURE ABLATION;  Surgeon: Eveline Lynwood MATSU, MD;  Location: [facility];  Service: Gynecology;  Laterality: N/A;   LAPAROSCOPIC GASTRIC SLEEVE RESECTION     LAPAROSCOPIC TUBAL LIGATION Bilateral 11/26/2018   Procedure: LAPAROSCOPIC TUBAL LIGATION;  Surgeon: Alger Gong, MD;  Location: [facility];  Service: Gynecology;  Laterality: Bilateral;   TOOTH EXTRACTION     Social History   Socioeconomic History   Marital status: Divorced    Spouse name: Not on file   Number of children: 6   Years of education: Not on file   Highest education level: High school graduate  Occupational History   Occupation: Conservation Officer, [employer]  Tobacco Use   Smoking status: Never   Smokeless tobacco: Never  Vaping Use   Vaping status: Never Used  Substance and Sexual Activity   Alcohol use: Not Currently    Comment: occas.    Drug use: Not Currently    Comment: no longer, last was first wk Nov   Sexual activity: Yes    Birth  control/protection: Surgical  Other Topics Concern   Not on file  Social History Narrative   Lives at home with her children   Right handed   Caffeine : 0-1 cups/day   Social Drivers of Corporate Investment Banker Strain: Low Risk  (09/10/2018)   Overall Financial Resource Strain (CARDIA)    Difficulty of Paying Living Expenses: Not hard at all  Food Insecurity: No Food Insecurity (07/04/2021)   [facility] to [facility]07[facility] visits prior to 04/07/2022., [facility]   Hunger Vital Sign    Worried About Running Out of Food in the Last Year: Never true    Ran Out of Food in the Last Year: Never true  Transportation Needs: No Transportation Needs (09/10/2018)   PRAPARE - Administrator, Civil Service (Medical): No    Lack of Transportation (Non-Medical): No  Physical Activity: Insufficiently Active (09/10/2018)   Exercise Vital Sign    Days of Exercise per Week: 2 days    Minutes of Exercise per Session: 30 min  Stress: No Stress Concern Present (09/10/2018)   [employer] of Occupational Health - Occupational Stress Questionnaire    Feeling of Stress : Not at all  Social Connections: Socially Isolated (09/10/2018)   Social Connection and Isolation Panel [NHANES]    Frequency of Communication with Friends and Family: More than three  times a week    Frequency of Social Gatherings with Friends and Family: Three times a week    Attends Religious Services: Never    Active Member of Clubs or Organizations: No    Attends Banker Meetings: Never    Marital Status: Divorced  Catering Manager Violence: Not At Risk (09/10/2018)   Humiliation, Afraid, Rape, and Kick questionnaire    Fear of Current or Ex-Partner: No    Emotionally Abused: No    Physically Abused: No    Sexually Abused: No   No current facility-administered medications on file prior to encounter.   Current  Outpatient Medications on File Prior to Encounter  Medication Sig Dispense Refill   amLODipine  (NORVASC ) 5 MG tablet Take 5 mg by mouth daily. (Patient not taking: Reported on 02/05/2023)     ARIPiprazole  ER (ABILIFY  MAINTENA) 400 MG SRER injection Inject 400 mg into the muscle.     clonazePAM (KLONOPIN) 0.5 MG tablet Take 0.5 mg by mouth 2 (two) times daily as needed for anxiety.     Drospirenone  (SLYND ) 4 MG TABS Take 4 mg by mouth daily. (Patient not taking: Reported on 02/05/2023) 28 tablet 11   fluconazole  (DIFLUCAN ) 150 MG tablet Take 1 tablet (150 mg total) by mouth daily. (Patient not taking: Reported on 02/05/2023) 1 tablet 3   Galcanezumab -gnlm (EMGALITY ) 120 MG/ML SOAJ Inject 120 mg into the skin every 30 (thirty) days. (Patient not taking: Reported on 10/22/2022) 1 pen 11   ibuprofen  (ADVIL ) 600 MG tablet Take 1 tablet (600 mg total) by mouth every 6 (six) hours as needed. (Patient not taking: Reported on 06/27/2022) 30 tablet 1   lidocaine  (LIDODERM ) 5 % Place 1 patch onto the skin daily as needed. Apply patch to area most significant pain once per day.  Remove and discard patch within 12 hours of application. (Patient not taking: Reported on 06/27/2022) 30 patch 0   methocarbamol  (ROBAXIN ) 500 MG tablet Take 1 tablet (500 mg total) by mouth every 8 (eight) hours as needed for muscle spasms. (Patient not taking: Reported on 05/11/2022) 15 tablet 0   metroNIDAZOLE  (FLAGYL ) 500 MG tablet Take 1 tablet (500 mg total) by mouth 2 (two) times daily. (Patient not taking: Reported on 02/05/2023) 14 tablet 0   valACYclovir  (VALTREX ) 500 MG tablet Take 1 tablet (500 mg total) by mouth 2 (two) times daily. (Patient not taking: Reported on 10/22/2022) 6 tablet 11   Allergies  Allergen Reactions   Latex Hives and Rash    ROS:  Review of Systems  I have reviewed patient's Past Medical Hx, Surgical Hx, Family Hx, Social Hx, medications and allergies.   Physical Exam  Patient Vitals for the past 24  hrs:  BP Temp Temp src Pulse Resp SpO2 Height Weight  02/09/23 0951 131/86 98.1 F (36.7 C) Oral 71 16 99 % 5' 8 (1.727 m) 97.5 kg   Physical Exam Constitutional:      General: She is not in acute distress.    Appearance: Normal appearance. She is not ill-appearing, toxic-appearing or diaphoretic.  Genitourinary:    Comments: Non fluctuant mons pubis folliculitis. No erythema, minimal pain with examination.  Neurological:     Mental Status: She is alert.   MDM Patient denies any concerning symptoms in need of emergent evaluation. Not fluctuant enough to I&D.  ASSESSMENT  MSE Complete 1. Vaginal cyst   2. Folliculitis      PLAN  Dc home Warm compresses encouraged If erythema occurs go  to urgent care.   Dorita Nest I, NP 02/09/2023 10:06 AM

## 2023-02-09 NOTE — MAU Note (Signed)
 Danielle [name] is a 38 y.o. at Unknown here in MAU reporting: was unsure if this was the correct place to come, is not pregnant.  Has a cyst at the top of her vagina.  Further questioning, is on her mons, was about the size of a pimple when noted on Tues, got bigger yesterday, maybe dime sized.  Is not red. Has not drainage. Tender. LMP: 12/28 Onset of complaint: 12/31 Pain score: 6 Vitals:   02/09/23 0951  BP: 131/86  Pulse: 71  Resp: 16  Temp: 98.1 F (36.7 C)  SpO2: 99%      Lab orders placed from triage:

## 2023-03-02 ENCOUNTER — Other Ambulatory Visit: Payer: Self-pay | Admitting: Family Medicine

## 2023-03-02 DIAGNOSIS — B009 Herpesviral infection, unspecified: Secondary | ICD-10-CM

## 2023-03-12 ENCOUNTER — Other Ambulatory Visit (HOSPITAL_COMMUNITY)
Admission: RE | Admit: 2023-03-12 | Discharge: 2023-03-12 | Disposition: A | Discharge status: Home / Self Care | Payer: MEDICAID | Source: Ambulatory Visit | Attending: Obstetrics and Gynecology | Admitting: Obstetrics and Gynecology

## 2023-03-12 ENCOUNTER — Ambulatory Visit: Payer: MEDICAID

## 2023-03-12 VITALS — BP 142/86 | HR 69

## 2023-03-12 DIAGNOSIS — N898 Other specified noninflammatory disorders of vagina: Secondary | ICD-10-CM

## 2023-03-12 NOTE — Progress Notes (Signed)
 SUBJECTIVE:  38 y.o. female complains of white and creamy vaginal discharge for 3 day(s). Denies abnormal vaginal bleeding or significant pelvic pain or fever. No UTI symptoms. Denies history of known exposure to STD.  No LMP recorded.  OBJECTIVE:  She appears well, afebrile. Urine dipstick: not done.  ASSESSMENT:  Vaginal Discharge  Vaginal Odor   PLAN:  GC, chlamydia, trichomonas, BVAG, CVAG probe sent to lab. Treatment: To be determined once lab results are received ROV prn if symptoms persist or worsen.

## 2023-03-14 LAB — CERVICOVAGINAL ANCILLARY ONLY
Bacterial Vaginitis (gardnerella): POSITIVE — AB
Candida Glabrata: NEGATIVE
Candida Vaginitis: NEGATIVE
Chlamydia: NEGATIVE
Comment: NEGATIVE
Comment: NEGATIVE
Comment: NEGATIVE
Comment: NEGATIVE
Comment: NEGATIVE
Comment: NORMAL
Neisseria Gonorrhea: NEGATIVE
Trichomonas: NEGATIVE

## 2023-03-29 ENCOUNTER — Other Ambulatory Visit: Payer: Self-pay

## 2023-03-29 DIAGNOSIS — B9689 Other specified bacterial agents as the cause of diseases classified elsewhere: Secondary | ICD-10-CM

## 2023-03-29 MED ORDER — METRONIDAZOLE 500 MG PO TABS: 500.0000 mg | ORAL_TABLET | Freq: Two times a day (BID) | ORAL | 0 refills | Status: DC

## 2023-04-15 ENCOUNTER — Other Ambulatory Visit (HOSPITAL_COMMUNITY)
Admission: RE | Admit: 2023-04-15 | Discharge: 2023-04-15 | Disposition: A | Discharge status: Home / Self Care | Payer: MEDICAID | Source: Ambulatory Visit | Attending: Obstetrics & Gynecology | Admitting: Obstetrics & Gynecology

## 2023-04-15 ENCOUNTER — Ambulatory Visit: Payer: MEDICAID

## 2023-04-15 VITALS — BP 132/86 | HR 88

## 2023-04-15 DIAGNOSIS — N898 Other specified noninflammatory disorders of vagina: Secondary | ICD-10-CM

## 2023-04-15 NOTE — Progress Notes (Signed)
 SUBJECTIVE:  38 y.o. female complains of white vaginal discharge for 2 day(s). Denies abnormal vaginal bleeding or significant pelvic pain or fever. No UTI symptoms. Denies history of known exposure to STD.  No LMP recorded.  OBJECTIVE: She appears well, afebrile. Urine dipstick: not done.  ASSESSMENT:  Vaginal Discharge  Vaginal Odor Vaginal itching  PLAN:  GC, chlamydia, trichomonas, BVAG, CVAG probe sent to lab. Treatment: To be determined once lab results are received ROV prn if symptoms persist or worsen.

## 2023-04-17 ENCOUNTER — Other Ambulatory Visit: Payer: Self-pay | Admitting: Obstetrics and Gynecology

## 2023-04-17 ENCOUNTER — Encounter: Payer: Self-pay | Admitting: Obstetrics and Gynecology

## 2023-04-17 LAB — CERVICOVAGINAL ANCILLARY ONLY
Bacterial Vaginitis (gardnerella): POSITIVE — AB
Candida Glabrata: NEGATIVE
Candida Vaginitis: POSITIVE — AB
Chlamydia: NEGATIVE
Comment: NEGATIVE
Comment: NEGATIVE
Comment: NEGATIVE
Comment: NEGATIVE
Comment: NEGATIVE
Comment: NORMAL
Neisseria Gonorrhea: NEGATIVE
Trichomonas: NEGATIVE

## 2023-04-17 MED ORDER — FLUCONAZOLE 150 MG PO TABS: 150.0000 mg | ORAL_TABLET | Freq: Once | ORAL | 0 refills | Status: DC

## 2023-04-17 MED ORDER — METRONIDAZOLE 0.75 % VA GEL: 1.0000 | Freq: Every day | VAGINAL | 1 refills | Status: DC

## 2023-06-04 NOTE — Progress Notes (Unsigned)
 Note in error

## 2023-06-05 ENCOUNTER — Encounter: Payer: Self-pay | Admitting: Obstetrics and Gynecology

## 2023-06-05 ENCOUNTER — Ambulatory Visit: Payer: MEDICAID | Admitting: Obstetrics and Gynecology

## 2023-06-05 VITALS — BP 127/82 | HR 84 | Ht 68.0 in | Wt 211.0 lb

## 2023-06-05 DIAGNOSIS — N946 Dysmenorrhea, unspecified: Secondary | ICD-10-CM

## 2023-06-05 MED ORDER — OXYCODONE HCL 5 MG PO TABS: 5.0000 mg | ORAL_TABLET | Freq: Four times a day (QID) | ORAL | 0 refills | Status: AC | PRN

## 2023-06-05 MED ORDER — LEVONORGEST-ETH ESTRAD 91-DAY 0.15-0.03 &0.01 MG PO TABS: 1.0000 | ORAL_TABLET | Freq: Every day | ORAL | 4 refills | Status: DC

## 2023-06-05 NOTE — Progress Notes (Signed)
  CC: painful periods Subjective:    Patient ID: Danielle [name], female    DOB: 09-15-1985, 38 y.o.   MRN: [id number]  HPI 38 yo [id number] seen for discussion of painful menses.  Pt is s/p gastric sleeve and multiple laparoscopies.  Pt had uterine ablation in 2021 which seems to be at least partially successful.  Pt still has heavy bleeding on day 1 but then light spotting for up to 8-10 days.  Pt still has extreme pain with her menses.  She has been taking tylenol  which has not been very effective.  Pt cannot take NSAIDs due to history of gastric sleeve.  Pt also noted previous diagnosis of endometriosis.   Review of Systems     Objective:   Physical Exam Vitals:   06/05/23 1433  BP: 127/82  Pulse: 84         Assessment & Plan:   1. Dysmenorrhea (Primary) Discussed multiple options for treatment. NSAIDS cannot be used.  Offered small rx of oxycodone  or flexeril  Will pursue extended regime OCPs in the hope that if patient has no cycle there will be no pain.    If ineffective can consider GnRH agonists as well to treat endometriosis and hopefully get amenorrhea as well.  - Levonorgestrel-Ethinyl Estradiol (SEASONIQUE) 0.15-0.03 &0.01 MG tablet; Take 1 tablet by mouth daily.  Dispense: 91 tablet; Refill: 4 - oxyCODONE  (OXY IR/ROXICODONE ) 5 MG immediate release tablet; Take 1 tablet (5 mg total) by mouth every 6 (six) hours as needed for severe pain (pain score 7-10) or breakthrough pain.  Dispense: 12 tablet; Refill: 0  F/u in 4 months with virtual visit visit I spent 30 minutes dedicated to the care of this patient including previsit review of records, face to face time with the patient discussing complaint etiology, treatment options and post visit testing.    Abigail Abler, MD Faculty Attending, Center for [facility]

## 2023-06-05 NOTE — Progress Notes (Signed)
 38 y.o. GYN presents for vaginal bleeding/periods lasting 8-10 days

## 2023-06-06 ENCOUNTER — Encounter: Payer: Self-pay | Admitting: Advanced Practice Midwife

## 2023-06-06 DIAGNOSIS — B009 Herpesviral infection, unspecified: Secondary | ICD-10-CM

## 2023-06-06 MED ORDER — VALACYCLOVIR HCL 500 MG PO TABS: 500.0000 mg | ORAL_TABLET | Freq: Two times a day (BID) | ORAL | 11 refills | Status: DC

## 2023-06-25 NOTE — Progress Notes (Signed)
 Medical Visit Program: Surgery  Stage: s/p sleeve  Plan: 3 month follow up    06/25/2023  CC: Danielle [name] returns to follow up on treatment of excess body weight and associated risk factors/co-morbidities  Date of Surgery: 07/04/2021 Type of Surgery: Sleeve Initial Weight: 280 Last Weight: 216 Today's Weight: 206 Weight Loss Since Last Visit: 10 Total Weight Loss to Date: 47  Interim history: History of Present Illness The patient presents for a follow-up visit since starting on Wegovy.  She reports a positive experience with Wegovy, noting a significant decrease in her appetite. She has successfully lost 10 pounds without experiencing any adverse effects such as nausea, reflux, belching, or constipation. She has completed the 1 mg dosage of Wegovy and is open to increasing the dose. Her total weight loss to date is 74 pounds since sleeve, and she expresses satisfaction with her progress. She has had a consultation with plastic surgeon Dr. Alm and is currently awaiting authorization to schedule her procedure. She has submitted requests for all three procedures and is awaiting approval. She anticipates scheduling the procedure for the upcoming summer. Her dietary habits have changed, with a reduction in food intake during work hours. She now consumes a protein shake for breakfast and another before bedtime, occasionally supplementing with yogurt. She no longer eats at work, which typically lasts for 6 hours. She continues to consume a balanced diet, including grilled chicken or salmon, after work. She finds it challenging to resist certain foods when visiting her grandmother's house or when coworkers bring food to work. She has been more consistent with her vitamin intake since reviewing her lab results. She takes vitamin D  once a week and sublingual B12 a few times a week. She also takes a multivitamin with iron.  MEDICATIONS Current: Wegovy, vitamin D , sublingual B12, multivitamin with iron    Problems being monitored/treated and associated ROS:  The following portions of the patient's history were reviewed and updated as appropriate: current medications and problem list.   Vitals BP: 130/87 Heart Rate: 78 SpO2: 97 % Height: 172.7 cm (5' 8) Weight: 93.4 kg (206 lb)   Wt Readings from Last 4 Encounters:  06/25/23 93.4 kg (206 lb)  04/22/23 97.1 kg (214 lb)  03/19/23 98 kg (216 lb)  08/27/22 101 kg (223 lb)     Exam General: NAD Head: NCAT Eyes: EOMI, PERRL Neck: Supple, trachea mid-line Ext: No cyanosis, clubbing, edema Psych: Alert, oriented; normal behavior, judgement  Plan  1. History of sleeve gastrectomy      2. Vitamin D  deficiency      3. Low vitamin B12 level      4. Dietary counseling and surveillance      5. BMI 31.0-31.9,adult          New Medications Ordered This Visit  Medications  . semaglutide, weight loss, (Wegovy) 1.7 mg/0.75 mL subcutaneous pen injector    Sig: Inject 0.75 mL (1.7 mg total) under the skin every 7 days.    Dispense:  3 mL    Refill:  2    Medications Discontinued During This Encounter  Medication Reason  . semaglutide, weight loss, (Wegovy) 1 mg/0.5 mL subcutaneous pen injector      Assessment & Plan 1. Weight management. She has experienced a weight loss of 10 pounds since starting Wegovy, with no reported side effects such as nausea, reflux, belching, or constipation. Her BMI has decreased to 31. She is advised to continue her current dietary habits, including protein shakes and  small, frequent eating episodes with focus on lean protein intake. She is also encouraged to maintain her vitamin regimen, including weekly vitamin D  and sublingual B12 supplements. The dosage of Wegovy will be increased to 1.7 mg, with refills provided. If she experiences increased hunger towards the end of the week, the dosage may be further increased to 2.4 mg. She is instructed to discontinue Wegovy two weeks prior to any scheduled  surgery and resume it post-surgery.  2. Vitamin D  deficiency. Her vitamin D  levels were low but not critically so. She is currently taking 50,000 IU of vitamin D  once a week. Her vitamin D  levels will be rechecked at the next visit.  3. Vitamin B12 deficiency. Her B12 levels were low normal at 221 mcg/mL. She is advised to take sublingual B12 supplements (1000 mcg) a few times a week. Her B12 levels will be rechecked at the next visit.  4. Iron deficiency. Her ferritin levels have improved significantly from 8 to 35. She is currently taking a multivitamin with iron.  Follow-up The patient will follow up in 3 months.     I have personally spent 30 minutes involved in face-to-face and non-face-to-face activities for this patient on the day of the visit.  Professional time spent includes the following activities, in addition to those noted in the documentation: preparing to see the patient by review of history and previous tests; counseling and educating the patient; ordering medications, tests, or procedures; referring andcommunicating with other health care professionals; and documenting clinical information in the health record.

## 2023-07-15 ENCOUNTER — Ambulatory Visit: Payer: MEDICAID

## 2023-07-15 ENCOUNTER — Other Ambulatory Visit (HOSPITAL_COMMUNITY)
Admission: RE | Admit: 2023-07-15 | Discharge: 2023-07-15 | Disposition: A | Discharge status: Home / Self Care | Payer: MEDICAID | Source: Ambulatory Visit | Attending: Obstetrics and Gynecology | Admitting: Obstetrics and Gynecology

## 2023-07-15 VITALS — BP 136/85 | HR 80

## 2023-07-15 DIAGNOSIS — N760 Acute vaginitis: Secondary | ICD-10-CM

## 2023-07-15 DIAGNOSIS — B9689 Other specified bacterial agents as the cause of diseases classified elsewhere: Secondary | ICD-10-CM | POA: Insufficient documentation

## 2023-07-15 NOTE — Progress Notes (Signed)
 SUBJECTIVE:  38 y.o. female who desires a STI screen.  Reports vaginal discharge for ~1 week. Denies abnormal vaginal bleeding or significant pelvic pain. No UTI symptoms. Denies history of known exposure to STD.  No LMP recorded.  OBJECTIVE:  She appears well.   ASSESSMENT:  STI Screen   PLAN:  Pt offered STI blood screening-requested GC, chlamydia, and trichomonas probe sent to lab.  Treatment: To be determined once lab results are received.  Pt follow up as needed.

## 2023-07-16 LAB — RPR: RPR Ser Ql: NONREACTIVE

## 2023-07-16 LAB — HEPATITIS C ANTIBODY: Hep C Virus Ab: NONREACTIVE

## 2023-07-16 LAB — HEPATITIS B SURFACE ANTIGEN: Hepatitis B Surface Ag: NEGATIVE

## 2023-07-16 LAB — HIV ANTIBODY (ROUTINE TESTING W REFLEX): HIV Screen 4th Generation wRfx: NONREACTIVE

## 2023-07-17 LAB — CERVICOVAGINAL ANCILLARY ONLY
Bacterial Vaginitis (gardnerella): POSITIVE — AB
Candida Glabrata: NEGATIVE
Candida Vaginitis: NEGATIVE
Chlamydia: NEGATIVE
Comment: NEGATIVE
Comment: NEGATIVE
Comment: NEGATIVE
Comment: NEGATIVE
Comment: NEGATIVE
Comment: NORMAL
Neisseria Gonorrhea: NEGATIVE
Trichomonas: NEGATIVE

## 2023-07-18 ENCOUNTER — Ambulatory Visit: Payer: Self-pay | Admitting: Obstetrics and Gynecology

## 2023-07-18 MED ORDER — NUVESSA 1.3 % VA GEL: 1.0000 | Freq: Every evening | VAGINAL | 0 refills | Status: AC

## 2023-08-01 ENCOUNTER — Ambulatory Visit: Payer: MEDICAID

## 2023-08-01 ENCOUNTER — Other Ambulatory Visit (HOSPITAL_COMMUNITY)
Admission: RE | Admit: 2023-08-01 | Discharge: 2023-08-01 | Disposition: A | Discharge status: Home / Self Care | Payer: MEDICAID | Source: Ambulatory Visit | Attending: Obstetrics | Admitting: Obstetrics

## 2023-08-01 VITALS — BP 131/82 | HR 79

## 2023-08-01 DIAGNOSIS — N898 Other specified noninflammatory disorders of vagina: Secondary | ICD-10-CM

## 2023-08-01 NOTE — Progress Notes (Signed)
 SUBJECTIVE:  38 y.o. female complains of white vaginal discharge for since was last in office on 6/9. Reports having BV but unsure if that is resolved, or if it has and now has a yeast infection. Denies abnormal vaginal bleeding or significant pelvic pain or fever. No UTI symptoms. Denies history of known exposure to STD.  Patient's last menstrual period was 07/16/2023 (exact date).  OBJECTIVE:  She appears well, afebrile. Urine dipstick: not done.  ASSESSMENT:  Vaginal Discharge  Vaginal Odor   PLAN:  GC, chlamydia, trichomonas, BVAG, CVAG probe sent to lab. Treatment: To be determined once lab results are received ROV prn if symptoms persist or worsen.

## 2023-08-02 ENCOUNTER — Ambulatory Visit: Payer: Self-pay | Admitting: Obstetrics and Gynecology

## 2023-08-02 LAB — CERVICOVAGINAL ANCILLARY ONLY
Bacterial Vaginitis (gardnerella): POSITIVE — AB
Candida Glabrata: NEGATIVE
Candida Vaginitis: POSITIVE — AB
Chlamydia: NEGATIVE
Comment: NEGATIVE
Comment: NEGATIVE
Comment: NEGATIVE
Comment: NEGATIVE
Comment: NEGATIVE
Comment: NORMAL
Neisseria Gonorrhea: NEGATIVE
Trichomonas: NEGATIVE

## 2023-08-02 MED ORDER — FLUCONAZOLE 150 MG PO TABS: 150.0000 mg | ORAL_TABLET | Freq: Once | ORAL | 0 refills | Status: AC

## 2023-08-02 MED ORDER — METRONIDAZOLE 0.75 % VA GEL: 1.0000 | Freq: Every day | VAGINAL | 1 refills | Status: DC

## 2023-08-05 ENCOUNTER — Ambulatory Visit: Payer: MEDICAID | Admitting: Family Medicine

## 2023-08-15 NOTE — Progress Notes (Signed)
 ICD-10-CM   1. Pre-op testing  Z01.818 CBC with Differential    Basic Metabolic Panel       History of Present Illness: Danielle [name] is a 38 y.o.  female  with a history of abdominal pannus s/p gastric sleeve.  She presents for preoperative evaluation for upcoming procedure, panniculectomy with transfer/rearrangement of adjacent tissue abdomen , scheduled for 08/27/23 with Dr. Alm. Denies recent illnesses or AC. Last dose of wegovy 1 week ago. Not a smoker or diabetic. Takes iron supplement for hx of IDA.   Problem list: Problem List[1]  Past Medical History: Allergies: Allergies[2]  Current Medications: Current Medications[3]  Past Medical Problems: Medical History[4]  Past Surgical History: Surgical History[5]  Social History: Social History   Socioeconomic History  . Marital status: Divorced    Spouse name: Not on file  . Number of children: Not on file  . Years of education: Not on file  . Highest education level: Not on file  Occupational History  . Not on file  Tobacco Use  . Smoking status: Never  . Smokeless tobacco: Never  Substance and Sexual Activity  . Alcohol use: Not Currently    Alcohol/week: 3.0 standard drinks of alcohol  . Drug use: Never  . Sexual activity: Not on file  Other Topics Concern  . Not on file  Social History Narrative  . Not on file   Social Drivers of Health   Food Insecurity: No Food Insecurity (07/04/2021)   Received from [facility] visits prior to 04/07/2022.   Food vital sign   . Within the past 12 months, you worried that your food would run out before you got money to buy more: Never true   . Within the past 12 months, the food you bought just didn't last and you didn't have money to get more: Never true  Transportation Needs: No Transportation Needs (09/10/2018)   Received from [facility] - Transportation   . Lack of Transportation (Medical): No   . Lack of Transportation  (Non-Medical): No  Safety: Not At Risk (09/10/2018)   Received from [facility]   Safety   . Within the last year, have you been afraid of your partner or ex-partner?: No   . Within the last year, have you been humiliated or emotionally abused in other ways by your partner or ex-partner?: No   . Within the last year, have you been kicked, hit, slapped, or otherwise physically hurt by your partner or ex-partner?: No   . Within the last year, have you been raped or forced to have any kind of sexual activity by your partner or ex-partner?: No  Living Situation: Not on file    Family History: Family History[6]  Review of Systems: General ROS: negative for - chills, fatigue, or fever Dermatological ROS: negative Cardiovascular ROS: no chest pain or dyspnea on exertion ENT ROS: negative Gastrointestinal ROS: no abdominal pain, change in bowel habits, or black or bloody stools  Physical Exam: Vital Signs BP 133/79   Pulse 72   Temp 97.5 F (36.4 C)   Ht 1.727 m (5' 8)   Wt 89.4 kg (197 lb)   BMI 29.95 kg/m  General:alert, appears stated age, and cooperative HEENT:negative Neck: normal Chest: Normal chest wall and respirations. Clear to auscultation. Cardiac: Regular rate and rhythm Abdomen: soft, non-tender; bowel sounds normal; no masses,  no organomegaly Musculoskeletal: Normal. Back is straight and non-tender, full ROM of upper and lower  extremities. Neuro: oriented, normal mood Extremities: normal strength, tone, and muscle mass Skin: no rashes Assessment: 38 y.o.female  with a history of abdominal pannus s/p gastric sleeve  Plan: Panniculectomy with transfer/rearrangement of adjacent tissue abdomen scheduled for 08/27/23 with Dr. Alm.   -hold wegovy until after surgery -labs updated today  Risks, benefits, and alternatives of procedure discussed, questions answered and consent obtained.   Anesthesia risks Bleeding Infection Fluid accumulation (seroma) Poor wound  healing Skin loss (loss of navel) Numbness or other changes in skin sensation Skin discoloration and/or prolonged swelling Unfavorable scarring Recurrent looseness of skin Fatty tissue found deep in the skin might die (fat necrosis) Deep vein thrombosis, cardiac and pulmonary complications Asymmetry Suboptimal aesthetic result Possibility of revisional surgery Persistent pain   Electronically signed by: Geofm Estefana Ates, FNP 08/15/2023 4:27 PM               [1] Patient Active Problem List Diagnosis  . Morbid obesity (HCC)  . S/P laparoscopic cholecystectomy  . Essential hypertension  . Iron deficiency anemia due to chronic blood loss  . Morbid obesity with BMI of 45.0-49.9, adult (HCC)  . Abdominal pannus  [2] Allergies Allergen Reactions  . Latex Hives, Itching and Rash  [3]  Current Outpatient Medications:  .  Abilify  Maintena 400 mg sers syringe, Inject 400 mg into the muscle every 30 (thirty) days., Disp: , Rfl:  .  calcium carbonate (TUMS) 500 mg (200 mg calcium) chewable tablet, Chew 1 tablet daily., Disp: , Rfl:  .  clonazePAM (KlonoPIN) 0.5 mg tablet, Take 0.5 mg by mouth daily as needed for anxiety., Disp: , Rfl:  .  cyanocobalamin (VITAMIN B12) 1,000 mcg tablet, Take 1,000 mcg by mouth daily., Disp: , Rfl:  .  dicyclomine (BENTYL) 10 mg capsule, Take 10 mg by mouth 3 (three) times a day as needed., Disp: 20 capsule, Rfl: 0 .  ergocalciferol  (VITAMIN D2) 1,250 mcg (50,000 unit) capsule, Take 1 capsule (50,000 Units total) by mouth once a week., Disp: 12 capsule, Rfl: 3 .  ferrous sulfate  325 mg (65 mg iron) EC tablet, Take 1 tablet (325 mg total) by mouth daily with breakfast., Disp: 90 tablet, Rfl: 1 .  multivit,min no.21/folic acid (MULTIVIT,MINS NO.21-FOLIC ACID ORAL), Take by mouth. Bariatric, Disp: , Rfl:  .  semaglutide, weight loss, (Wegovy) 1.7 mg/0.75 mL subcutaneous pen injector, Inject 0.75 mL (1.7 mg total) under the skin every 7 days.  (Patient not taking: Reported on 08/15/2023), Disp: 3 mL, Rfl: 2 [4] Past Medical History: Diagnosis Date  . Hypertension   . Obesity   [5] Past Surgical History: Procedure Laterality Date  . CHOLECYSTECTOMY  04/2018   Procedure: CHOLECYSTECTOMY  . ENDOMETRIAL ABLATION  11/2019   Procedure: ENDOMETRIAL ABLATION  . SLEEVE GASTROPLASTY N/A 07/04/2021   Procedure: SLEEVE GASTRECTOMY LAPAROSCOPIC;  Surgeon: Laurel Cheral Monte, MD;  Location: [facility] MAIN OR;  Service: General;  Laterality: N/A;  . TOOTH EXTRACTION     Procedure: TOOTH EXTRACTION  . TUBAL LIGATION Bilateral 11/2018   Procedure: TUBAL LIGATION  [6] Family History Problem Relation Name Age of Onset  . Diabetes Father    . Hypertension Father    . Anesthesia problems Neg Hx

## 2023-08-21 ENCOUNTER — Encounter: Payer: Self-pay | Admitting: Obstetrics and Gynecology

## 2023-08-21 ENCOUNTER — Other Ambulatory Visit (HOSPITAL_COMMUNITY)
Admission: RE | Admit: 2023-08-21 | Discharge: 2023-08-21 | Disposition: A | Discharge status: Home / Self Care | Payer: MEDICAID | Source: Ambulatory Visit | Attending: Physician Assistant | Admitting: Physician Assistant

## 2023-08-21 ENCOUNTER — Ambulatory Visit: Payer: MEDICAID | Admitting: Obstetrics and Gynecology

## 2023-08-21 VITALS — BP 135/89 | HR 105 | Ht 68.0 in | Wt 202.0 lb

## 2023-08-21 DIAGNOSIS — Z01419 Encounter for gynecological examination (general) (routine) without abnormal findings: Secondary | ICD-10-CM

## 2023-08-21 DIAGNOSIS — N760 Acute vaginitis: Secondary | ICD-10-CM

## 2023-08-21 DIAGNOSIS — B9689 Other specified bacterial agents as the cause of diseases classified elsewhere: Secondary | ICD-10-CM

## 2023-08-21 DIAGNOSIS — N939 Abnormal uterine and vaginal bleeding, unspecified: Secondary | ICD-10-CM | POA: Diagnosis not present

## 2023-08-21 MED ORDER — CLINDAMYCIN PHOSPHATE 2 % VA CREA: 1.0000 | TOPICAL_CREAM | Freq: Every day | VAGINAL | 0 refills | Status: DC

## 2023-08-21 MED ORDER — METRONIDAZOLE 500 MG PO TABS: 500.0000 mg | ORAL_TABLET | Freq: Two times a day (BID) | ORAL | 1 refills | Status: DC

## 2023-08-21 MED ORDER — FLUCONAZOLE 150 MG PO TABS: 150.0000 mg | ORAL_TABLET | ORAL | 0 refills | Status: AC

## 2023-08-21 MED ORDER — NEXTSTELLIS 3-14.2 MG PO TABS: 1.0000 | ORAL_TABLET | Freq: Every day | ORAL | 3 refills | Status: DC

## 2023-08-21 NOTE — Patient Instructions (Addendum)
 I've sent in a new OCP called Nextstellis  which has an even more potent estrogen than the seasonique. Hopefully this will be helpful for you. It is sometimes not covered by insurance and requires a prior authorization. Let me know if you have any problems getting ahold of the medication.

## 2023-08-21 NOTE — Progress Notes (Signed)
 ANNUAL EXAM Patient name: Danielle [name] [name] MRN [id number]  Date of birth: 01-21-1986 Chief Complaint:   Dysmenorrhea  History of Present Illness:   Danielle [name] [name] is a 38 y.o. [phone] African-American female being seen today for a routine annual exam.  Current complaints:  - Ongoing dysmenorrhea. Has a hx of ablation several years ago and BTL for contraception. Initially after ablation, periods were much better but has worsened since. Has a period for 7+ days per month with soaking pads and clots. Even if she's not bleeding heavily, will have cramping symptoms. On Seasonique which is not very helpful. Pain with intercourse intermittently. - Recurrent BV. Partner has not been treated. - Bump on mons for past few weeks. Resolving.  Patient's last menstrual period was 07/16/2023 (exact date).  Last pap 06/27/22. Results were: NILM. No HPV testing. H/O abnormal pap: no     03/28/2022    2:12 PM 08/17/2016   11:38 AM  Depression screen PHQ 2/9  Decreased Interest 0 0  Down, Depressed, Hopeless 0 0  PHQ - 2 Score 0 0  Altered sleeping 0 0  Tired, decreased energy 0 1  Change in appetite 0 1  Feeling bad or failure about yourself  0 0  Trouble concentrating 0 0  Moving slowly or fidgety/restless 0 0  Suicidal thoughts 0 0  PHQ-9 Score 0 2        03/28/2022    2:12 PM 08/17/2016   11:38 AM  GAD 7 : Generalized Anxiety Score  Nervous, Anxious, on Edge 0 1  Control/stop worrying 0 1  Worry too much - different things 0 2  Trouble relaxing 0 1  Restless 0 0  Easily annoyed or irritable 0 0  Afraid - awful might happen 0 1  Total GAD 7 Score 0 6     Review of Systems:   Pertinent items are noted in HPI Denies any headaches, blurred vision, fatigue, shortness of breath, chest pain, abdominal pain, abnormal vaginal discharge/itching/odor/irritation, problems with periods, bowel movements, urination, or intercourse unless otherwise stated above. Pertinent History Reviewed:  Reviewed  past medical,surgical, social and family history.  Reviewed problem list, medications and allergies. Physical Assessment:   Vitals:   08/21/23 1548 08/21/23 1558  BP: (!) 158/88 135/89  Pulse: (!) 105   Weight: 202 lb (91.6 kg)   Height: 5' 8 (1.727 m)   Body mass index is 30.71 kg/m.        Physical Examination:   General appearance - well appearing, and in no distress  Mental status - alert, oriented to person, place, and time  Psych:  She has a normal mood and affect  Skin - warm and dry, normal color, no suspicious lesions noted  Chest - effort normal  Heart - normal rate  Neck:  midline trachea, no thyromegaly or nodules  Abdomen - soft, nontender, nondistended, no masses or organomegaly  Pelvic - VULVA: normal appearing vulva with no masses, tenderness or lesions. Ingrown hair present L mons VAGINA: normal appearing vagina with normal color and discharge, no lesions  CERVIX: normal appearing cervix without lesions, thin and clumpy discharge, no CMT  Thin prep pap is done with HR HPV cotesting  UTERUS: uterus is felt to be normal size, shape, consistency and nontender   ADNEXA: No adnexal masses or tenderness noted.  Extremities:  No swelling or varicosities noted  Chaperone present for exam  No results found for this or any previous visit (from the past 24 hours).  Assessment & Plan:   1. Abnormal uterine bleeding (AUB) S/p ablation and currently on Seasonique. Last TVUS 10/23. Do have concern for anemia, structural abnormality, possible endometrial hyperplasia given heaviness of bleeding. IUD likely not to be helpful given prior ablation.  - Will obtain CBC, pelvic ultrasound for further evaluation - Will trial nexstellis - Schedule next appointment with Dr. Jeralyn. Would consider an [place], hysterectomy at this time  - CBC - Drospirenone -Estetrol (NEXTSTELLIS ) 3-14.2 MG TABS; Take 1 tablet by mouth daily.  Dispense: 90 tablet; Refill: 3 - TVUS  2. Recurrent  BV Multiple infections in past year with documented swabs. Suspect current infection based upon exam at this time. Will treat patient and partner. Prophylaxis against yeast given antibiotic.  - Cervicovaginal ancillary only( [place]) - metroNIDAZOLE  (FLAGYL ) 500 MG tablet; Take 1 tablet (500 mg total) by mouth 2 (two) times daily.  Dispense: 14 tablet; Refill: 1 - clindamycin  (CLEOCIN ) 2 % vaginal cream; Place 1 Applicatorful vaginally at bedtime.  Dispense: 40 g; Refill: 0 - fluconazole  (DIFLUCAN ) 150 MG tablet; Take 1 tablet (150 mg total) by mouth every 3 (three) days for 2 doses. Repeat dose in 3 days if symptoms have not resolved  Dispense: 2 tablet; Refill: 0  3. Well woman exam with routine gynecological exam (Primary) Last pap without HPV co-testing. Repeated today along with STI screening. - Cytology - PAP - HIV antibody (with reflex) - RPR - Hepatitis C Antibody - Hepatitis B Surface AntiGEN  Mammogram: @ 40yo, or sooner if problems Colonoscopy: @ 39yo, or sooner if problems  Orders Placed This Encounter  Procedures   US  PELVIC COMPLETE WITH TRANSVAGINAL   CBC   HIV antibody (with reflex)   RPR   Hepatitis C Antibody   Hepatitis B Surface AntiGEN    Meds:  Meds ordered this encounter  Medications   metroNIDAZOLE  (FLAGYL ) 500 MG tablet    Sig: Take 1 tablet (500 mg total) by mouth 2 (two) times daily.    Dispense:  14 tablet    Refill:  1   clindamycin  (CLEOCIN ) 2 % vaginal cream    Sig: Place 1 Applicatorful vaginally at bedtime.    Dispense:  40 g    Refill:  0   Drospirenone -Estetrol (NEXTSTELLIS ) 3-14.2 MG TABS    Sig: Take 1 tablet by mouth daily.    Dispense:  90 tablet    Refill:  3   fluconazole  (DIFLUCAN ) 150 MG tablet    Sig: Take 1 tablet (150 mg total) by mouth every 3 (three) days for 2 doses. Repeat dose in 3 days if symptoms have not resolved    Dispense:  2 tablet    Refill:  0    Follow-up: Return in about 3 months (around 11/21/2023)  for AUB/Dr. Jeralyn.  [name], MD 08/21/2023 5:06 PM

## 2023-08-21 NOTE — Progress Notes (Signed)
 Recurrent BV. Dysmenorrhea for several weeks. Pain with intercourse. Bleeding irregular, with clots. Bump noted 3 weeks left vulvar area.

## 2023-08-22 LAB — CERVICOVAGINAL ANCILLARY ONLY
Bacterial Vaginitis (gardnerella): POSITIVE — AB
Chlamydia: NEGATIVE
Comment: NEGATIVE
Comment: NEGATIVE
Comment: NEGATIVE
Comment: NORMAL
Neisseria Gonorrhea: NEGATIVE
Trichomonas: NEGATIVE

## 2023-08-22 LAB — HIV ANTIBODY (ROUTINE TESTING W REFLEX): HIV Screen 4th Generation wRfx: NONREACTIVE

## 2023-08-22 LAB — CBC
Hematocrit: 41 % (ref 34.0–46.6)
Hemoglobin: 12 g/dL (ref 11.1–15.9)
MCH: 22.9 pg — ABNORMAL LOW (ref 26.6–33.0)
MCHC: 29.3 g/dL — ABNORMAL LOW (ref 31.5–35.7)
MCV: 78 fL — ABNORMAL LOW (ref 79–97)
Platelets: 346 x10E3/uL (ref 150–450)
RBC: 5.24 x10E6/uL (ref 3.77–5.28)
RDW: 14.4 % (ref 11.7–15.4)
WBC: 7.2 x10E3/uL (ref 3.4–10.8)

## 2023-08-22 LAB — HEPATITIS C ANTIBODY: Hep C Virus Ab: NONREACTIVE

## 2023-08-22 LAB — RPR: RPR Ser Ql: NONREACTIVE

## 2023-08-22 LAB — HEPATITIS B SURFACE ANTIGEN: Hepatitis B Surface Ag: NEGATIVE

## 2023-08-24 ENCOUNTER — Ambulatory Visit: Payer: Self-pay | Admitting: Obstetrics and Gynecology

## 2023-08-27 LAB — CYTOLOGY - PAP
Comment: NEGATIVE
Diagnosis: NEGATIVE
High risk HPV: NEGATIVE

## 2023-08-30 ENCOUNTER — Ambulatory Visit (HOSPITAL_COMMUNITY): Payer: MEDICAID | Attending: Obstetrics and Gynecology

## 2023-10-28 ENCOUNTER — Other Ambulatory Visit (HOSPITAL_COMMUNITY)
Admission: RE | Admit: 2023-10-28 | Discharge: 2023-10-28 | Disposition: A | Discharge status: Home / Self Care | Payer: MEDICAID | Source: Ambulatory Visit | Attending: Obstetrics and Gynecology | Admitting: Obstetrics and Gynecology

## 2023-10-28 ENCOUNTER — Ambulatory Visit (INDEPENDENT_AMBULATORY_CARE_PROVIDER_SITE_OTHER): Payer: MEDICAID

## 2023-10-28 VITALS — BP 133/87 | HR 94 | Ht 67.0 in | Wt 187.0 lb

## 2023-10-28 DIAGNOSIS — Z3202 Encounter for pregnancy test, result negative: Secondary | ICD-10-CM | POA: Diagnosis not present

## 2023-10-28 DIAGNOSIS — R300 Dysuria: Secondary | ICD-10-CM | POA: Diagnosis not present

## 2023-10-28 DIAGNOSIS — N898 Other specified noninflammatory disorders of vagina: Secondary | ICD-10-CM | POA: Diagnosis present

## 2023-10-28 DIAGNOSIS — N926 Irregular menstruation, unspecified: Secondary | ICD-10-CM

## 2023-10-28 LAB — POCT URINE PREGNANCY: Preg Test, Ur: NEGATIVE

## 2023-10-28 LAB — POCT URINALYSIS DIPSTICK
Bilirubin, UA: NEGATIVE
Blood, UA: NEGATIVE
Glucose, UA: NEGATIVE
Ketones, UA: NEGATIVE
Leukocytes, UA: NEGATIVE
Nitrite, UA: NEGATIVE
Protein, UA: POSITIVE — AB
Spec Grav, UA: 1.025 (ref 1.010–1.025)
Urobilinogen, UA: 0.2 U/dL
pH, UA: 6 (ref 5.0–8.0)

## 2023-10-28 NOTE — Progress Notes (Signed)
 SUBJECTIVE:  38 y.o. female complains of Danielle [name] and thick vaginal discharge for 1 week(s). Denies abnormal vaginal bleeding or significant pelvic pain or fever. Reports dysuria. Denies history of known exposure to STD. Reports a bump on labia that she noticed last night.  Patient's last menstrual period was 07/16/2023 (exact date). Pt has had a tubal, but no period in ~ 80 days.  OBJECTIVE:  She appears well, afebrile. Urine dipstick: positive for protein.  ASSESSMENT:  Vaginal Discharge  Dysuria   PLAN:  GC, chlamydia, trichomonas, BVAG, CVAG probe sent to lab. Urine culture sent to lab. UPT performed today. Treatment: To be determined once lab results are received ROV prn if symptoms persist or worsen. Advised for patient to schedule a provider visit for further assessment of bump on labia.

## 2023-10-29 ENCOUNTER — Ambulatory Visit: Payer: Self-pay | Admitting: Obstetrics and Gynecology

## 2023-10-29 LAB — HEPATITIS C ANTIBODY: Hep C Virus Ab: NONREACTIVE

## 2023-10-29 LAB — CERVICOVAGINAL ANCILLARY ONLY
Bacterial Vaginitis (gardnerella): POSITIVE — AB
Candida Glabrata: NEGATIVE
Candida Vaginitis: NEGATIVE
Chlamydia: NEGATIVE
Comment: NEGATIVE
Comment: NEGATIVE
Comment: NEGATIVE
Comment: NEGATIVE
Comment: NEGATIVE
Comment: NORMAL
Neisseria Gonorrhea: NEGATIVE
Trichomonas: NEGATIVE

## 2023-10-29 LAB — RPR: RPR Ser Ql: NONREACTIVE

## 2023-10-29 LAB — HIV ANTIBODY (ROUTINE TESTING W REFLEX): HIV Screen 4th Generation wRfx: NONREACTIVE

## 2023-10-29 LAB — HEPATITIS B SURFACE ANTIGEN: Hepatitis B Surface Ag: NEGATIVE

## 2023-10-29 MED ORDER — METRONIDAZOLE 500 MG PO TABS: 500.0000 mg | ORAL_TABLET | Freq: Two times a day (BID) | ORAL | 0 refills | Status: DC

## 2023-10-30 LAB — URINE CULTURE: Organism ID, Bacteria: NO GROWTH

## 2023-11-07 ENCOUNTER — Ambulatory Visit: Payer: MEDICAID | Admitting: Student

## 2023-11-22 ENCOUNTER — Other Ambulatory Visit: Payer: Self-pay

## 2023-11-22 ENCOUNTER — Ambulatory Visit (HOSPITAL_COMMUNITY)
Admission: EM | Admit: 2023-11-22 | Discharge: 2023-11-22 | Disposition: A | Discharge status: Home / Self Care | Payer: MEDICAID | Attending: Emergency Medicine | Admitting: Emergency Medicine

## 2023-11-22 ENCOUNTER — Encounter (HOSPITAL_COMMUNITY): Payer: Self-pay

## 2023-11-22 ENCOUNTER — Emergency Department (HOSPITAL_BASED_OUTPATIENT_CLINIC_OR_DEPARTMENT_OTHER)
Admission: EM | Admit: 2023-11-22 | Discharge: 2023-11-22 | Discharge status: Against Medical Advice | Payer: MEDICAID | Source: Ambulatory Visit | Attending: Emergency Medicine | Admitting: Emergency Medicine

## 2023-11-22 DIAGNOSIS — R1030 Lower abdominal pain, unspecified: Secondary | ICD-10-CM | POA: Diagnosis present

## 2023-11-22 DIAGNOSIS — Z3202 Encounter for pregnancy test, result negative: Secondary | ICD-10-CM | POA: Diagnosis not present

## 2023-11-22 DIAGNOSIS — N946 Dysmenorrhea, unspecified: Secondary | ICD-10-CM

## 2023-11-22 DIAGNOSIS — Z91040 Latex allergy status: Secondary | ICD-10-CM | POA: Diagnosis not present

## 2023-11-22 DIAGNOSIS — N939 Abnormal uterine and vaginal bleeding, unspecified: Secondary | ICD-10-CM | POA: Diagnosis not present

## 2023-11-22 DIAGNOSIS — R109 Unspecified abdominal pain: Secondary | ICD-10-CM | POA: Diagnosis not present

## 2023-11-22 LAB — POCT URINALYSIS DIP (MANUAL ENTRY)
Bilirubin, UA: NEGATIVE
Glucose, UA: NEGATIVE mg/dL
Ketones, POC UA: NEGATIVE mg/dL
Leukocytes, UA: NEGATIVE
Nitrite, UA: NEGATIVE
Protein Ur, POC: 30 mg/dL — AB
Spec Grav, UA: >=1.03 — AB (ref 1.010–1.025)
Urobilinogen, UA: 0.2 U/dL
pH, UA: 5.5 (ref 5.0–8.0)

## 2023-11-22 LAB — COMPREHENSIVE METABOLIC PANEL WITH GFR
ALT: 10 U/L (ref 0–44)
AST: 19 U/L (ref 15–41)
Albumin: 4.1 g/dL (ref 3.5–5.0)
Alkaline Phosphatase: 71 U/L (ref 38–126)
Anion gap: 11 (ref 5–15)
BUN: 9 mg/dL (ref 6–20)
CO2: 23 mmol/L (ref 22–32)
Calcium: 9.2 mg/dL (ref 8.9–10.3)
Chloride: 104 mmol/L (ref 98–111)
Creatinine, Ser: 0.52 mg/dL (ref 0.44–1.00)
GFR, Estimated: >60 mL/min (ref >60)
Glucose, Bld: 97 mg/dL (ref 70–99)
Potassium: 3.6 mmol/L (ref 3.5–5.1)
Sodium: 138 mmol/L (ref 135–145)
Total Bilirubin: 0.3 mg/dL (ref 0.0–1.2)
Total Protein: 7.3 g/dL (ref 6.5–8.1)

## 2023-11-22 LAB — CBC WITH DIFFERENTIAL/PLATELET
Abs Immature Granulocytes: 0.01 K/uL (ref 0.00–0.07)
Basophils Absolute: 0 K/uL (ref 0.0–0.1)
Basophils Relative: 1 %
Eosinophils Absolute: 0.1 K/uL (ref 0.0–0.5)
Eosinophils Relative: 1 %
HCT: 35.1 % — ABNORMAL LOW (ref 36.0–46.0)
Hemoglobin: 10.6 g/dL — ABNORMAL LOW (ref 12.0–15.0)
Immature Granulocytes: 0 %
Lymphocytes Relative: 41 %
Lymphs Abs: 2.1 K/uL (ref 0.7–4.0)
MCH: 20.9 pg — ABNORMAL LOW (ref 26.0–34.0)
MCHC: 30.2 g/dL (ref 30.0–36.0)
MCV: 69.4 fL — ABNORMAL LOW (ref 80.0–100.0)
Monocytes Absolute: 0.4 K/uL (ref 0.1–1.0)
Monocytes Relative: 8 %
Neutro Abs: 2.5 K/uL (ref 1.7–7.7)
Neutrophils Relative %: 49 %
Platelets: 191 K/uL (ref 150–400)
RBC: 5.06 MIL/uL (ref 3.87–5.11)
RDW: 18.6 % — ABNORMAL HIGH (ref 11.5–15.5)
WBC: 5 K/uL (ref 4.0–10.5)
nRBC: 0 % (ref 0.0–0.2)

## 2023-11-22 LAB — POCT URINE PREGNANCY: Preg Test, Ur: NEGATIVE

## 2023-11-22 MED ORDER — HYDROMORPHONE HCL 1 MG/ML IJ SOLN: 1.0000 mg | Freq: Once | INTRAMUSCULAR | Status: DC

## 2023-11-22 MED ORDER — ONDANSETRON HCL 4 MG/2ML IJ SOLN: 4.0000 mg | Freq: Once | INTRAMUSCULAR | Status: DC

## 2023-11-22 MED ORDER — KETOROLAC TROMETHAMINE 30 MG/ML IJ SOLN
30.0000 mg | Freq: Once | INTRAMUSCULAR | Status: AC
  Administered 2023-11-22: 30 mg via INTRAMUSCULAR

## 2023-11-22 MED ORDER — KETOROLAC TROMETHAMINE 30 MG/ML IJ SOLN
INTRAMUSCULAR | Status: AC
  Filled 2023-11-22: qty 1

## 2023-11-22 NOTE — ED Triage Notes (Signed)
 Patient presents to the office for lower abdominal pain and cramping. Patient states she is not soaking 1-2 pads per hour. Patient states she had an ablation x 3  years ago.

## 2023-11-22 NOTE — ED Provider Notes (Signed)
 Patient presents to the office for lower abdominal pain and cramping. Patient states she is soaking 1-2 pads per hour. Patient states she had an ablation x 3  years ago, states heavy menstrual cramping and heavy bleeding have slowly returned over time.  Patient was provided with an injection of ketorolac  for her pain and advised to go to the emergency room for further evaluation.   Joesph Shaver Scales, PA-C 11/22/23 1001

## 2023-11-22 NOTE — ED Provider Notes (Signed)
 [place] EMERGENCY DEPARTMENT AT [facility] Provider Note   CSN: [id number] Arrival date & time: 11/22/23  1329     Patient presents with: Vaginal Bleeding and Pelvic Pain   Danielle [name] is a 38 y.o. female.   Patient sent in from urgent care.  Was just screening there.  Was given a shot of Toradol .  Patient is got discomfort in the bilateral lower quadrants part of her abdomen said vaginal bleeding with clots since Wednesday.  Patient is followed by OB/GYN does have a history of ablation and tubal ligation.  OB/GYN was trying to see if they get her through the summer and then were considering maybe conversation about needing a hysterectomy come this fall.  Patient not able to see OB/GYN until October 4.  Patient denies any nausea or vomiting.  Denies any lightheadedness.  Patient did have a negative pregnancy test at urgent care and also did have a point-of-care urinalysis that was negative.  Temp here 98.8 pulse 67 respiration 16 blood pressure 143/79 oxygen saturation 100% on room air.       Prior to Admission medications   Medication Sig Start Date End Date Taking? Authorizing Provider  ARIPiprazole  ER (ABILIFY  MAINTENA) 400 MG PRSY prefilled syringe Inject 400 mg into the muscle.    [provider]  clindamycin  (CLEOCIN ) 2 % vaginal cream Place 1 Applicatorful vaginally at bedtime. 08/21/23   Nicholaus Almarie HERO, MD  Drospirenone -Estetrol (NEXTSTELLIS ) 3-14.2 MG TABS Take 1 tablet by mouth daily. 08/21/23   Nicholaus Almarie HERO, MD  metroNIDAZOLE  (FLAGYL ) 500 MG tablet Take 1 tablet (500 mg total) by mouth 2 (two) times daily. Patient not taking: No sig reported 08/21/23   Nicholaus Almarie HERO, MD  metroNIDAZOLE  (FLAGYL ) 500 MG tablet Take 1 tablet (500 mg total) by mouth 2 (two) times daily. 10/29/23   Constant, Peggy, MD  oxyCODONE  (OXY IR/ROXICODONE ) 5 MG immediate release tablet Take 1 tablet (5 mg total) by mouth every 6 (six) hours as needed for severe pain  (pain score 7-10) or breakthrough pain. 06/05/23   Zina Jerilynn LABOR, MD  valACYclovir  (VALTREX ) 500 MG tablet Take 1 tablet (500 mg total) by mouth 2 (two) times daily. 06/06/23   Zina Jerilynn LABOR, MD    Allergies: Latex    Review of Systems  Constitutional:  Negative for chills and fever.  HENT:  Negative for ear pain and sore throat.   Eyes:  Negative for pain and visual disturbance.  Respiratory:  Negative for cough and shortness of breath.   Cardiovascular:  Negative for chest pain and palpitations.  Gastrointestinal:  Positive for abdominal pain. Negative for vomiting.  Genitourinary:  Positive for pelvic pain and vaginal bleeding. Negative for dysuria and hematuria.  Musculoskeletal:  Negative for arthralgias and back pain.  Skin:  Negative for color change and rash.  Neurological:  Negative for seizures and syncope.  All other systems reviewed and are negative.   Updated Vital Signs BP 133/86   Pulse 70   Temp 98.1 F (36.7 C)   Resp 16   LMP 11/20/2023 (Exact Date)   SpO2 100%   Physical Exam Vitals and nursing note reviewed.  Constitutional:      General: She is not in acute distress.    Appearance: She is well-developed.  HENT:     Head: Normocephalic and atraumatic.  Eyes:     Conjunctiva/sclera: Conjunctivae normal.  Cardiovascular:     Rate and Rhythm: Normal rate and regular rhythm.  Heart sounds: No murmur heard. Pulmonary:     Effort: Pulmonary effort is normal. No respiratory distress.     Breath sounds: Normal breath sounds.  Abdominal:     Palpations: Abdomen is soft.     Tenderness: There is abdominal tenderness. There is no guarding.  Genitourinary:    Comments: Patient wanting to leave without any imaging.  Rest of exam not completed. Musculoskeletal:        General: No swelling.     Cervical back: Neck supple.  Skin:    General: Skin is warm and dry.     Capillary Refill: Capillary refill takes less than 2 seconds.  Neurological:      General: No focal deficit present.     Mental Status: She is alert and oriented to person, place, and time.     Cranial Nerves: No cranial nerve deficit.     Sensory: No sensory deficit.     Motor: No weakness.  Psychiatric:        Mood and Affect: Mood normal.     (all labs ordered are listed, but only abnormal results are displayed) Labs Reviewed  CBC WITH DIFFERENTIAL/PLATELET - Abnormal; Notable for the following components:      Result Value   Hemoglobin 10.6 (*)    HCT 35.1 (*)    MCV 69.4 (*)    MCH 20.9 (*)    RDW 18.6 (*)    All other components within normal limits  COMPREHENSIVE METABOLIC PANEL WITH GFR    EKG: None  Radiology: No results found.   Procedures   Medications Ordered in the ED  ondansetron  (ZOFRAN ) injection 4 mg (has no administration in time range)  HYDROmorphone  (DILAUDID ) injection 1 mg (has no administration in time range)                                    Medical Decision Making Amount and/or Complexity of Data Reviewed Labs: ordered. Radiology: ordered.  Risk Prescription drug management.   Recommending maybe CT scan abdomen and pelvis because of the bilateral abdominal pain.  Patient's vital signs here are reassuring.  Patient's hemoglobin here is 10.6.  Complete metabolic panel normal.  Negative pregnancy test at urgent care.  Just informed that patient needs to leave and will come back later this evening.  Did have conversation that may want to contact her OB/GYN as well.  Since they do know her and have been helping to treat the vaginal bleeding problem.  Final diagnoses:  Lower abdominal pain  Vaginal bleeding    ED Discharge Orders     None          Geraldene Hamilton, MD 11/22/23 1541

## 2023-11-22 NOTE — ED Triage Notes (Signed)
 Pt c/o something's going on w my female parts. Endorses vaginal bleeding/ clots since Wednesday, lower abd/ pelvic pain onset last night. States bleeding since Wednesday is like when I started my period. Hx ablation & tubal ligation, I'm not even supposed to be going through this.   Advises she's changed tampons approx 1 every - light tampons usually last all day for me.

## 2023-11-22 NOTE — ED Notes (Signed)
 Requesting to leave at this time and possibly return. Explained to Pt that she will be discharged and will need to check back in. Pt understands. Risks of leaving explained to Pt by this RN, not MD at this time. No AMA forms signed. Pt requesting pain medications before leaving to 'hold her over'. Discussed with pt and medication offered- pt is not receptive to this medication offered. Pt will leave with family at bedside. No PIV.

## 2023-12-10 ENCOUNTER — Ambulatory Visit: Payer: MEDICAID | Admitting: Obstetrics and Gynecology

## 2023-12-31 ENCOUNTER — Ambulatory Visit: Payer: MEDICAID

## 2024-01-03 ENCOUNTER — Other Ambulatory Visit: Payer: Self-pay | Admitting: Obstetrics and Gynecology

## 2024-01-03 DIAGNOSIS — B009 Herpesviral infection, unspecified: Secondary | ICD-10-CM

## 2024-01-13 ENCOUNTER — Emergency Department (HOSPITAL_BASED_OUTPATIENT_CLINIC_OR_DEPARTMENT_OTHER)
Admission: EM | Admit: 2024-01-13 | Discharge: 2024-01-13 | Disposition: A | Discharge status: Home / Self Care | Payer: MEDICAID | Attending: Emergency Medicine | Admitting: Emergency Medicine

## 2024-01-13 ENCOUNTER — Other Ambulatory Visit: Payer: Self-pay

## 2024-01-13 DIAGNOSIS — N946 Dysmenorrhea, unspecified: Secondary | ICD-10-CM

## 2024-01-13 LAB — COMPREHENSIVE METABOLIC PANEL WITH GFR
ALT: 14 U/L (ref 0–44)
AST: 22 U/L (ref 15–41)
Albumin: 4.2 g/dL (ref 3.5–5.0)
Alkaline Phosphatase: 88 U/L (ref 38–126)
Anion gap: 12 (ref 5–15)
BUN: 12 mg/dL (ref 6–20)
CO2: 23 mmol/L (ref 22–32)
Calcium: 9.9 mg/dL (ref 8.9–10.3)
Chloride: 106 mmol/L (ref 98–111)
Creatinine, Ser: 0.69 mg/dL (ref 0.44–1.00)
GFR, Estimated: >60 mL/min (ref >60)
Glucose, Bld: 106 mg/dL — ABNORMAL HIGH (ref 70–99)
Potassium: 3.7 mmol/L (ref 3.5–5.1)
Sodium: 141 mmol/L (ref 135–145)
Total Bilirubin: 0.4 mg/dL (ref 0.0–1.2)
Total Protein: 7.5 g/dL (ref 6.5–8.1)

## 2024-01-13 LAB — URINALYSIS, ROUTINE W REFLEX MICROSCOPIC
Bacteria, UA: NONE SEEN
Bilirubin Urine: NEGATIVE
Glucose, UA: NEGATIVE mg/dL
Hgb urine dipstick: NEGATIVE
Leukocytes,Ua: NEGATIVE
Nitrite: NEGATIVE
Protein, ur: 30 mg/dL — AB
Specific Gravity, Urine: >1.046 — ABNORMAL HIGH (ref 1.005–1.030)
pH: 6 (ref 5.0–8.0)

## 2024-01-13 LAB — CBC WITH DIFFERENTIAL/PLATELET
Abs Immature Granulocytes: 0.01 K/uL (ref 0.00–0.07)
Basophils Absolute: 0 K/uL (ref 0.0–0.1)
Basophils Relative: 0 %
Eosinophils Absolute: 0.1 K/uL (ref 0.0–0.5)
Eosinophils Relative: 1 %
HCT: 37 % (ref 36.0–46.0)
Hemoglobin: 11.5 g/dL — ABNORMAL LOW (ref 12.0–15.0)
Immature Granulocytes: 0 %
Lymphocytes Relative: 35 %
Lymphs Abs: 2.3 K/uL (ref 0.7–4.0)
MCH: 22 pg — ABNORMAL LOW (ref 26.0–34.0)
MCHC: 31.5 g/dL (ref 30.0–36.0)
MCV: 69.7 fL — ABNORMAL LOW (ref 80.0–100.0)
Monocytes Absolute: 0.5 K/uL (ref 0.1–1.0)
Monocytes Relative: 8 %
Neutro Abs: 3.6 K/uL (ref 1.7–7.7)
Neutrophils Relative %: 56 %
Platelets: 237 K/uL (ref 150–400)
RBC: 5.31 MIL/uL — ABNORMAL HIGH (ref 3.87–5.11)
RDW: 21.5 % — ABNORMAL HIGH (ref 11.5–15.5)
Smear Review: NORMAL
WBC: 6.9 K/uL (ref 4.0–10.5)
nRBC: 0 % (ref 0.0–0.2)

## 2024-01-13 LAB — PREGNANCY, URINE: Preg Test, Ur: NEGATIVE

## 2024-01-13 LAB — WET PREP, GENITAL
Clue Cells Wet Prep HPF POC: NONE SEEN
Sperm: NONE SEEN
Trich, Wet Prep: NONE SEEN
WBC, Wet Prep HPF POC: <10 (ref <10)
Yeast Wet Prep HPF POC: NONE SEEN

## 2024-01-13 LAB — LIPASE, BLOOD: Lipase: 25 U/L (ref 11–51)

## 2024-01-13 MED ORDER — KETOROLAC TROMETHAMINE 15 MG/ML IJ SOLN
30.0000 mg | Freq: Once | INTRAMUSCULAR | Status: AC
  Administered 2024-01-13: 30 mg via INTRAMUSCULAR
  Filled 2024-01-13: qty 2

## 2024-01-13 MED ORDER — OXYCODONE HCL 5 MG PO TABS
5.0000 mg | ORAL_TABLET | Freq: Once | ORAL | Status: AC
  Administered 2024-01-13: 5 mg via ORAL
  Filled 2024-01-13: qty 1

## 2024-01-13 MED ORDER — ACETAMINOPHEN 500 MG PO TABS
1000.0000 mg | ORAL_TABLET | Freq: Once | ORAL | Status: AC
  Administered 2024-01-13: 1000 mg via ORAL
  Filled 2024-01-13: qty 2

## 2024-01-13 NOTE — ED Triage Notes (Signed)
 Pt caox4 ambulatory c/o abdominal cramping and vaginal bleeding since yest, similar episode in October. Pt states she is not supposed to have a menstrual cycle because she had an ablation.

## 2024-01-13 NOTE — ED Notes (Signed)
 ED Provider at bedside.

## 2024-01-13 NOTE — Discharge Instructions (Addendum)
 Please continue treatment of your menstrual cramps at home.  You may use heating packs as needed to up with pain.  You may take up to 1000mg  of tylenol  every 6 hours as needed for pain.  Do not take more then 4g per day.  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  This tends to be the most beneficial medication for menstrual cramps.  Your labs are reassuring.  No abnormalities in your kidney, liver, pancreas, or gallbladder labs.  Your urine did not show any signs of infection, although does show you are slightly dehydrated.  Please increase your water intake at home today.  Urine a light yellow color.  You tested negative for yeast, bacterial vaginosis, and trichomoniasis.  Your gonorrhea and Chlamydia test is still in process.  If this is positive, you will be contacted to undergo further treatment.  You will also be able to see the results on your MyChart portal.  Please return to the ER for concerning worsening of your abdominal pain, any persistent vomiting, unexplained fevers, any other new or concerning symptoms

## 2024-01-13 NOTE — ED Provider Notes (Signed)
 [place] EMERGENCY DEPARTMENT AT [facility] Provider Note   CSN: [id number] Arrival date & time: 01/13/24  1500     Patient presents with: Abdominal Cramping   Danielle [name] is a 38 y.o. female with history of endometriosis and previous ablation and bilateral tubal ligation, presents with concern for heavy menstrual cycle and abdominal cramping that started yesterday.  Reports that she is going through about 1 light tampon every 3 hours.  Reports some passage of clots yesterday, but none today.  Reports lower abdominal cramping similar to previous cramping associated with her menstrual cycles.  She states she had improved cycles after her ablation, but her periods have been getting heavier recently.  She denies any nausea, vomiting, or diarrhea.  No fever or chills.  Her last menstrual period was at the beginning of November 2025.    Abdominal Cramping       Prior to Admission medications   Medication Sig Start Date End Date Taking? Authorizing Provider  ARIPiprazole  ER (ABILIFY  MAINTENA) 400 MG PRSY prefilled syringe Inject 400 mg into the muscle.    [provider]  clindamycin  (CLEOCIN ) 2 % vaginal cream Place 1 Applicatorful vaginally at bedtime. 08/21/23   Nicholaus Almarie HERO, MD  Drospirenone -Estetrol (NEXTSTELLIS ) 3-14.2 MG TABS Take 1 tablet by mouth daily. 08/21/23   Nicholaus Almarie HERO, MD  metroNIDAZOLE  (FLAGYL ) 500 MG tablet Take 1 tablet (500 mg total) by mouth 2 (two) times daily. 10/29/23   Constant, Peggy, MD  oxyCODONE  (OXY IR/ROXICODONE ) 5 MG immediate release tablet Take 1 tablet (5 mg total) by mouth every 6 (six) hours as needed for severe pain (pain score 7-10) or breakthrough pain. 06/05/23   Zina Jerilynn LABOR, MD  valACYclovir  (VALTREX ) 500 MG tablet TAKE 1 TABLET BY MOUTH TWICE A DAY 01/06/24   Zina Jerilynn LABOR, MD    Allergies: Latex    Review of Systems  Genitourinary:  Positive for vaginal bleeding.    Updated Vital Signs BP (!) 152/82  (BP Location: Right Arm)   Pulse 99   Temp 98.4 F (36.9 C) (Oral)   Resp 16   Ht 5' 7 (1.702 m)   Wt 79.4 kg   SpO2 100%   BMI 27.41 kg/m   Physical Exam Vitals and nursing note reviewed. Exam conducted with a chaperone present.  Constitutional:      General: She is not in acute distress.    Appearance: She is well-developed.  HENT:     Head: Normocephalic and atraumatic.  Eyes:     Conjunctiva/sclera: Conjunctivae normal.  Cardiovascular:     Rate and Rhythm: Normal rate and regular rhythm.     Heart sounds: No murmur heard. Pulmonary:     Effort: Pulmonary effort is normal. No respiratory distress.     Breath sounds: Normal breath sounds.  Abdominal:     Palpations: Abdomen is soft.     Tenderness: There is abdominal tenderness. There is no right CVA tenderness or left CVA tenderness.     Comments: Mild suprapubic abdominal tenderness without rebound or guarding  Genitourinary:    Comments: RN Delanna Polite to chaperone pelvic exam  Patient with very mild amount of blood in the vaginal vault.  No abnormal vaginal discharge.  No cervical motion tenderness Musculoskeletal:        General: No swelling.     Cervical back: Neck supple.  Skin:    General: Skin is warm and dry.     Capillary Refill: Capillary refill takes less  than 2 seconds.  Neurological:     Mental Status: She is alert.  Psychiatric:        Mood and Affect: Mood normal.     (all labs ordered are listed, but only abnormal results are displayed) Labs Reviewed  CBC WITH DIFFERENTIAL/PLATELET - Abnormal; Notable for the following components:      Result Value   RBC 5.31 (*)    Hemoglobin 11.5 (*)    MCV 69.7 (*)    MCH 22.0 (*)    RDW 21.5 (*)    All other components within normal limits  URINALYSIS, ROUTINE W REFLEX MICROSCOPIC - Abnormal; Notable for the following components:   Specific Gravity, Urine >1.046 (*)    Ketones, ur TRACE (*)    Protein, ur 30 (*)    All other components within  normal limits  COMPREHENSIVE METABOLIC PANEL WITH GFR - Abnormal; Notable for the following components:   Glucose, Bld 106 (*)    All other components within normal limits  WET PREP, GENITAL  PREGNANCY, URINE  LIPASE, BLOOD  GC/CHLAMYDIA PROBE AMP ([place]) NOT AT [facility]    EKG: None  Radiology: No results found.   Procedures   Medications Ordered in the ED  ketorolac  (TORADOL ) 15 MG/ML injection 30 mg (30 mg Intramuscular Given 01/13/24 1704)  acetaminophen  (TYLENOL ) tablet 1,000 mg (1,000 mg Oral Given 01/13/24 1703)  oxyCODONE  (Oxy IR/ROXICODONE ) immediate release tablet 5 mg (5 mg Oral Given 01/13/24 1703)    Clinical Course as of 01/13/24 1912  Mon Jan 13, 2024  1752 Upon reevaluation, patient with improvement in pain with the Toradol  given. [AF]  1843 Upon reevaluation, patient still reports pain well-controlled with pain medications given. [AF]    Clinical Course User Index [AF] Veta Palma, PA-C                                 Medical Decision Making Amount and/or Complexity of Data Reviewed Labs: ordered.  Risk OTC drugs. Prescription drug management.     Differential diagnosis includes but is not limited to  inevitable abortion, incomplete abortion, septic abortion, subchorionic hemorrhage, ectopic pregnancy, blood loss anemia, UTI, menstrual bleeding, abnormal uterine bleeding  ED Course:  Upon initial evaluation, patient is well appearing, normal vitals aside from her elevated blood pressure 152/82.  She does have some mild suprapubic abdominal tenderness on exam without rebound or guarding.  No nausea or vomiting.  Pelvic exam she has a mild amount of blood in the vaginal vault, but no other abnormal vaginal discharge.  No cervical motion tenderness.  Main concern is her pain from her abdominal cramping.  Will give Toradol , Tylenol , oxycodone  for pain control.   Labs Ordered: I Ordered, and personally interpreted labs.  The pertinent results  include:   CBC without leukocytosis.  Hemoglobin at 10.5, at baseline CMP within normal limits.  No elevation in creatinine or LFTs. Lipase within normal limits Urinalysis without specific gravity.  No signs of infection.  Red blood cells noted, consistent with patient being on menstrual cycle.  Medications Given: Toradol  Tylenol  Oxycodone   Upon re-evaluation, patient reports pain has improved with the medications given.  She feels like she can manage pain at home now.  Her labs are reassuring.  No abnormalities in lipase, LFTs, creatinine, no leukocytosis, low concern for acute intra-abdominal pathology.  Cramping consistent with menstrual cramping per patient.  Her hemoglobin is stable at 11.5 which  is at her baseline.  No significant vaginal bleeding on exam, bleeding consistent with normal menstrual cycle.  Urinalysis without evidence of infection.  Her wet prep is negative for yeast, trichomoniasis, BV.  Did request testing for gonorrhea chlamydia, but denies any known exposures.  No indication for prophylactic treatment.  Patient stable and appropriate for discharge home.  Impression: Menstrual cramps  Disposition:  The patient was discharged home with instructions to take Tylenol  as needed for pain.  Heating packs for pain.  Asked her to reach out to her surgeon who performed her gastric sleeve to see if they would be okay with her taking sparing ibuprofen  since the Toradol  here helped with pain, but hold off on ibuprofen  or other NSAIDs for now. Return precautions given and patient verbalized understanding.    This chart was dictated using voice recognition software, Dragon. Despite the best efforts of this provider to proofread and correct errors, errors may still occur which can change documentation meaning.       Final diagnoses:  Menstrual cramps    ED Discharge Orders     None          Veta Palma, DEVONNA 01/13/24 1912    Freddi Hamilton, MD 01/13/24  [phone]

## 2024-01-14 LAB — GC/CHLAMYDIA PROBE AMP (~~LOC~~) NOT AT ARMC
Chlamydia: NEGATIVE
Comment: NEGATIVE
Comment: NORMAL
Neisseria Gonorrhea: NEGATIVE

## 2024-01-15 ENCOUNTER — Telehealth: Payer: Self-pay

## 2024-01-15 ENCOUNTER — Ambulatory Visit: Payer: MEDICAID

## 2024-01-20 ENCOUNTER — Ambulatory Visit: Payer: MEDICAID

## 2024-02-17 ENCOUNTER — Encounter (HOSPITAL_COMMUNITY): Payer: Self-pay

## 2024-02-17 ENCOUNTER — Other Ambulatory Visit: Payer: Self-pay

## 2024-02-17 ENCOUNTER — Ambulatory Visit (HOSPITAL_COMMUNITY)
Admission: EM | Admit: 2024-02-17 | Discharge: 2024-02-18 | Disposition: A | Payer: MEDICAID | Attending: Family | Admitting: Family

## 2024-02-17 DIAGNOSIS — W2209XA Striking against other stationary object, initial encounter: Secondary | ICD-10-CM | POA: Insufficient documentation

## 2024-02-17 DIAGNOSIS — Z79899 Other long term (current) drug therapy: Secondary | ICD-10-CM | POA: Diagnosis not present

## 2024-02-17 DIAGNOSIS — F319 Bipolar disorder, unspecified: Secondary | ICD-10-CM | POA: Insufficient documentation

## 2024-02-17 DIAGNOSIS — F259 Schizoaffective disorder, unspecified: Secondary | ICD-10-CM | POA: Diagnosis not present

## 2024-02-17 DIAGNOSIS — Z91128 Patient's intentional underdosing of medication regimen for other reason: Secondary | ICD-10-CM | POA: Diagnosis not present

## 2024-02-17 DIAGNOSIS — F600 Paranoid personality disorder: Secondary | ICD-10-CM | POA: Insufficient documentation

## 2024-02-17 DIAGNOSIS — F22 Delusional disorders: Secondary | ICD-10-CM

## 2024-02-17 LAB — CBC WITH DIFFERENTIAL/PLATELET
Abs Immature Granulocytes: 0.02 K/uL (ref 0.00–0.07)
Basophils Absolute: 0 K/uL (ref 0.0–0.1)
Basophils Relative: 1 %
Eosinophils Absolute: 0.1 K/uL (ref 0.0–0.5)
Eosinophils Relative: 1 %
HCT: 40.2 % (ref 36.0–46.0)
Hemoglobin: 12.7 g/dL (ref 12.0–15.0)
Immature Granulocytes: 0 %
Lymphocytes Relative: 31 %
Lymphs Abs: 2.6 K/uL (ref 0.7–4.0)
MCH: 22.5 pg — ABNORMAL LOW (ref 26.0–34.0)
MCHC: 31.6 g/dL (ref 30.0–36.0)
MCV: 71.3 fL — ABNORMAL LOW (ref 80.0–100.0)
Monocytes Absolute: 0.9 K/uL (ref 0.1–1.0)
Monocytes Relative: 11 %
Neutro Abs: 4.8 K/uL (ref 1.7–7.7)
Neutrophils Relative %: 56 %
Platelets: 293 K/uL (ref 150–400)
RBC: 5.64 MIL/uL — ABNORMAL HIGH (ref 3.87–5.11)
RDW: 18.8 % — ABNORMAL HIGH (ref 11.5–15.5)
WBC: 8.4 K/uL (ref 4.0–10.5)
nRBC: 0 % (ref 0.0–0.2)

## 2024-02-17 LAB — TSH: TSH: 1.94 u[IU]/mL (ref 0.350–4.500)

## 2024-02-17 LAB — COMPREHENSIVE METABOLIC PANEL WITH GFR
ALT: 34 U/L (ref 0–44)
AST: 53 U/L — ABNORMAL HIGH (ref 15–41)
Albumin: 4.8 g/dL (ref 3.5–5.0)
Alkaline Phosphatase: 96 U/L (ref 38–126)
Anion gap: 15 (ref 5–15)
BUN: 10 mg/dL (ref 6–20)
CO2: 24 mmol/L (ref 22–32)
Calcium: 9.8 mg/dL (ref 8.9–10.3)
Chloride: 100 mmol/L (ref 98–111)
Creatinine, Ser: 0.7 mg/dL (ref 0.44–1.00)
GFR, Estimated: >60 mL/min
Glucose, Bld: 97 mg/dL (ref 70–99)
Potassium: 4 mmol/L (ref 3.5–5.1)
Sodium: 139 mmol/L (ref 135–145)
Total Bilirubin: 0.8 mg/dL (ref 0.0–1.2)
Total Protein: 8.1 g/dL (ref 6.5–8.1)

## 2024-02-17 LAB — POCT URINE DRUG SCREEN - MANUAL ENTRY (I-SCREEN)
POC Amphetamine UR: NOT DETECTED
POC Buprenorphine (BUP): NOT DETECTED
POC Cocaine UR: NOT DETECTED
POC Marijuana UR: POSITIVE — AB
POC Methadone UR: NOT DETECTED
POC Methamphetamine UR: NOT DETECTED
POC Morphine: NOT DETECTED
POC Oxazepam (BZO): NOT DETECTED
POC Oxycodone UR: NOT DETECTED
POC Secobarbital (BAR): NOT DETECTED

## 2024-02-17 LAB — HEMOGLOBIN A1C
Hgb A1c MFr Bld: 5.2 % (ref 4.8–5.6)
Mean Plasma Glucose: 102.54 mg/dL

## 2024-02-17 LAB — MAGNESIUM: Magnesium: 2 mg/dL (ref 1.7–2.4)

## 2024-02-17 LAB — ETHANOL: Alcohol, Ethyl (B): <15 mg/dL

## 2024-02-17 LAB — POC URINE PREG, ED: Preg Test, Ur: NEGATIVE

## 2024-02-17 MED ORDER — DIPHENHYDRAMINE HCL 50 MG PO CAPS
50.0000 mg | ORAL_CAPSULE | Freq: Three times a day (TID) | ORAL | Status: DC | PRN
  Filled 2024-02-17: qty 1

## 2024-02-17 MED ORDER — LORAZEPAM 2 MG/ML IJ SOLN: 2.0000 mg | Freq: Three times a day (TID) | INTRAMUSCULAR | Status: DC | PRN

## 2024-02-17 MED ORDER — ARIPIPRAZOLE 10 MG PO TABS
10.0000 mg | ORAL_TABLET | Freq: Every day | ORAL | Status: DC
  Administered 2024-02-17 – 2024-02-18 (×2): 10 mg via ORAL
  Filled 2024-02-17 (×2): qty 1

## 2024-02-17 MED ORDER — DIPHENHYDRAMINE HCL 50 MG/ML IJ SOLN: 50.0000 mg | Freq: Three times a day (TID) | INTRAMUSCULAR | Status: DC | PRN

## 2024-02-17 MED ORDER — HALOPERIDOL LACTATE 5 MG/ML IJ SOLN: 10.0000 mg | Freq: Three times a day (TID) | INTRAMUSCULAR | Status: DC | PRN

## 2024-02-17 MED ORDER — HALOPERIDOL LACTATE 5 MG/ML IJ SOLN: 5.0000 mg | Freq: Three times a day (TID) | INTRAMUSCULAR | Status: DC | PRN

## 2024-02-17 MED ORDER — MAGNESIUM HYDROXIDE 400 MG/5ML PO SUSP: 30.0000 mL | Freq: Every day | ORAL | Status: DC | PRN

## 2024-02-17 MED ORDER — HALOPERIDOL 5 MG PO TABS
5.0000 mg | ORAL_TABLET | Freq: Three times a day (TID) | ORAL | Status: DC | PRN
  Filled 2024-02-17: qty 1

## 2024-02-17 MED ORDER — ALUM & MAG HYDROXIDE-SIMETH 200-200-20 MG/5ML PO SUSP: 30.0000 mL | ORAL | Status: DC | PRN

## 2024-02-17 MED ORDER — LORAZEPAM 1 MG PO TABS
1.0000 mg | ORAL_TABLET | Freq: Once | ORAL | Status: AC
  Administered 2024-02-17: 1 mg via ORAL
  Filled 2024-02-17: qty 1

## 2024-02-17 MED ORDER — ACETAMINOPHEN 325 MG PO TABS: 650.0000 mg | ORAL_TABLET | Freq: Four times a day (QID) | ORAL | Status: DC | PRN

## 2024-02-17 NOTE — ED Notes (Signed)
 Patient is resting quietly in bed with eyes closed at this time, respirations even and unlabored, no distress noted. Will continue to monitor for safety

## 2024-02-17 NOTE — ED Notes (Addendum)
 Patient admitted to obs unit. Patient presents disorganized and labile. Patient easily irritable and confrontational. Patient initially refused to come into unit and refused skin check. Patient refused haldol  and benadryl  but was compliant with ativan  po prn and her scheduled abilify . Patient then agreed with skin check and lab work and was admitted into obs flex area. Patient continued to refuse EKG despite de-escalation. Patient currently in flex area and provided with meal. Patient remains safe on unit with no s/s of current distress.
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
## 2024-02-17 NOTE — Progress Notes (Signed)
 Pt has been accepted to [facility] on 02/17/2024 . Bed assignment: Main campus  Pt meets inpatient criteria per Ellouise Dawn, NP   Attending Physician will be Millie Manners, MD  Report can be called to: [phone] (this is a pager, please leave call-back number when giving report)  Pt can arrive after 8 AM on 1/13  Care Team Notified:Zaira [place], [place]
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
## 2024-02-17 NOTE — Progress Notes (Signed)
 LCSW Progress Note  [id number]   Danielle [name]  02/17/2024  2:53 PM  Description:   Inpatient Psychiatric Referral  Patient was recommended inpatient per  Ellouise Dawn (NP). There are no available beds at [facility], per [facility] Carlo RN). Patient was referred to the following out of network facilities:   [facility] Provider Address Phone Fax  [facility]  [address] [place] [address] [phone]  [facility]  [address] [place], [place] [address] [phone]  [facility]  [address] [place] [address] [phone]  [facility]  [address] [place] [address] [phone]  [facility]  [address], [place] [address] [phone]  [facility] EFAX  [address] [place], [place] [phone]      Situation ongoing, CSW to continue following and update chart as more information becomes available.     [name], MSW, LCSW  02/17/2024 2:53 PM

## 2024-02-17 NOTE — ED Notes (Signed)
 Patient has been resting in bed with eyes closed, easily aroused and cooperative with task when awakened. Patient has no complaints at this time, respirations are even and unlabored, no distress note. Will continue to monitor for safety
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
## 2024-02-18 NOTE — ED Notes (Signed)
 Introduced self to pt. Pt labile and animated. Speech is rapid,Loud and pressured during engagement. Shower offered. Appears sexually pre-occupied as she walked towards the shower room she started to undress before actually entering the room. Writer re-directed her attention as there was a female pt as well on the unit and her response was I wasn't gonna do it and then she entered the bathroom leaving the door open. Writer called her attention about leaving the door open with female presence and she slammed the door closed. Pt is accepted to [facility]. Sheriff will transfer as she has been IVC'd. Will continue to monitor behavior and report changes as noted. Safety maintained

## 2024-02-18 NOTE — ED Notes (Signed)
 [facility] department called for transport.

## 2024-02-18 NOTE — ED Notes (Signed)
 Patient resting in bed quietly with eyes closed, respirations even and unlabored, no distress noted, will continue to monitor

## 2024-02-18 NOTE — ED Provider Notes (Signed)
 Behavioral Health Progress Note  Date and Time: 02/18/2024 8:56 AM Name: Danielle [name] MRN:  [id number]  Subjective: Patient states asking me crazy questions!  You are all triggering me, triggering me, triggering me.  Patient reports feeling triggered when this writer asked if she were able to sleep last night.  Patient reports frustration that she has not been permitted to call her children before school.  Unit phones activated to allow patient to call her children prior to them leaving for school.  Danielle [name] is a 39 year old female who presented to [facility] behavioral health on 02/17/2024 via patent examiner.  History of PTSD and schizoaffective disorder.  Involuntary commitment petition initiated by patient's aunt after patient reportedly posted nude videos of herself online and made a statement that today may end in suicide by cop.  Patient endorses sleeping approximately 8 hours last night.  She endorses average appetite.  Reports energy level is good.  Danielle [name] continues to deny SI/HI/AVH.  Patient explains that on yesterday she made statements that today could end in suicide by cop only after law enforcement arrived at her home with involuntary commitment petition.  Patient states I just said that, I did not plan to complete suicide by cop.  Patient is reassessed by this nurse practitioner face-to-face.  She is seated on the floor completing coloring pages upon my approach.  She is alert and oriented, cooperative during assessment.  She presents with anxious mood, labile affect.  Svea continues to present hypo manic.  Current plan includes inpatient psychiatric hospitalization for treatment and stabilization.  Patient has been accepted to [facility] and will be transported there later this date.  Reviewed treatment plan with patient who verbalizes understanding.     Diagnosis:  Final diagnoses:  Paranoia (HCC)  Schizoaffective disorder, unspecified type (HCC)     Total Time spent with patient: 20 minutes  Past Psychiatric History: Schizoaffective disorder, PTSD, depression and anxiety  Past Medical History: Endometriosis, headache, hypertension, hypothyroidism, menorrhagia   Family History: None reported   Social History: Resides in [place] with two children ages 91 and 33 yo. Employed as Secondary School Teacher. Endorses alcohol use, average 1 drink, 2 days per week. Denies substance use aside from alcohol.  Additional Social History:    Pain Medications: See MAR Prescriptions: See MAR Over the Counter: See MAR Longest period of sobriety (when/how long): UTA, d/t level of agitation and demand to be discharged.                    Sleep: Good  Appetite:  Good  Current Medications:  Current Facility-Administered Medications  Medication Dose Route Frequency Provider Last Rate Last Admin   acetaminophen  (TYLENOL ) tablet 650 mg  650 mg Oral Q6H PRN Cathalina Barcia L, FNP       alum & mag hydroxide-simeth (MAALOX/MYLANTA) 200-200-20 MG/5ML suspension 30 mL  30 mL Oral Q4H PRN Sheriece Jefcoat L, FNP       ARIPiprazole  (ABILIFY ) tablet 10 mg  10 mg Oral Daily Dasie Ellouise CROME, FNP   10 mg at 02/17/24 1401   haloperidol  (HALDOL ) tablet 5 mg  5 mg Oral TID PRN Dasie Ellouise CROME, FNP       And   diphenhydrAMINE  (BENADRYL ) capsule 50 mg  50 mg Oral TID PRN Kamaya Keckler L, FNP       haloperidol  lactate (HALDOL ) injection 5 mg  5 mg Intramuscular TID PRN Dasie Ellouise CROME, FNP       And  diphenhydrAMINE  (BENADRYL ) injection 50 mg  50 mg Intramuscular TID PRN Dasie Ellouise CROME, FNP       And   LORazepam  (ATIVAN ) injection 2 mg  2 mg Intramuscular TID PRN Dasie Ellouise CROME, FNP       haloperidol  lactate (HALDOL ) injection 10 mg  10 mg Intramuscular TID PRN Dasie Ellouise CROME, FNP       And   diphenhydrAMINE  (BENADRYL ) injection 50 mg  50 mg Intramuscular TID PRN Dasie Ellouise CROME, FNP       And   LORazepam  (ATIVAN ) injection 2 mg  2 mg Intramuscular TID PRN Bhavik Cabiness L,  FNP       magnesium  hydroxide (MILK OF MAGNESIA) suspension 30 mL  30 mL Oral Daily PRN Dasie Ellouise CROME, FNP       Current Outpatient Medications  Medication Sig Dispense Refill   SIMPESSE 0.15-0.03 &0.01 MG tablet Take 1 tablet by mouth daily.     ARIPiprazole  ER (ABILIFY  MAINTENA) 400 MG PRSY prefilled syringe Inject 400 mg into the muscle.     oxyCODONE  (OXY IR/ROXICODONE ) 5 MG immediate release tablet Take 1 tablet (5 mg total) by mouth every 6 (six) hours as needed for severe pain (pain score 7-10) or breakthrough pain. 12 tablet 0    Labs  Lab Results:  Admission on 02/17/2024  Component Date Value Ref Range Status   WBC 02/17/2024 8.4  4.0 - 10.5 K/uL Final   RBC 02/17/2024 5.64 (H)  3.87 - 5.11 MIL/uL Final   Hemoglobin 02/17/2024 12.7  12.0 - 15.0 g/dL Final   HCT 98/87/7973 40.2  36.0 - 46.0 % Final   MCV 02/17/2024 71.3 (L)  80.0 - 100.0 fL Final   MCH 02/17/2024 22.5 (L)  26.0 - 34.0 pg Final   MCHC 02/17/2024 31.6  30.0 - 36.0 g/dL Final   RDW 98/87/7973 18.8 (H)  11.5 - 15.5 % Final   Platelets 02/17/2024 293  150 - 400 K/uL Final   REPEATED TO VERIFY   nRBC 02/17/2024 0.0  0.0 - 0.2 % Final   Neutrophils Relative % 02/17/2024 56  % Final   Neutro Abs 02/17/2024 4.8  1.7 - 7.7 K/uL Final   Lymphocytes Relative 02/17/2024 31  % Final   Lymphs Abs 02/17/2024 2.6  0.7 - 4.0 K/uL Final   Monocytes Relative 02/17/2024 11  % Final   Monocytes Absolute 02/17/2024 0.9  0.1 - 1.0 K/uL Final   Eosinophils Relative 02/17/2024 1  % Final   Eosinophils Absolute 02/17/2024 0.1  0.0 - 0.5 K/uL Final   Basophils Relative 02/17/2024 1  % Final   Basophils Absolute 02/17/2024 0.0  0.0 - 0.1 K/uL Final   Immature Granulocytes 02/17/2024 0  % Final   Abs Immature Granulocytes 02/17/2024 0.02  0.00 - 0.07 K/uL Final   Performed at [facility], [address] [place], [place] [address]   Sodium 02/17/2024 139  135 - 145 mmol/L Final   Potassium 02/17/2024 4.0  3.5 - 5.1 mmol/L  Final   Chloride 02/17/2024 100  98 - 111 mmol/L Final   CO2 02/17/2024 24  22 - 32 mmol/L Final   Glucose, Bld 02/17/2024 97  70 - 99 mg/dL Final   Glucose reference range applies only to samples taken after fasting for at least 8 hours.   BUN 02/17/2024 10  6 - 20 mg/dL Final   Creatinine, Ser 02/17/2024 0.70  0.44 - 1.00 mg/dL Final   Calcium 98/87/7973 9.8  8.9 - 10.3 mg/dL Final   Total Protein 98/87/7973 8.1  6.5 - 8.1 g/dL Final   Albumin 98/87/7973 4.8  3.5 - 5.0 g/dL Final   AST 98/87/7973 53 (H)  15 - 41 U/L Final   HEMOLYSIS AT THIS LEVEL MAY AFFECT RESULT   ALT 02/17/2024 34  0 - 44 U/L Final   Alkaline Phosphatase 02/17/2024 96  38 - 126 U/L Final   Total Bilirubin 02/17/2024 0.8  0.0 - 1.2 mg/dL Final   GFR, Estimated 02/17/2024 >60  >60 mL/min Final   Comment: (NOTE) Calculated using the CKD-EPI Creatinine Equation (2021)    Anion gap 02/17/2024 15  5 - 15 Final   Performed at [facility], [address] [place], [place] [address]   Hgb A1c MFr Bld 02/17/2024 5.2  4.8 - 5.6 % Final   Comment: (NOTE) Diagnosis of Diabetes The following HbA1c ranges recommended by the American Diabetes Association (ADA) may be used as an aid in the diagnosis of diabetes mellitus.  Hemoglobin             Suggested A1C NGSP%              Diagnosis  <5.7                   Non Diabetic  5.7-6.4                Pre-Diabetic  >6.4                   Diabetic  <7.0                   Glycemic control for                       adults with diabetes.     Mean Plasma Glucose 02/17/2024 102.54  mg/dL Final   Performed at [facility], [address], [place], [place] [address]   Magnesium  02/17/2024 2.0  1.7 - 2.4 mg/dL Final   Performed at [facility], [address], [place], [place] [address]   Alcohol, Ethyl (B) 02/17/2024 <15  <15 mg/dL Final   Comment: (NOTE) For medical purposes only. Performed at [facility], [address] [place],  [place] [address]    TSH 02/17/2024 1.940  0.350 - 4.500 uIU/mL Final   Performed at [facility], [address], [place], [place] [address]   Preg Test, Ur 02/17/2024 Negative  Negative Final   POC Amphetamine UR 02/17/2024 None Detected  NONE DETECTED (Cut Off Level 1000 ng/mL) Final   POC Secobarbital (BAR) 02/17/2024 None Detected  NONE DETECTED (Cut Off Level 300 ng/mL) Final   POC Buprenorphine (BUP) 02/17/2024 None Detected  NONE DETECTED (Cut Off Level 10 ng/mL) Final   POC Oxazepam (BZO) 02/17/2024 None Detected  NONE DETECTED (Cut Off Level 300 ng/mL) Final   POC Cocaine UR 02/17/2024 None Detected  NONE DETECTED (Cut Off Level 300 ng/mL) Final   POC Methamphetamine UR 02/17/2024 None Detected  NONE DETECTED (Cut Off Level 1000 ng/mL) Final   POC Morphine  02/17/2024 None Detected  NONE DETECTED (Cut Off Level 300 ng/mL) Final   POC Methadone UR 02/17/2024 None Detected  NONE DETECTED (Cut Off Level 300 ng/mL) Final   POC Oxycodone  UR 02/17/2024 None Detected  NONE DETECTED (Cut Off Level 100 ng/mL) Final   POC Marijuana UR 02/17/2024 Positive (A)  NONE DETECTED (Cut Off Level 50 ng/mL) Final  Admission on 01/13/2024, Discharged on 01/13/2024  Component Date Value Ref Range Status   WBC 01/13/2024 6.9  4.0 - 10.5 K/uL Final   RBC 01/13/2024 5.31 (H)  3.87 - 5.11 MIL/uL Final   Hemoglobin 01/13/2024 11.5 (L)  12.0 - 15.0 g/dL Final   HCT 87/91/7974 37.0  36.0 - 46.0 % Final   MCV 01/13/2024 69.7 (L)  80.0 - 100.0 fL Final   MCH 01/13/2024 22.0 (L)  26.0 - 34.0 pg Final   MCHC 01/13/2024 31.5  30.0 - 36.0 g/dL Final   RDW 87/91/7974 21.5 (H)  11.5 - 15.5 % Final   Platelets 01/13/2024 237  150 - 400 K/uL Final   REPEATED TO VERIFY   nRBC 01/13/2024 0.0  0.0 - 0.2 % Final   Neutrophils Relative % 01/13/2024 56  % Final   Neutro Abs 01/13/2024 3.6  1.7 - 7.7 K/uL Final   Lymphocytes Relative 01/13/2024 35  % Final   Lymphs Abs 01/13/2024 2.3  0.7 - 4.0 K/uL Final   Monocytes  Relative 01/13/2024 8  % Final   Monocytes Absolute 01/13/2024 0.5  0.1 - 1.0 K/uL Final   Eosinophils Relative 01/13/2024 1  % Final   Eosinophils Absolute 01/13/2024 0.1  0.0 - 0.5 K/uL Final   Basophils Relative 01/13/2024 0  % Final   Basophils Absolute 01/13/2024 0.0  0.0 - 0.1 K/uL Final   WBC Morphology 01/13/2024 MORPHOLOGY UNREMARKABLE   Final   Smear Review 01/13/2024 Normal platelet morphology   Final   Immature Granulocytes 01/13/2024 0  % Final   Abs Immature Granulocytes 01/13/2024 0.01  0.00 - 0.07 K/uL Final   Ovalocytes 01/13/2024 PRESENT   Final   Performed at [facility], [address] [place], [place] [address]   Color, Urine 01/13/2024 YELLOW  YELLOW Final   APPearance 01/13/2024 CLEAR  CLEAR Final   Specific Gravity, Urine 01/13/2024 >1.046 (H)  1.005 - 1.030 Final   pH 01/13/2024 6.0  5.0 - 8.0 Final   Glucose, UA 01/13/2024 NEGATIVE  NEGATIVE mg/dL Final   Hgb urine dipstick 01/13/2024 NEGATIVE  NEGATIVE Final   Bilirubin Urine 01/13/2024 NEGATIVE  NEGATIVE Final   Ketones, ur 01/13/2024 TRACE (A)  NEGATIVE mg/dL Final   Protein, ur 87/91/7974 30 (A)  NEGATIVE mg/dL Final   Nitrite 87/91/7974 NEGATIVE  NEGATIVE Final   Leukocytes,Ua 01/13/2024 NEGATIVE  NEGATIVE Final   RBC / HPF 01/13/2024 6-10  0 - 5 RBC/hpf Final   WBC, UA 01/13/2024 11-20  0 - 5 WBC/hpf Final   Bacteria, UA 01/13/2024 NONE SEEN  NONE SEEN Final   Squamous Epithelial / HPF 01/13/2024 0-5  0 - 5 /HPF Final   Mucus 01/13/2024 PRESENT   Final   Hyaline Casts, UA 01/13/2024 PRESENT   Final   Ca Oxalate Crys, UA 01/13/2024 PRESENT   Final   Performed at [facility], [address], [place], [place] [address]   Preg Test, Ur 01/13/2024 NEGATIVE  NEGATIVE Final   Comment:        THE SENSITIVITY OF THIS METHODOLOGY IS >20 mIU/mL. Performed at [facility], [address] [place], [place] [address]    Sodium 01/13/2024 141  135 -  145 mmol/L Final   Potassium 01/13/2024 3.7  3.5 - 5.1 mmol/L Final   Chloride 01/13/2024 106  98 - 111 mmol/L Final   CO2 01/13/2024 23  22 - 32 mmol/L Final  Glucose, Bld 01/13/2024 106 (H)  70 - 99 mg/dL Final   Glucose reference range applies only to samples taken after fasting for at least 8 hours.   BUN 01/13/2024 12  6 - 20 mg/dL Final   Creatinine, Ser 01/13/2024 0.69  0.44 - 1.00 mg/dL Final   Calcium 87/91/7974 9.9  8.9 - 10.3 mg/dL Final   Total Protein 87/91/7974 7.5  6.5 - 8.1 g/dL Final   Albumin 87/91/7974 4.2  3.5 - 5.0 g/dL Final   AST 87/91/7974 22  15 - 41 U/L Final   ALT 01/13/2024 14  0 - 44 U/L Final   Alkaline Phosphatase 01/13/2024 88  38 - 126 U/L Final   Total Bilirubin 01/13/2024 0.4  0.0 - 1.2 mg/dL Final   GFR, Estimated 01/13/2024 >60  >60 mL/min Final   Comment: (NOTE) Calculated using the CKD-EPI Creatinine Equation (2021)    Anion gap 01/13/2024 12  5 - 15 Final   Performed at [facility], [address], [place], [place] [address]   Lipase 01/13/2024 25  11 - 51 U/L Final   Performed at [facility], [address] [place], [place] [address]   Yeast Wet Prep HPF POC 01/13/2024 NONE SEEN  NONE SEEN Final   Trich, Wet Prep 01/13/2024 NONE SEEN  NONE SEEN Final   Clue Cells Wet Prep HPF POC 01/13/2024 NONE SEEN  NONE SEEN Final   WBC, Wet Prep HPF POC 01/13/2024 <10  <10 Final   Sperm 01/13/2024 NONE SEEN   Final   Performed at [facility], [address], [place], [place] [address]   Neisseria Gonorrhea 01/13/2024 Negative   Final   Chlamydia 01/13/2024 Negative   Final   Comment 01/13/2024 Normal Reference Ranger Chlamydia - Negative   Final   Comment 01/13/2024 Normal Reference Range Neisseria Gonorrhea - Negative   Final  Admission on 11/22/2023, Discharged on 11/22/2023  Component Date Value Ref Range Status   Sodium 11/22/2023 138  135 - 145 mmol/L Final   Potassium 11/22/2023 3.6   3.5 - 5.1 mmol/L Final   Chloride 11/22/2023 104  98 - 111 mmol/L Final   CO2 11/22/2023 23  22 - 32 mmol/L Final   Glucose, Bld 11/22/2023 97  70 - 99 mg/dL Final   Glucose reference range applies only to samples taken after fasting for at least 8 hours.   BUN 11/22/2023 9  6 - 20 mg/dL Final   Creatinine, Ser 11/22/2023 0.52  0.44 - 1.00 mg/dL Final   Calcium 89/82/7974 9.2  8.9 - 10.3 mg/dL Final   Total Protein 89/82/7974 7.3  6.5 - 8.1 g/dL Final   Albumin 89/82/7974 4.1  3.5 - 5.0 g/dL Final   AST 89/82/7974 19  15 - 41 U/L Final   ALT 11/22/2023 10  0 - 44 U/L Final   Alkaline Phosphatase 11/22/2023 71  38 - 126 U/L Final   Total Bilirubin 11/22/2023 0.3  0.0 - 1.2 mg/dL Final   GFR, Estimated 11/22/2023 >60  >60 mL/min Final   Comment: (NOTE) Calculated using the CKD-EPI Creatinine Equation (2021)    Anion gap 11/22/2023 11  5 - 15 Final   Performed at [facility], [address] [place], [place] [address]   WBC 11/22/2023 5.0  4.0 - 10.5 K/uL Final   RBC 11/22/2023 5.06  3.87 - 5.11 MIL/uL Final   Hemoglobin 11/22/2023 10.6 (L)  12.0 - 15.0 g/dL Final   HCT 89/82/7974 35.1 (  L)  36.0 - 46.0 % Final   MCV 11/22/2023 69.4 (L)  80.0 - 100.0 fL Final   MCH 11/22/2023 20.9 (L)  26.0 - 34.0 pg Final   MCHC 11/22/2023 30.2  30.0 - 36.0 g/dL Final   RDW 89/82/7974 18.6 (H)  11.5 - 15.5 % Final   Platelets 11/22/2023 191  150 - 400 K/uL Final   REPEATED TO VERIFY   nRBC 11/22/2023 0.0  0.0 - 0.2 % Final   Neutrophils Relative % 11/22/2023 49  % Final   Neutro Abs 11/22/2023 2.5  1.7 - 7.7 K/uL Final   Lymphocytes Relative 11/22/2023 41  % Final   Lymphs Abs 11/22/2023 2.1  0.7 - 4.0 K/uL Final   Monocytes Relative 11/22/2023 8  % Final   Monocytes Absolute 11/22/2023 0.4  0.1 - 1.0 K/uL Final   Eosinophils Relative 11/22/2023 1  % Final   Eosinophils Absolute 11/22/2023 0.1  0.0 - 0.5 K/uL Final   Basophils Relative 11/22/2023 1  % Final   Basophils  Absolute 11/22/2023 0.0  0.0 - 0.1 K/uL Final   Immature Granulocytes 11/22/2023 0  % Final   Abs Immature Granulocytes 11/22/2023 0.01  0.00 - 0.07 K/uL Final   Performed at [facility], [address] [place], [place] [address]  Admission on 11/22/2023, Discharged on 11/22/2023  Component Date Value Ref Range Status   Color, UA 11/22/2023 yellow  yellow Final   Clarity, UA 11/22/2023 cloudy (A)  clear Final   Glucose, UA 11/22/2023 negative  negative mg/dL Final   Bilirubin, UA 89/82/7974 negative  negative Final   Ketones, POC UA 11/22/2023 negative  negative mg/dL Final   Spec Grav, UA 89/82/7974 >=1.030 (A)  1.010 - 1.025 Final   Blood, UA 11/22/2023 trace-intact (A)  negative Final   pH, UA 11/22/2023 5.5  5.0 - 8.0 Final   Protein Ur, POC 11/22/2023 =30 (A)  negative mg/dL Final   Urobilinogen, UA 11/22/2023 0.2  0.2 or 1.0 E.U./dL Final   Nitrite, UA 89/82/7974 Negative  Negative Final   Leukocytes, UA 11/22/2023 Negative  Negative Final   Preg Test, Ur 11/22/2023 Negative  Negative Final  Clinical Support on 10/28/2023  Component Date Value Ref Range Status   Neisseria Gonorrhea 10/28/2023 Negative   Final   Chlamydia 10/28/2023 Negative   Final   Trichomonas 10/28/2023 Negative   Final   Bacterial Vaginitis (gardnerella) 10/28/2023 Positive (A)   Final   Candida Vaginitis 10/28/2023 Negative   Final   Candida Glabrata 10/28/2023 Negative   Final   Comment 10/28/2023 Normal Reference Range Candida Species - Negative   Final   Comment 10/28/2023 Normal Reference Range Candida Galbrata - Negative   Final   Comment 10/28/2023 Normal Reference Range Trichomonas - Negative   Final   Comment 10/28/2023 Normal Reference Range Bacterial Vaginosis - Negative   Final   Comment 10/28/2023 Normal Reference Ranger Chlamydia - Negative   Final   Comment 10/28/2023 Normal Reference Range Neisseria Gonorrhea - Negative   Final   Hepatitis B Surface Ag 10/28/2023  Negative  Negative Final   Hep C Virus Ab 10/28/2023 Non Reactive  Non Reactive Final   Comment: HCV antibody alone does not differentiate between previously resolved infection and active infection. Equivocal and Reactive HCV antibody results should be followed up with an HCV RNA test to support the diagnosis of active HCV infection.    HIV Screen 4th Generation wRfx 10/28/2023 Non Reactive  Non Reactive Final  Comment: HIV-1/HIV-2 antibodies and HIV-1 p24 antigen were NOT detected. There is no laboratory evidence of HIV infection. HIV Negative    RPR Ser Ql 10/28/2023 Non Reactive  Non Reactive Final   Preg Test, Ur 10/28/2023 Negative  Negative Final   Glucose, UA 10/28/2023 Negative  Negative Final   Bilirubin, UA 10/28/2023 negative   Final   Ketones, UA 10/28/2023 negative   Final   Spec Grav, UA 10/28/2023 1.025  1.010 - 1.025 Final   Blood, UA 10/28/2023 negative   Final   pH, UA 10/28/2023 6.0  5.0 - 8.0 Final   Protein, UA 10/28/2023 Positive (A)  Negative Final   Urobilinogen, UA 10/28/2023 0.2  0.2 or 1.0 E.U./dL Final   Nitrite, UA 90/77/7974 negative   Final   Leukocytes, UA 10/28/2023 Negative  Negative Final   Urine Culture, Routine 10/28/2023 Final report   Final   Organism ID, Bacteria 10/28/2023 No growth   Final  Office Visit on 08/21/2023  Component Date Value Ref Range Status   WBC 08/21/2023 7.2  3.4 - 10.8 x10E3/uL Final   RBC 08/21/2023 5.24  3.77 - 5.28 x10E6/uL Final   Hemoglobin 08/21/2023 12.0  11.1 - 15.9 g/dL Final   Hematocrit 92/83/7974 41.0  34.0 - 46.6 % Final   MCV 08/21/2023 78 (L)  79 - 97 fL Final   MCH 08/21/2023 22.9 (L)  26.6 - 33.0 pg Final   MCHC 08/21/2023 29.3 (L)  31.5 - 35.7 g/dL Final   RDW 92/83/7974 14.4  11.7 - 15.4 % Final   Platelets 08/21/2023 346  150 - 450 x10E3/uL Final   Neisseria Gonorrhea 08/21/2023 Negative   Final   Chlamydia 08/21/2023 Negative   Final   Trichomonas 08/21/2023 Negative   Final   Bacterial  Vaginitis (gardnerella) 08/21/2023 Positive (A)   Final   Comment 08/21/2023 Normal Reference Range Trichomonas - Negative   Final   Comment 08/21/2023 Normal Reference Range Bacterial Vaginosis - Negative   Final   Comment 08/21/2023 Normal Reference Ranger Chlamydia - Negative   Final   Comment 08/21/2023 Normal Reference Range Neisseria Gonorrhea - Negative   Final   High risk HPV 08/21/2023 Negative   Final   Adequacy 08/21/2023 Satisfactory for evaluation; transformation zone component PRESENT.   Final   Diagnosis 08/21/2023 - Negative for intraepithelial lesion or malignancy (NILM)   Final   Comment 08/21/2023 Normal Reference Range HPV - Negative   Final   HIV Screen 4th Generation wRfx 08/21/2023 Non Reactive  Non Reactive Final   Comment: HIV-1/HIV-2 antibodies and HIV-1 p24 antigen were NOT detected. There is no laboratory evidence of HIV infection. HIV Negative    RPR Ser Ql 08/21/2023 Non Reactive  Non Reactive Final   Interpretation: 08/21/2023 Comment   Final   Comment: Syphilis: RPR with Reflex to RPR Titer and Treponemal           Antibodies, Traditional Screening and Diagnosis           Algorithm ------------------------------------------------------------                        Treponemal RPR        RPR, Qn         Ab       Final Interpretation --------   ---------   ----------   ------------------------ Non        N/A         N/A  No laboratory evidence Reactive                            of syphilis. Retest in                                     2-4 weeks if recent                                     exposure is suspected. --------   ---------   ----------   ------------------------ Reactive   >/=1:1      Non          Nontreponemal antibodies                        Reactive     detected. Syphilis                                     unlikely; biological                                     false positive possible.                                     Retest in  2-4 weeks if                                     recent exposure                           is                                     suspected. --------   ---------   ----------   ------------------------ Reactive   >/=1:1      Reactive     Treponemal and                                     nontreponemal antibodies                                     detected. Consistent                                     with past or current                                     (potential early)  syphilis.    Hep C Virus Ab 08/21/2023 Non Reactive  Non Reactive Final   Comment: HCV antibody alone does not differentiate between previously resolved infection and active infection. Equivocal and Reactive HCV antibody results should be followed up with an HCV RNA test to support the diagnosis of active HCV infection.    Hepatitis B Surface Ag 08/21/2023 Negative  Negative Final    Blood Alcohol level:  Lab Results  Component Value Date   [facility] <15 02/17/2024   ETH <10 12/11/2017    Metabolic Disorder Labs: Lab Results  Component Value Date   HGBA1C 5.2 02/17/2024   MPG 102.54 02/17/2024   MPG 123 (H) 08/02/2014   No results found for: PROLACTIN No results found for: CHOL, TRIG, HDL, CHOLHDL, VLDL, LDLCALC  Therapeutic Lab Levels: No results found for: LITHIUM No results found for: VALPROATE No results found for: CBMZ  Physical Findings   AIMS    Flowsheet Row [facility]  AIMS Total Score 0   AUDIT    Flowsheet Row Ad[facility] ([facility]  Alcohol Use Disorder Identification Test Final Score (AUDIT) 1   GAD-7    Flowsheet Row Clinical Support from 03/28/2022 in [facility] for [facility] [place] Initial Prenatal from 08/17/2016 in [facility]  Total  GAD-7 Score 0 6   PHQ2-9    Flowsheet Row Clinical Support from 03/28/2022 in [facility] for [facility] [place] Initial Prenatal from 08/17/2016 in [facility]  PHQ-2 Total Score 0 0  PHQ-9 Total Score 0 2   Flowsheet Row ED from 02/17/2024 in [facility] ED from 01/13/2024 in [facility] at [facility] ED from 11/22/2023 in [facility] Emergency Department at [facility]  C-SSRS RISK CATEGORY No Risk No Risk No Risk     Musculoskeletal  Strength & Muscle Tone: within normal limits Gait & Station: normal Patient leans: N/A  Psychiatric Specialty Exam  Presentation  General Appearance:  Appropriate for Environment; Casual  Eye Contact: Good  Speech: Clear and Coherent; Normal Rate  Speech Volume: Normal  Handedness: Right   Mood and Affect  Mood: Anxious  Affect: Congruent   Thought Process  Thought Processes: Coherent; Goal Directed; Linear  Descriptions of Associations:Intact  Orientation:Full (Time, Place and Person)  Thought Content:Logical  Diagnosis of Schizophrenia or Schizoaffective disorder in past: Yes  Duration of Psychotic Symptoms: Less than six months   Hallucinations:Hallucinations: None  Ideas of Reference:None  Suicidal Thoughts:Suicidal Thoughts: No  Homicidal Thoughts:Homicidal Thoughts: No   Sensorium  Memory: Immediate Fair  Judgment: Impaired  Insight: Lacking   Executive Functions  Concentration: Poor  Attention Span: Poor  Recall: Good  Fund of Knowledge: Good  Language: Good   Psychomotor Activity  Psychomotor Activity: Psychomotor Activity: Normal   Assets  Assets: Communication Skills; Financial Resources/Insurance; Housing; Social Support   Sleep  Sleep: Sleep: Good  No Safety Checks orders active in given range  Nutritional Assessment (For [facility] and [facility] admissions only) Has the  patient had a weight loss or gain of 10 pounds or more in the last 3 months?: No Has the patient had a decrease in food intake/or appetite?: No Does the patient have dental problems?: No Does the patient have eating habits or behaviors that may be indicators of an eating disorder including binging or inducing  vomiting?: No Has the patient recently lost weight without trying?: 0 Has the patient been eating poorly because of a decreased appetite?: 0 Malnutrition Screening Tool Score: 0    Physical Exam  Physical Exam Vitals and nursing note reviewed.  Constitutional:      Appearance: Normal appearance. She is well-developed and normal weight.  HENT:     Head: Normocephalic and atraumatic.     Nose: Nose normal.  Cardiovascular:     Rate and Rhythm: Normal rate.  Pulmonary:     Effort: Pulmonary effort is normal.  Musculoskeletal:        General: Normal range of motion.     Cervical back: Normal range of motion.  Skin:    General: Skin is warm and dry.  Neurological:     Mental Status: She is alert and oriented to person, place, and time.  Psychiatric:        Attention and Perception: Attention and perception normal.        Mood and Affect: Mood is anxious. Affect is labile.        Speech: Speech normal.        Behavior: Behavior is cooperative.        Thought Content: Thought content normal.        Cognition and Memory: Cognition normal.    Review of Systems  Constitutional: Negative.   HENT: Negative.    Eyes: Negative.   Respiratory: Negative.    Cardiovascular: Negative.   Gastrointestinal: Negative.   Genitourinary: Negative.   Musculoskeletal: Negative.   Skin: Negative.   Neurological: Negative.   Psychiatric/Behavioral:  The patient is nervous/anxious.    Blood pressure 132/84, pulse 84, temperature 98.8 F (37.1 C), resp. rate 16, SpO2 100%. There is no height or weight on file to calculate BMI.  Treatment Plan Summary: Daily contact with patient to assess  and evaluate symptoms and progress in treatment and Plan Patient accepted to [facility] by Dr Millie Manners for inpatient psychiatric treatment/admission. She will be transported to [facility] later this date. IVC continues.   Schizoaffective disorder: Aripiprazole  10 mg daily  Additional medications: -Acetaminophen  650 mg every 6 hours, as needed/mild pain -Maalox 30 mL oral every 4 hours, as needed/digestion -Magnesium  hydroxide 30 mL daily as needed/mild constipation  Agitation protocol: MILD -Haloperidol  5 mg 3 times daily as needed mild agitation  -Diphenhydramine  50 mg p.o. 3 times daily as needed mild agitation  MODERATE -Haloperidol  5 mg IM 3 times daily as needed/moderate agitation -Diphenhydramine  50 mg IM 3 times daily as needed/moderate agitation -Lorazepam  2 mg IM 3 times daily as needed/moderate agitation  SEVERE -Haloperidol  10 mg IM 3 times daily as needed severe agitation -Diphenhydramine  50 mg IM 3 times daily as needed/severe agitation -Lorazepam  2 mg IM 3 times daily as needed/severe agitation  Ellouise LITTIE Dawn, FNP 02/18/2024 8:56 AM

## 2024-03-02 ENCOUNTER — Ambulatory Visit: Payer: MEDICAID | Admitting: Obstetrics and Gynecology

## 2024-03-02 ENCOUNTER — Ambulatory Visit: Payer: Self-pay | Admitting: Obstetrics and Gynecology
